# Patient Record
Sex: Female | Born: 1980 | Race: White | Hispanic: No | Marital: Married | Smoking: Never smoker
Health system: Southern US, Community
[De-identification: ages and names within clinical notes are randomized; demographics above are authoritative.]

## PROBLEM LIST (undated history)

## (undated) DIAGNOSIS — J45909 Unspecified asthma, uncomplicated: Secondary | ICD-10-CM

## (undated) DIAGNOSIS — E039 Hypothyroidism, unspecified: Secondary | ICD-10-CM

## (undated) DIAGNOSIS — Z8489 Family history of other specified conditions: Secondary | ICD-10-CM

## (undated) DIAGNOSIS — E079 Disorder of thyroid, unspecified: Secondary | ICD-10-CM

## (undated) DIAGNOSIS — G8929 Other chronic pain: Secondary | ICD-10-CM

## (undated) DIAGNOSIS — K589 Irritable bowel syndrome without diarrhea: Secondary | ICD-10-CM

## (undated) DIAGNOSIS — R51 Headache: Secondary | ICD-10-CM

## (undated) HISTORY — DX: Other chronic pain: G89.29

## (undated) HISTORY — PX: DIAGNOSTIC LAPAROSCOPY: SUR761

## (undated) HISTORY — DX: Disorder of thyroid, unspecified: E07.9

## (undated) HISTORY — DX: Headache: R51

## (undated) HISTORY — PX: OTHER SURGICAL HISTORY: SHX169

## (undated) HISTORY — DX: Irritable bowel syndrome, unspecified: K58.9

---

## 2003-02-05 ENCOUNTER — Other Ambulatory Visit: Admission: RE | Admit: 2003-02-05 | Discharge: 2003-02-05 | Payer: Self-pay | Admitting: Obstetrics and Gynecology

## 2003-08-11 ENCOUNTER — Other Ambulatory Visit: Admission: RE | Admit: 2003-08-11 | Discharge: 2003-08-11 | Payer: Self-pay | Admitting: Obstetrics and Gynecology

## 2004-05-11 ENCOUNTER — Other Ambulatory Visit: Admission: RE | Admit: 2004-05-11 | Discharge: 2004-05-11 | Payer: Self-pay | Admitting: Obstetrics and Gynecology

## 2004-11-16 ENCOUNTER — Other Ambulatory Visit: Admission: RE | Admit: 2004-11-16 | Discharge: 2004-11-16 | Payer: Self-pay | Admitting: Obstetrics and Gynecology

## 2005-06-16 ENCOUNTER — Other Ambulatory Visit: Admission: RE | Admit: 2005-06-16 | Discharge: 2005-06-16 | Payer: Self-pay | Admitting: Obstetrics and Gynecology

## 2006-07-18 ENCOUNTER — Other Ambulatory Visit: Admission: RE | Admit: 2006-07-18 | Discharge: 2006-07-18 | Payer: Self-pay | Admitting: Obstetrics & Gynecology

## 2007-08-16 ENCOUNTER — Other Ambulatory Visit: Admission: RE | Admit: 2007-08-16 | Discharge: 2007-08-16 | Payer: Self-pay | Admitting: Obstetrics and Gynecology

## 2007-09-09 ENCOUNTER — Emergency Department (HOSPITAL_COMMUNITY): Admission: EM | Admit: 2007-09-09 | Discharge: 2007-09-09 | Payer: Self-pay | Admitting: Emergency Medicine

## 2007-09-09 DIAGNOSIS — S99919A Unspecified injury of unspecified ankle, initial encounter: Secondary | ICD-10-CM

## 2007-09-09 DIAGNOSIS — S8990XA Unspecified injury of unspecified lower leg, initial encounter: Secondary | ICD-10-CM | POA: Insufficient documentation

## 2007-09-09 DIAGNOSIS — S99929A Unspecified injury of unspecified foot, initial encounter: Secondary | ICD-10-CM

## 2007-09-09 IMAGING — CR DG KNEE COMPLETE 4+V*R*
6 series · 6 of 6 positions shown · non-contrast
Comparison: none

CLINICAL DATA: Knee pain

RIGHT KNEE - 4 VIEW

[t knee ap right]
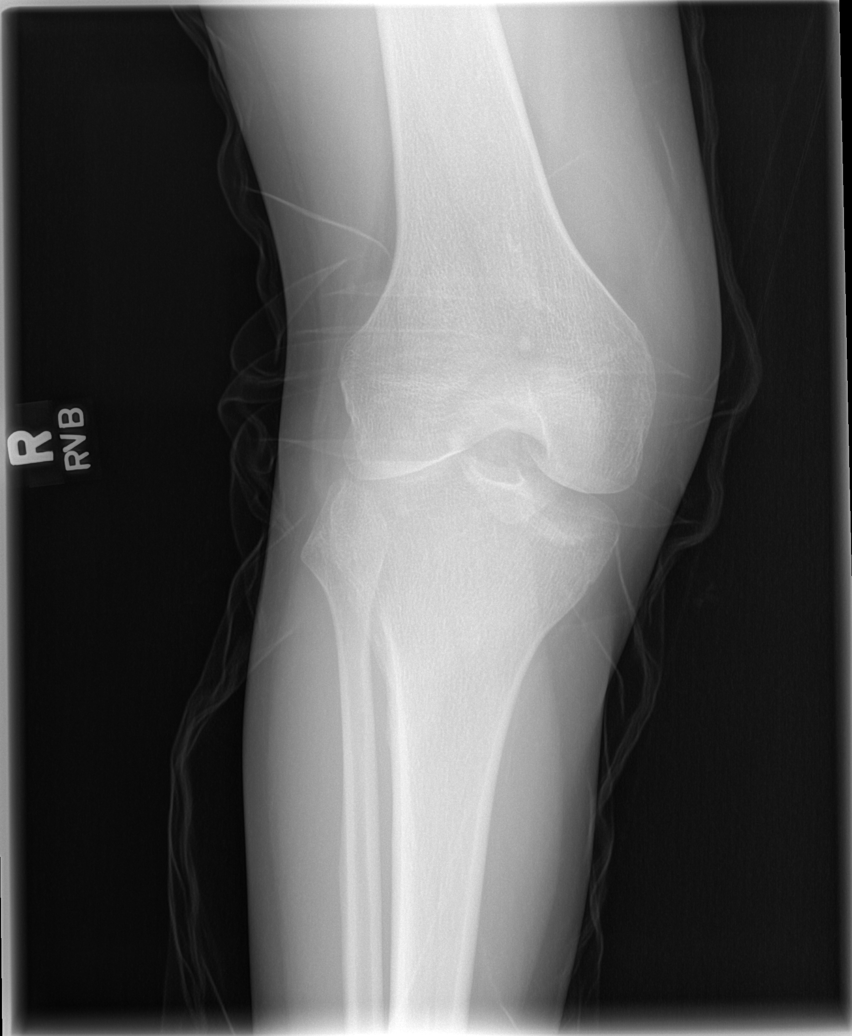

[t knee ap right * (1 of 2)]
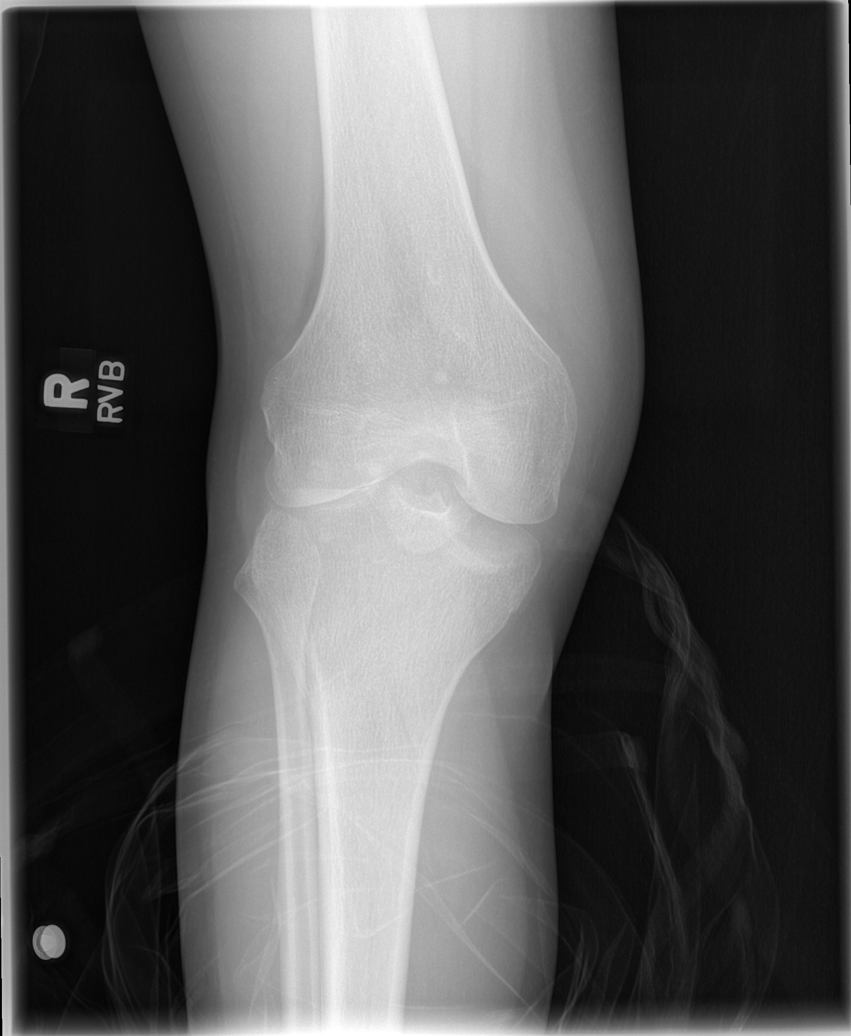

[t knee ap right * (2 of 2)]
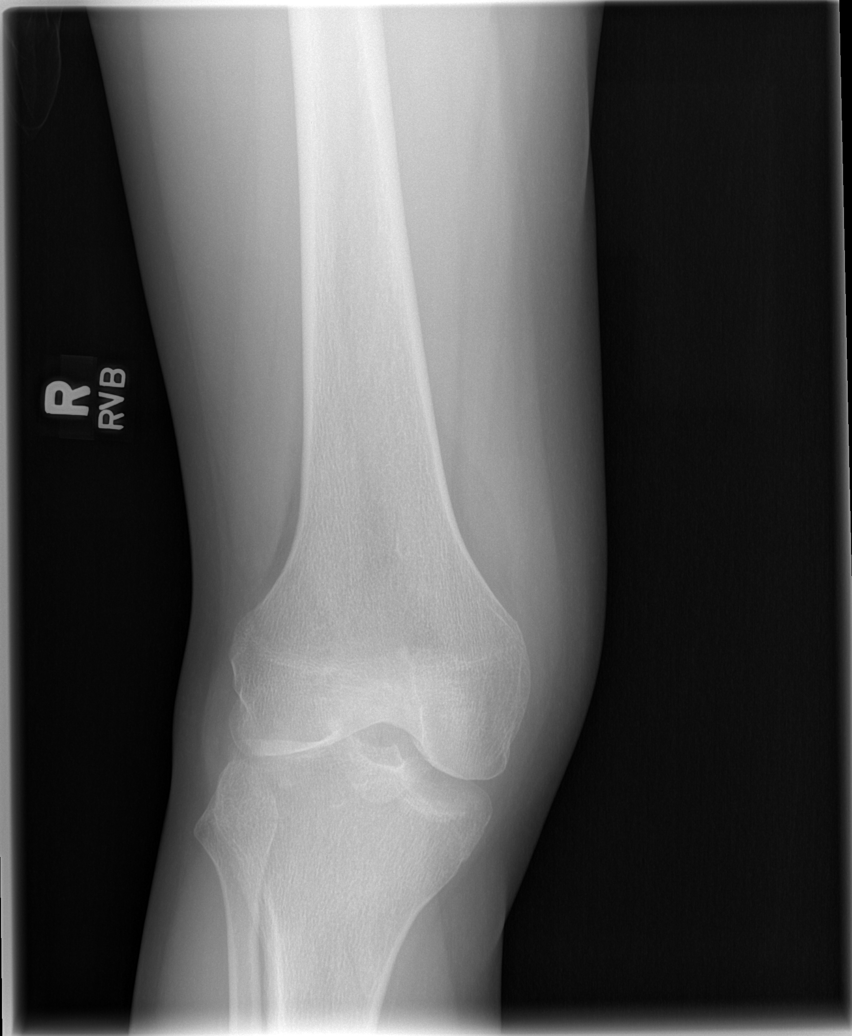

[t knee oblique right *]
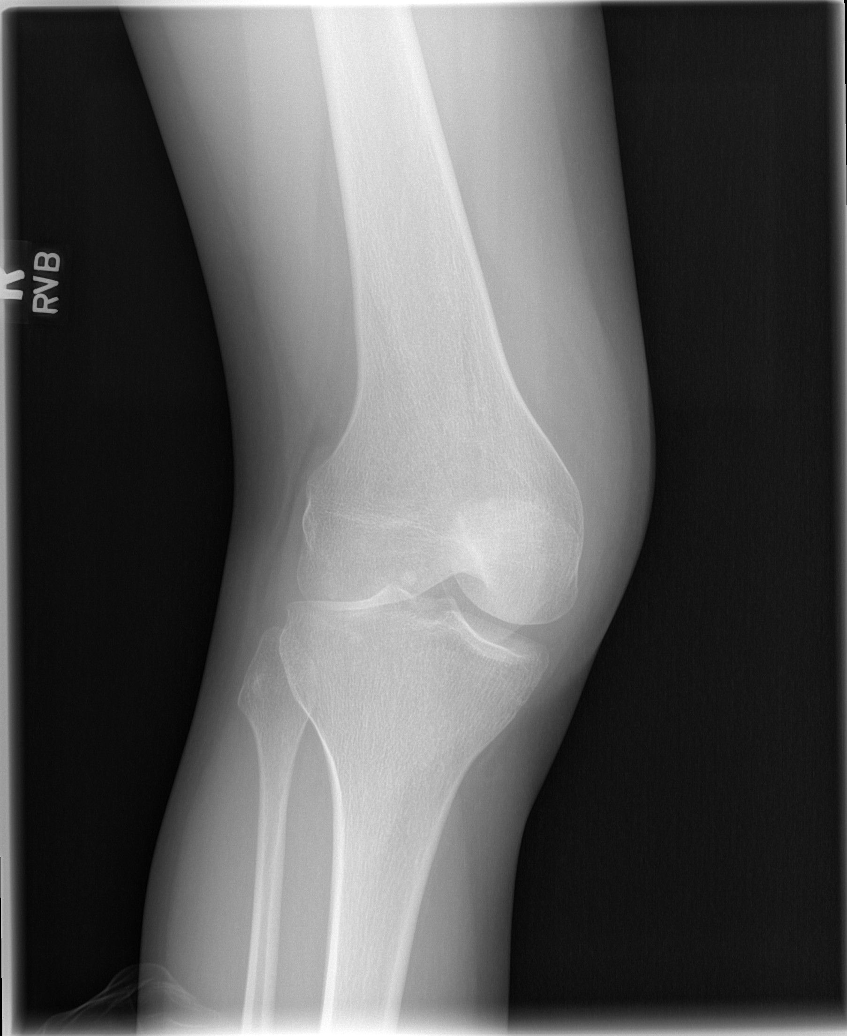

[t knee oblique right]
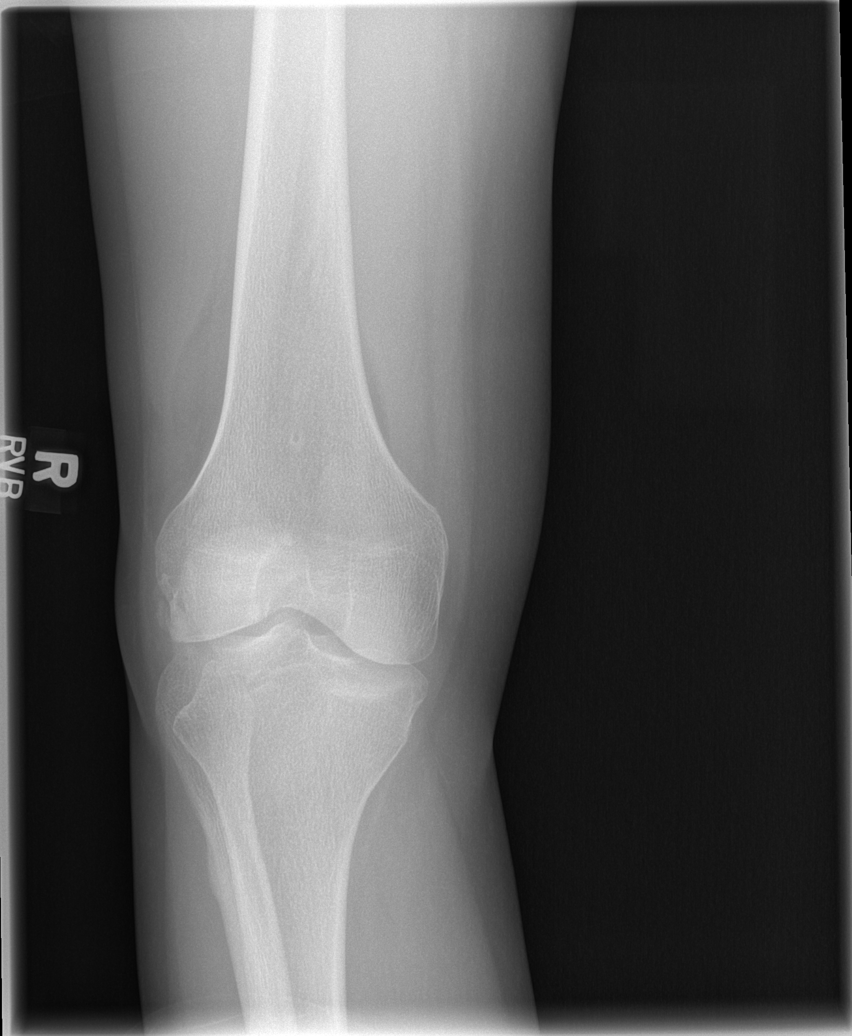

[t knee lat right *]
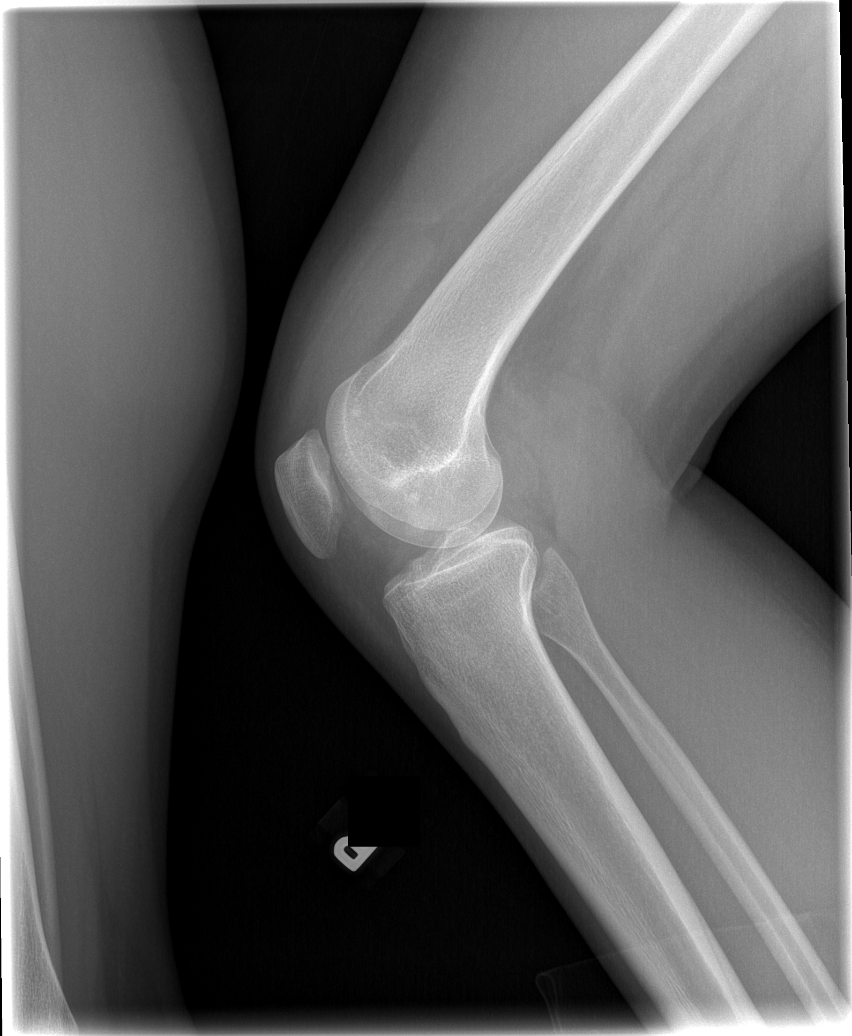

[6 of 6 positions shown; findings below may reference images not displayed]

FINDINGS: There is a small to moderate joint effusion. No acute bony
abnormality. Specifically, no fracture, subluxation, or dislocation. Soft
tissues are intact

IMPRESSION

Small to moderate joint effusion. No acute bony abnormality.

## 2008-09-30 ENCOUNTER — Other Ambulatory Visit: Admission: RE | Admit: 2008-09-30 | Discharge: 2008-09-30 | Payer: Self-pay | Admitting: Obstetrics & Gynecology

## 2009-05-27 DIAGNOSIS — F411 Generalized anxiety disorder: Secondary | ICD-10-CM | POA: Insufficient documentation

## 2009-05-27 DIAGNOSIS — E039 Hypothyroidism, unspecified: Secondary | ICD-10-CM | POA: Insufficient documentation

## 2009-05-28 ENCOUNTER — Ambulatory Visit: Payer: Self-pay | Admitting: Internal Medicine

## 2009-05-28 DIAGNOSIS — J309 Allergic rhinitis, unspecified: Secondary | ICD-10-CM | POA: Insufficient documentation

## 2009-05-28 DIAGNOSIS — I10 Essential (primary) hypertension: Secondary | ICD-10-CM | POA: Insufficient documentation

## 2009-05-28 DIAGNOSIS — Z87448 Personal history of other diseases of urinary system: Secondary | ICD-10-CM | POA: Insufficient documentation

## 2009-05-28 DIAGNOSIS — R51 Headache: Secondary | ICD-10-CM | POA: Insufficient documentation

## 2009-05-28 DIAGNOSIS — R519 Headache, unspecified: Secondary | ICD-10-CM | POA: Insufficient documentation

## 2009-05-28 DIAGNOSIS — A630 Anogenital (venereal) warts: Secondary | ICD-10-CM | POA: Insufficient documentation

## 2009-05-28 DIAGNOSIS — K219 Gastro-esophageal reflux disease without esophagitis: Secondary | ICD-10-CM | POA: Insufficient documentation

## 2009-05-28 LAB — CONVERTED CEMR LAB: Pap Smear: NORMAL

## 2009-06-09 ENCOUNTER — Telehealth: Payer: Self-pay | Admitting: Internal Medicine

## 2009-06-29 ENCOUNTER — Telehealth: Payer: Self-pay | Admitting: Internal Medicine

## 2009-07-01 ENCOUNTER — Ambulatory Visit: Payer: Self-pay | Admitting: Internal Medicine

## 2009-07-02 LAB — CONVERTED CEMR LAB
ALT: 25 units/L (ref 0–35)
AST: 28 units/L (ref 0–37)
Albumin: 3.8 g/dL (ref 3.5–5.2)
Alkaline Phosphatase: 38 units/L — ABNORMAL LOW (ref 39–117)
BUN: 9 mg/dL (ref 6–23)
Basophils Absolute: 0.1 10*3/uL (ref 0.0–0.1)
Basophils Relative: 1 % (ref 0.0–3.0)
Bilirubin Urine: NEGATIVE
Bilirubin, Direct: 0.1 mg/dL (ref 0.0–0.3)
CO2: 28 meq/L (ref 19–32)
Calcium: 8.9 mg/dL (ref 8.4–10.5)
Chloride: 103 meq/L (ref 96–112)
Cholesterol: 163 mg/dL (ref 0–200)
Creatinine, Ser: 0.6 mg/dL (ref 0.4–1.2)
Eosinophils Absolute: 0.5 10*3/uL (ref 0.0–0.7)
Eosinophils Relative: 7.1 % — ABNORMAL HIGH (ref 0.0–5.0)
GFR calc non Af Amer: 125.23 mL/min (ref >60)
Glucose, Bld: 74 mg/dL (ref 70–99)
HCT: 40.1 % (ref 36.0–46.0)
HDL: 46.1 mg/dL (ref >39.00)
Hemoglobin, Urine: NEGATIVE
Hemoglobin: 14 g/dL (ref 12.0–15.0)
Ketones, ur: NEGATIVE mg/dL
LDL Cholesterol: 91 mg/dL (ref 0–99)
Leukocytes, UA: NEGATIVE
Lymphocytes Relative: 29.5 % (ref 12.0–46.0)
Lymphs Abs: 1.9 10*3/uL (ref 0.7–4.0)
MCHC: 34.9 g/dL (ref 30.0–36.0)
MCV: 94.4 fL (ref 78.0–100.0)
Monocytes Absolute: 0.5 10*3/uL (ref 0.1–1.0)
Monocytes Relative: 6.9 % (ref 3.0–12.0)
Neutro Abs: 3.6 10*3/uL (ref 1.4–7.7)
Neutrophils Relative %: 55.5 % (ref 43.0–77.0)
Nitrite: NEGATIVE
Platelets: 149 10*3/uL — ABNORMAL LOW (ref 150.0–400.0)
Potassium: 4.5 meq/L (ref 3.5–5.1)
RBC: 4.25 M/uL (ref 3.87–5.11)
RDW: 11.6 % (ref 11.5–14.6)
Sodium: 140 meq/L (ref 135–145)
Specific Gravity, Urine: <=1.005 (ref 1.000–1.030)
TSH: 0.89 microintl units/mL (ref 0.35–5.50)
Total Bilirubin: 1.3 mg/dL — ABNORMAL HIGH (ref 0.3–1.2)
Total CHOL/HDL Ratio: 4
Total Protein, Urine: NEGATIVE mg/dL
Total Protein: 7.3 g/dL (ref 6.0–8.3)
Triglycerides: 130 mg/dL (ref 0.0–149.0)
Urine Glucose: NEGATIVE mg/dL
Urobilinogen, UA: 0.2 (ref 0.0–1.0)
VLDL: 26 mg/dL (ref 0.0–40.0)
WBC: 6.6 10*3/uL (ref 4.5–10.5)
pH: 6.5 (ref 5.0–8.0)

## 2009-07-24 ENCOUNTER — Ambulatory Visit: Payer: Self-pay | Admitting: Internal Medicine

## 2009-08-21 ENCOUNTER — Telehealth: Payer: Self-pay | Admitting: Internal Medicine

## 2009-09-28 ENCOUNTER — Telehealth: Payer: Self-pay | Admitting: Internal Medicine

## 2009-11-02 ENCOUNTER — Telehealth: Payer: Self-pay | Admitting: Internal Medicine

## 2010-10-05 NOTE — Progress Notes (Signed)
Summary: med change  Phone Note From Pharmacy   Caller: [phone] Summary of Call: Recieved a fax from pharm stating that the propranolol 10mg  is making her tired, and hard to catch her breath. want to know can she switch to ramipril 2.5mg ? Initial call taken by: Orlan Leavens,  June 09, 2009 9:52 AM  Follow-up for Phone Call        no.  we need to talk more about her BP and HAs -  ramipril (ACEI class) contraindicated in women who could become pregnant - also ramipril would not do anything for the headaches, only treats BP -  pt has OV 10/13 - we will discuss it further at that visit and try different medication for BP if needed Follow-up by: Newt Lukes MD,  June 09, 2009 10:11 AM  Additional Follow-up for Phone Call Additional follow up Details #1::        faxed back paper req with md recommendations to [facility] Additional Follow-up by: Orlan Leavens,  June 09, 2009 10:18 AM

## 2010-10-05 NOTE — Assessment & Plan Note (Signed)
Summary: NEW / [employer] / #/ CD   Vital Signs:  Patient profile:   30 year old female Height:      60.5 inches (153.67 cm) Weight:      113.2 pounds (51.45 kg) BMI:     21.82 O2 Sat:      99 % Temp:     99.2 degrees F (37.33 degrees C) oral Pulse rate:   79 / minute BP sitting:   130 / 82  (left arm) Cuff size:   regular  Vitals Entered By: Orlan Leavens (May 28, 2009 8:12 AM) CC: New patient, Headache Is Patient Diabetic? No Pain Assessment Patient in pain? no          Last PAP Date 07/06/2008 Last PAP Result Normal   Primary Care Provider:  Newt Lukes MD  CC:  New patient and Headache.  History of Present Illness: here to establish care with PCP - prev followed only with gyn  here today with complaints of headache. onset of symptoms was 1 month ago. associated with allergy and "sinus" symptoms pain located frontal and behind eyes improved by OTC antiinflammatory meds, but reccurs. worsened by nothing. headache occurs daily, presnt upon waking and present when going to bed -  headache does not wake her from dleep + prior hx of similar symptoms, but improved with change of BCP.  no migraine features such as photophobia, auras or neuro symptoms of weakness, numbness  also concerned about high blood pressure- reviewed records of BP readings at her pharmacy (automatic) SBP 129-144 and DBP 80-90 ?if related to headaches  lastly, concerned about anxiety symptoms  took lowdose zoloft in past following death of sister - helpful but decrease sexual intrest (just married) stopped zoloft herself this spring -  has slowly recognized recurrence of anxiety symptoms - ?if this related to headaches? no SI/HI no tremors or phobic behavior would like to try another med tx at this time  Preventive Screening-Counseling & Management  Alcohol-Tobacco     Smoking Status: never  Clinical Review Panels:  Prevention   Last Pap Smear:  Normal  (05/28/2009)  Immunizations   Last Tetanus Booster:  Historical (09/05/2001)   Last Flu Vaccine:  Fluvax 3+ (05/28/2009)   Last Pneumovax:  Historical (09/05/2006)   Current Medications (verified): 1)  Nortrel 1/35 (28) 1-35 Mg-Mcg Tabs (Norethindrone-Eth Estradiol) .... Take 1 By Mouth Qd 2)  Levothyroxine Sodium 112 Mcg Tabs (Levothyroxine Sodium) .... Take 1 By Mouth Qd 3)  Cyanocobalamin 1000 Mcg/ml Soln (Cyanocobalamin) .... Take 1 Injection Q Week 4)  Zyrtec Allergy 10 Mg Caps (Cetirizine Hcl) .... Take 1 By Mouth Qd 5)  Probiotic  Caps (Probiotic Product) .... Take 1 By Mouth Qd  Allergies: 1)  ! Penicillin 2)  ! Erythromycin 3)  ! Doxycycline  Past History:  Past Medical History: Anxiety Hypothyroidism Allergic rhinitis GERD  physician [facility] - [facility]  Past Surgical History: [place] fever (1985) Laproscopic in 2001  Family History: Family History of Arthritis (grandparent) Family History of Colon CA 1st degree relative <60 (grandparent) Family History Diabetes 1st degree relative (grandparent & other blood relative) Family History High cholesterol (parent & grandparent) Family History Hypertension (grandparent) Family History of Prostate CA 1st degree relative <50 (other blood realtive)  dad with PPM 2009 (age 68s)  Social History: Never Smoked recently married Nov 2009 -lives with spouse occ alcohol works as Fish farm manager at [employer] Smoking Status:  never  Review of Systems       The patient complains of headaches.  The patient denies anorexia, fever, weight loss, vision loss, decreased hearing, hoarseness, chest pain, syncope, prolonged cough, abdominal pain, incontinence, suspicious skin lesions, difficulty walking, and depression.         also see HPI above. I have reviewed all other systems and they were negative.   Physical Exam  General:  alert, well-developed, well-nourished, and cooperative to  examination.    Head:  Normocephalic and atraumatic without obvious abnormalities. No apparent alopecia or balding. Eyes:  vision grossly intact; pupils equal, round and reactive to light.  conjunctiva and lids normal.    Ears:  normal pinnae bilaterally, without erythema, swelling, or tenderness to palpation. TMs clear, without effusion, or cerumen impaction. Hearing grossly normal bilaterally  Nose:  External nasal examination shows no deformity or inflammation. Nasal mucosa are pink and moist without lesions or exudates. Mouth:  teeth and gums in good repair; mucous membranes moist, without lesions or ulcers. oropharynx clear without exudate, erythema.  Lungs:  normal respiratory effort, no intercostal retractions or use of accessory muscles; normal breath sounds bilaterally - no crackles and no wheezes.    Heart:  normal rate, regular rhythm, no murmur, and no rub. BLE without edema.  Msk:  No deformity or scoliosis noted of thoracic or lumbar spine.   Neurologic:  alert & oriented X3 and cranial nerves II-XII symetrically intact.  strength normal in all extremities, sensation intact to light touch, and gait normal. speech fluent without dysarthria or aphasia; follows commands with good comprehension.  Psych:  Oriented X3, memory intact for recent and remote, normally interactive, good eye contact, min anxious appearing, not depressed appearing, and not agitated.      Impression & Recommendations:  Problem # 1:  HYPERTENSION (ICD-401.9)  borderline readings -  may be related to anxiety and/or untreated headaches (?cause v effect) discussed options for treatment - will star with tx of other med issues 1st (see below) use as needed short acting inderal if severe symptoms occur with elevated BP and cont tx of seasonal allergies (?add nasal steroid) also keep HA journal to review at next visit Her updated medication list for this problem includes:    Propranolol Hcl 10 Mg Tabs (Propranolol  hcl) .Marland Kitchen... 1 by mouth three times a day as needed for headache or bp  BP today: 130/82  Orders: Prescription Created Electronically [id number])  Problem # 2:  ANXIETY (ICD-300.00) no reccords available re: prior tx today but will request from prior provider to review will resume med tx using SNRI in place of prior zoloft followup 1 month, ?need to titrate also need to reval mgmt of thyroid dz; again request records to review re: TSH, dosing titrations Her updated medication list for this problem includes:    Effexor Xr 37.5 Mg Xr24h-cap (Venlafaxine hcl) .Marland Kitchen... 1 by mouth once daily  Discussed medication use and relaxation techniques.   Orders: Prescription Created Electronically [phone])  Problem # 3:  HEADACHE (ICD-784.0)  neuro exam benign and no concerning features for migraine by hx may be related to anxiety and/or untreated HTN (?cause v effect) discussed options for treatment - will star with tx of other med issues 1st (see anxiety) will use as needed short acting inderal if severe HA symptoms occur with elevated BP and cont tx of seasonal allergies (?add nasal steroid) also keep HA journal to review at next visit Her updated medication list for this problem includes:  Propranolol Hcl 10 Mg Tabs (Propranolol hcl) .Marland Kitchen... 1 by mouth three times a day as needed for headache or bp  Orders: Prescription Created Electronically [id number])  Problem # 4:  ALLERGIC RHINITIS (ICD-477.9)  ?contrib to HA - consider adding nasal steroids next visit (does not take decongestants b/c hypothyroid tx) Her updated medication list for this problem includes:    Zyrtec Allergy 10 Mg Caps (Cetirizine hcl) .Marland Kitchen... Take 1 by mouth qd  Discussed use of allergy medications and environmental measures.   Problem # 5:  HYPOTHYROIDISM (ICD-244.9) follows TSH annual with gyn -  can follow here if desired at next "physical" visit -  Her updated medication list for this problem includes:    Levothyroxine  Sodium 112 Mcg Tabs (Levothyroxine sodium) .Marland Kitchen... Take 1 by mouth qd  Complete Medication List: 1)  Nortrel 1/35 (28) 1-35 Mg-mcg Tabs (Norethindrone-eth estradiol) .... Take 1 by mouth qd 2)  Levothyroxine Sodium 112 Mcg Tabs (Levothyroxine sodium) .... Take 1 by mouth qd 3)  Cyanocobalamin 1000 Mcg/ml Soln (Cyanocobalamin) .... Take 1 injection q week 4)  Zyrtec Allergy 10 Mg Caps (Cetirizine hcl) .... Take 1 by mouth qd 5)  Probiotic Caps (Probiotic product) .... Take 1 by mouth qd 6)  Effexor Xr 37.5 Mg Xr24h-cap (Venlafaxine hcl) .Marland Kitchen.. 1 by mouth once daily 7)  Propranolol Hcl 10 Mg Tabs (Propranolol hcl) .Marland Kitchen.. 1 by mouth three times a day as needed for headache or bp  Other Orders: Flu Vaccine 72yrs + (60454) Admin 1st Vaccine (09811)  Patient Instructions: 1)  will start Effexor XR 37.5 once daily for anxiety symptoms and headache 2)  monitor BP - if  BP >130/80, take propanolol as directed or if symptoms such as worsening headache 3)  keep headcahe journal to review at next visit - record time, frequency, foods, sleep, etc - anything that may be related to trigger 4)  Please schedule a follow-up appointment in 1 month, sooner if problems. Prescriptions: PROPRANOLOL HCL 10 MG TABS (PROPRANOLOL HCL) 1 by mouth three times a day as needed for headache or BP  #60 x 1   Entered and Authorized by:   Newt Lukes MD   Signed by:   Newt Lukes MD on 05/28/2009   Method used:   Electronically to        [facility]* (retail)       [address]       [place], [place]  [address]       Ph: [phone]       Fax: [phone]   RxID:   [id number] EFFEXOR XR 37.5 MG XR24H-CAP (VENLAFAXINE HCL) 1 by mouth once daily  #30 x 1   Entered and Authorized by:   Newt Lukes MD   Signed by:   Newt Lukes MD on 05/28/2009   Method used:   Electronically to        [facility]* (retail)       [address]       [place], [place]   [id number]       Ph: [phone]       Fax: [phone]   RxID:   [id number]    Immunization History:  Tetanus/Td Immunization History:    Tetanus/Td:  historical (09/05/2001)  Pneumovax Immunization History:    Pneumovax:  historical (09/05/2006)  Immunizations Administered:  Influenza Vaccine # 1:    Vaccine Type: Fluvax 3+    Site: right deltoid  Mfr: GlaxoSmithKline    Dose: 0.5 ml    Route: IM    Given by: Orlan Leavens    Exp. Date: 03/04/2010    Lot #: [id number]    VIS given: 05/28/09  Flu Vaccine Consent Questions:    Do you have a history of severe allergic reactions to this vaccine? no    Any prior history of allergic reactions to egg and/or gelatin? no    Do you have a sensitivity to the preservative Thimersol? no    Do you have a past history of Guillan-Barre Syndrome? no    Do you currently have an acute febrile illness? no    Have you ever had a severe reaction to latex? no    Vaccine information given and explained to patient? yes    Are you currently pregnant? no

## 2010-10-05 NOTE — Progress Notes (Signed)
Summary: med increase  Phone Note From Pharmacy   Caller: [facility]* Summary of Call: Received fax form pharm stating pt is requesting to increase effexor xr 37.5 to two times a day. Faxed req back stating pt need to see md before med can be increased. have appt schedule 07/24/09 if need to been seen sooner can reschedule appt Initial call taken by: Orlan Leavens,  June 29, 2009 9:09 AM

## 2010-10-05 NOTE — Progress Notes (Signed)
Summary: increase bupropion  Phone Note Call from Patient   Caller: Patient sent fax/ [phone] Reason for Call: Talk to Doctor Summary of Call: Recieved fax from pt requsting to increase Bupropion to 300mg  every other day, and then 2 once daily. she states that she is tolerating med well. If ok send new rx to  [facility] [phone] Initial call taken by: Orlan Leavens,  November 02, 2009 9:25 AM  Follow-up for Phone Call        will inc to 300mg  of extended release once daily  - new e-rx done - thanks Follow-up by: Newt Lukes MD,  November 02, 2009 9:29 AM  Additional Follow-up for Phone Call Additional follow up Details #1::        Called pt cell no ansew LMOM md ok 300mg . already sent over  to pharmacy Additional Follow-up by: Orlan Leavens,  November 02, 2009 10:46 AM    New/Updated Medications: BUPROPION HCL 300 MG XR24H-TAB (BUPROPION HCL) 1 by mouth once daily Prescriptions: BUPROPION HCL 300 MG XR24H-TAB (BUPROPION HCL) 1 by mouth once daily  #30 x 5   Entered and Authorized by:   Newt Lukes MD   Signed by:   Newt Lukes MD on 11/02/2009   Method used:   Electronically to        [facility]* (retail)       [address]       [place], [place]  [id number]       Ph: [phone]       Fax: [phone]   RxID:   [id number]

## 2010-10-05 NOTE — Progress Notes (Signed)
Summary: antidepressant - request change of med  Phone Note From Pharmacy   Caller: [facility]* Summary of Call: Pt is req to be switch to generic wellbutrin xl as she states yall had discuss at her office visit. Pls advise Initial call taken by: Orlan Leavens,  August 21, 2009 8:38 AM  Follow-up for Phone Call        pt should taper off effexor by decreasing dose to 1 cap once daily x 6 days, then 1 by mouth every other day x 6 days then stop -  when off effexor, may start bupropion xr once daily-  new rx sent to [facility] - thanks Follow-up by: Newt Lukes MD,  August 21, 2009 8:47 AM  Additional Follow-up for Phone Call Additional follow up Details #1::        called pt no ansew [facility] RTC concerning med effexor Additional Follow-up by: Orlan Leavens,  August 21, 2009 10:14 AM    Additional Follow-up for Phone Call Additional follow up Details #2::    Pt return call back gave instructions concerning effexor. Rx for welbrutrin sent to pharm already Follow-up by: Orlan Leavens,  August 21, 2009 11:43 AM  New/Updated Medications: EFFEXOR XR 37.5 MG XR24H-CAP (VENLAFAXINE HCL) 1 by mouth once daily x 6 days, then 1 cap every other day x 6 days then stop BUPROPION HCL 150 MG XR24H-TAB (BUPROPION HCL) 1 by mouth once daily Prescriptions: BUPROPION HCL 150 MG XR24H-TAB (BUPROPION HCL) 1 by mouth once daily  #30 x 2   Entered and Authorized by:   Newt Lukes MD   Signed by:   Newt Lukes MD on 08/21/2009   Method used:   Electronically to        [facility]* (retail)       [address]       [place], [place]  [id number]       Ph: [phone]       Fax: [phone]   RxID:   [id number]

## 2010-10-05 NOTE — Progress Notes (Signed)
Summary: dry cough  Phone Note Call from Patient   Caller: Patient/[phone] Call For: Newt Lukes MD Reason for Call: Talk to Doctor Summary of Call: Pt faxed over stating that she was sick with a bad cold the week before christmas. Feel completely better except still have a lingering cough. it is a dry cough. It has been 4 weeks and she has tried delsym & robitussin cough syrup. Nothing is helping. Any other suggestion she can try to take to help dry cough? Pls advise. Initial call taken by: Orlan Leavens,  September 28, 2009 9:38 AM  Follow-up for Phone Call        yes -  should take Mucinex 12h (plain, not D or DM) - one tab two times a day x 7days and then as needed  ALSO take Pepcid OTC 1 by mouth two times a day x 7days - even if not having any obvious reflux symptoms because prolonged coughing can cause silent relux to occur which can prolong the cough by irritation of the vocal cords - if still coughing after trying this, should make OV to look for other causes of cough - thanks Follow-up by: Newt Lukes MD,  September 28, 2009 9:50 AM  Additional Follow-up for Phone Call Additional follow up Details #1::        pt informed Additional Follow-up by: Margaret Pyle, CMA,  September 28, 2009 11:36 AM

## 2010-10-05 NOTE — Assessment & Plan Note (Signed)
Summary: 1 MTH CPX--PER PT  #--STC   Vital Signs:  Patient profile:   30 year old female Height:      60.5 inches (153.67 cm) Weight:      111.8 pounds (50.82 kg) O2 Sat:      97 % Temp:     98.6 degrees F (37.00 degrees C) oral Pulse rate:   83 / minute BP sitting:   120 / 90  (left arm) Cuff size:   regular  Vitals Entered By: Orlan Leavens (July 24, 2009 1:56 PM) CC: CPX Is Patient Diabetic? No Pain Assessment Patient in pain? no        Primary Care Evlyn Amason:  Newt Lukes MD  CC:  CPX.  History of Present Illness: patient is here today for annual physical. Patient feels well and has no complaints.   re: HTN - feels BP not well controlled with as needed propanolol wants to try ramipril -  on OCP and assures no plans to become pregnant as she and spouse do not want children  re: anxiety - feeling better on effexor but ready to inc dose does not yet experience dec in libido but concerned this wil occur at higher dose - ?use of wellbutrin instead  Preventive Screening-Counseling & Management  Alcohol-Tobacco     Alcohol drinks/day: <1     Alcohol Counseling: not indicated; use of alcohol is not excessive or problematic     Smoking Status: never     Tobacco Counseling: not indicated; no tobacco use  Caffeine-Diet-Exercise     Caffeine use/day: 1-2     Caffeine Counseling: not indicated; caffeine use is not excessive or problematic     Does Patient Exercise: yes     Type of exercise: run     Times/week: 4     Exercise Counseling: not indicated; exercise is adequate     Depression Counseling: not indicated; screening negative for depression  Clinical Review Panels:  Prevention   Last Pap Smear:  Normal (05/28/2009)  Immunizations   Last Tetanus Booster:  Historical (09/05/2001)   Last Flu Vaccine:  Fluvax 3+ (05/28/2009)   Last Pneumovax:  Historical (09/05/2006)  Lipid Management   Cholesterol:  163 (07/01/2009)   LDL (bad choesterol):  91  (07/01/2009)   HDL (good cholesterol):  46.10 (07/01/2009)  CBC   WBC:  6.6 (07/01/2009)   RBC:  4.25 (07/01/2009)   Hgb:  14.0 (07/01/2009)   Hct:  40.1 (07/01/2009)   Platelets:  149.0 (07/01/2009)   MCV  94.4 (07/01/2009)   MCHC  34.9 (07/01/2009)   RDW  11.6 (07/01/2009)   PMN:  55.5 (07/01/2009)   Lymphs:  29.5 (07/01/2009)   Monos:  6.9 (07/01/2009)   Eosinophils:  7.1 (07/01/2009)   Basophil:  1.0 (07/01/2009)  Complete Metabolic Panel   Glucose:  74 (07/01/2009)   Sodium:  140 (07/01/2009)   Potassium:  4.5 (07/01/2009)   Chloride:  103 (07/01/2009)   CO2:  28 (07/01/2009)   BUN:  9 (07/01/2009)   Creatinine:  0.6 (07/01/2009)   Albumin:  3.8 (07/01/2009)   Total Protein:  7.3 (07/01/2009)   Calcium:  8.9 (07/01/2009)   Total Bili:  1.3 (07/01/2009)   Alk Phos:  38 (07/01/2009)   SGPT (ALT):  25 (07/01/2009)   SGOT (AST):  28 (07/01/2009)   Current Medications (verified): 1)  Nortrel 1/35 (28) 1-35 Mg-Mcg Tabs (Norethindrone-Eth Estradiol) .... Take 1 By Mouth Qd 2)  Levothyroxine Sodium 112 Mcg Tabs (Levothyroxine Sodium) .Marland KitchenMarland KitchenMarland Kitchen  Take 1 By Mouth Qd 3)  Cyanocobalamin 1000 Mcg/ml Soln (Cyanocobalamin) .... Take 1 Injection Q Week 4)  Zyrtec Allergy 10 Mg Caps (Cetirizine Hcl) .... Take 1 By Mouth Qd 5)  Probiotic  Caps (Probiotic Product) .... Take 1 By Mouth Qd 6)  Effexor Xr 37.5 Mg Xr24h-Cap (Venlafaxine Hcl) .Marland Kitchen.. 1 By Mouth Once Daily 7)  Propranolol Hcl 10 Mg Tabs (Propranolol Hcl) .Marland Kitchen.. 1 By Mouth Three Times A Day As Needed For Headache or Bp  Allergies (verified): 1)  ! Penicillin 2)  ! Erythromycin 3)  ! Doxycycline  Past History:  Past medical, surgical, family and social histories (including risk factors) reviewed, and no changes noted (except as noted below).  Past Medical History: Reviewed history from 05/28/2009 and no changes required. Anxiety Hypothyroidism Allergic rhinitis GERD  physician [facility] - [facility] [place] [facility] [place]  [facility]  Past Surgical History: Reviewed history from 05/28/2009 and no changes required. [place] fever (1985) Laproscopic in 2001  Family History: Reviewed history from 05/28/2009 and no changes required. Family History of Arthritis (grandparent) Family History of Colon CA 1st degree relative <60 (grandparent) Family History Diabetes 1st degree relative (grandparent & other blood relative) Family History High cholesterol (parent & grandparent) Family History Hypertension (grandparent) Family History of Prostate CA 1st degree relative <50 (other blood realtive)  dad with PPM 2009 (age 7s)  Social History: Reviewed history from 05/28/2009 and no changes required. Never Smoked recently married Nov 2009 -lives with spouse occ alcohol works as Fish farm manager at [employer] Caffeine use/day:  1-2 Does Patient Exercise:  yes  Review of Systems       see HPI above. I have reviewed all other systems and they were negative.   Physical Exam  General:  alert, well-developed, well-nourished, and cooperative to examination.    Eyes:  vision grossly intact; pupils equal, round and reactive to light.  conjunctiva and lids normal.    Ears:  normal pinnae bilaterally, without erythema, swelling, or tenderness to palpation. TMs clear, without effusion, or cerumen impaction. Hearing grossly normal bilaterally  Mouth:  teeth and gums in good repair; mucous membranes moist, without lesions or ulcers. oropharynx clear without exudate, erythema.  Lungs:  normal respiratory effort, no intercostal retractions or use of accessory muscles; normal breath sounds bilaterally - no crackles and no wheezes.    Heart:  normal rate, regular rhythm, no murmur, and no rub. BLE without edema.  Abdomen:  soft, non-tender, normal bowel sounds, no distention; no masses and no appreciable hepatomegaly or splenomegaly.   Genitalia:  defer gyn Msk:  No deformity or scoliosis noted of thoracic or lumbar spine.     Neurologic:  alert & oriented X3 and cranial nerves II-XII symetrically intact.  strength normal in all extremities, sensation intact to light touch, and gait normal. speech fluent without dysarthria or aphasia; follows commands with good comprehension.  Skin:  no rashes, vesicles, ulcers, or erythema. No nodules or irregularity to palpation.  Psych:  Oriented X3, memory intact for recent and remote, normally interactive, good eye contact, less anxious appearing, not depressed appearing, and not agitated.      Impression & Recommendations:  Problem # 1:  PREVENTIVE HEALTH CARE (ICD-V70.0) Patient has been counseled on age-appropriate routine health concerns for screening and prevention.  These are reviewed and up-to-date. Immunizations are up-to-date or declined. Labs reviewed.   Problem # 2:  HYPERTENSION (ICD-401.9)  pt expresses intrest in ramapril - reviewed need to  avoid pregnancy while on this med pt on OCP - she and spouse do not plan kids - understands awareness of risk to fetuts if becomes pregannat on this med therefore, will try and monitor BP - plan f/u to monitor Her updated medication list for this problem includes:    Propranolol Hcl 10 Mg Tabs (Propranolol hcl) .Marland Kitchen... 1 by mouth three times a day as needed for headache or bp    Ramipril 2.5 Mg Caps (Ramipril) .Marland Kitchen... 1 by mouth once daily  BP today: 120/90 Prior BP: 130/82 (05/28/2009)  Labs Reviewed: K+: 4.5 (07/01/2009) Creat: : 0.6 (07/01/2009)   Chol: 163 (07/01/2009)   HDL: 46.10 (07/01/2009)   LDL: 91 (07/01/2009)   TG: 130.0 (07/01/2009)  Orders: Prescription Created Electronically [phone])  Problem # 3:  ANXIETY (ICD-300.00)  inc dose  for improved effectiveness if intol Se (libido issues), would like ot consider trying wellbutrin - will follow Her updated medication list for this problem includes:    Effexor Xr 37.5 Mg Xr24h-cap (Venlafaxine hcl) .Marland Kitchen... 1 by mouth once two times a day  Orders: Prescription  Created Electronically [phone])  Problem # 4:  HYPOTHYROIDISM (ICD-244.9)  normal TSH - cont same Her updated medication list for this problem includes:    Levothyroxine Sodium 112 Mcg Tabs (Levothyroxine sodium) .Marland Kitchen... Take 1 by mouth qd  Labs Reviewed: TSH: 0.89 (07/01/2009)    Chol: 163 (07/01/2009)   HDL: 46.10 (07/01/2009)   LDL: 91 (07/01/2009)   TG: 130.0 (07/01/2009)  Complete Medication List: 1)  Nortrel 1/35 (28) 1-35 Mg-mcg Tabs (Norethindrone-eth estradiol) .... Take 1 by mouth qd 2)  Levothyroxine Sodium 112 Mcg Tabs (Levothyroxine sodium) .... Take 1 by mouth qd 3)  Cyanocobalamin 1000 Mcg/ml Soln (Cyanocobalamin) .... Take 1 injection q week 4)  Zyrtec Allergy 10 Mg Caps (Cetirizine hcl) .... Take 1 by mouth qd 5)  Probiotic Caps (Probiotic product) .... Take 1 by mouth qd 6)  Effexor Xr 37.5 Mg Xr24h-cap (Venlafaxine hcl) .Marland Kitchen.. 1 by mouth once two times a day 7)  Propranolol Hcl 10 Mg Tabs (Propranolol hcl) .Marland Kitchen.. 1 by mouth three times a day as needed for headache or bp 8)  Ramipril 2.5 Mg Caps (Ramipril) .Marland Kitchen.. 1 by mouth once daily  Patient Instructions: 1)  it was good to see you today.  2)  labs reviewed - they look normal! 3)  medication changes as discussed - do not get pregnant while on ramipril 4)  Please schedule a follow-up appointment in 3 months to follow blood  pressure and anxiety, sooner if problems.  Prescriptions: EFFEXOR XR 37.5 MG XR24H-CAP (VENLAFAXINE HCL) 1 by mouth once two times a day  #60 x 3   Entered and Authorized by:   Newt Lukes MD   Signed by:   Newt Lukes MD on 07/24/2009   Method used:   Electronically to        [facility]* (retail)       [address]       [place], [place]  [id number]       Ph: [phone]       Fax: [phone]   RxID:   [id number] RAMIPRIL 2.5 MG CAPS (RAMIPRIL) 1 by mouth once daily  #30 x 3   Entered and Authorized by:   Newt Lukes MD   Signed by:   Newt Lukes MD on 07/24/2009   Method used:   Electronically to  [facility]* (retail)       [address]       [place], [place]  [id number]       Ph: [phone]       Fax: [phone]   RxID:   [id number]

## 2011-01-14 ENCOUNTER — Other Ambulatory Visit: Payer: Self-pay | Admitting: Internal Medicine

## 2011-01-18 ENCOUNTER — Ambulatory Visit
Admission: RE | Admit: 2011-01-18 | Discharge: 2011-01-18 | Disposition: A | Discharge status: Home / Self Care | Payer: BC Managed Care – PPO | Source: Ambulatory Visit | Attending: Internal Medicine | Admitting: Internal Medicine

## 2011-01-18 IMAGING — US US ABDOMEN COMPLETE
1 series · 14 of 25 positions shown · non-contrast
Comparison: None.

CLINICAL DATA: Abdominal pain, nausea and vomiting

COMPLETE ABDOMINAL ULTRASOUND

[Series 1: us abdomen complete · 0.20mm/px · 14 of 83 slices shown]
[im 1/83]
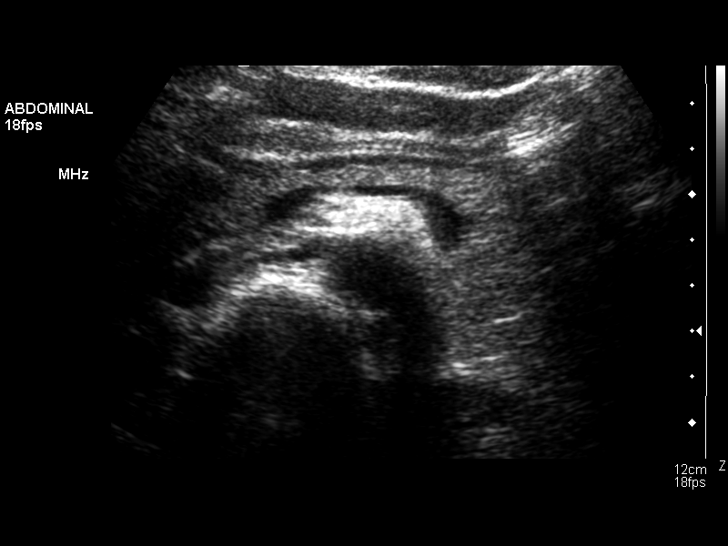
[im 7/83]
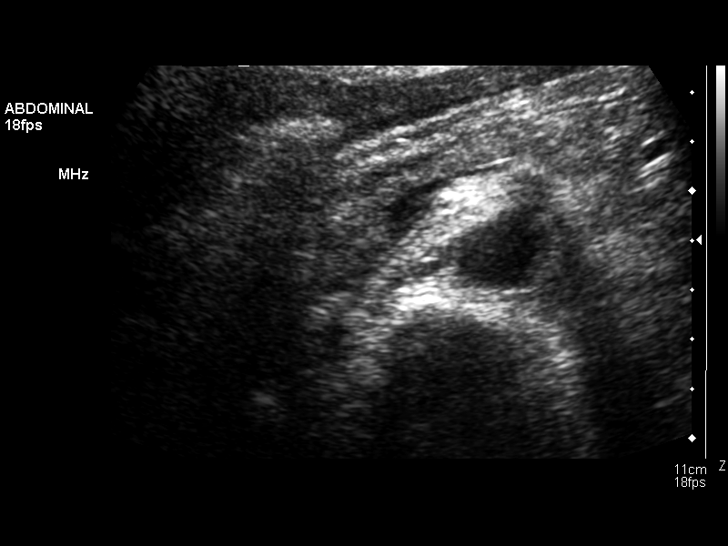
[im 14/83]
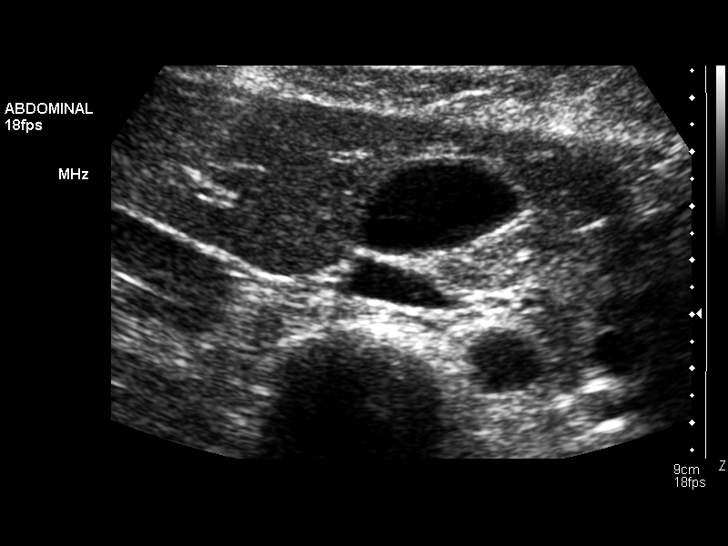
[im 21/83]
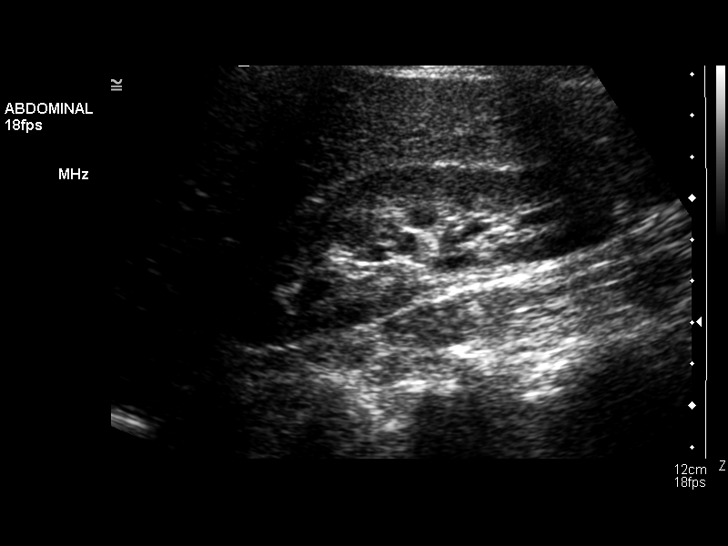
[im 28/83]
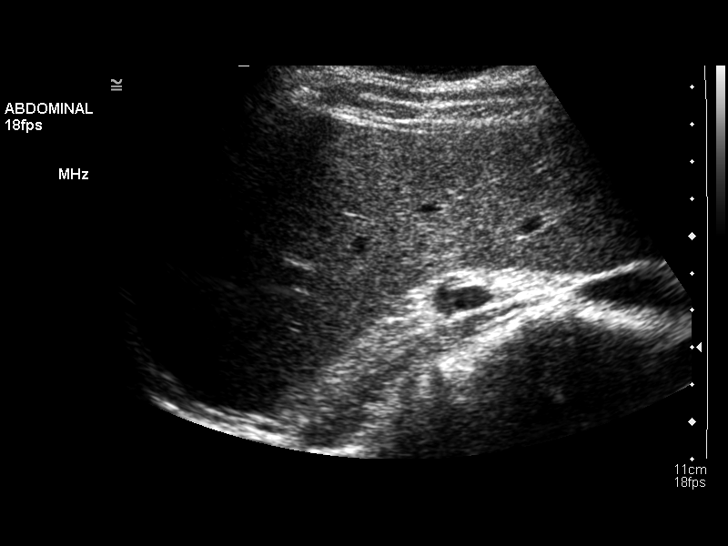
[im 31/83]
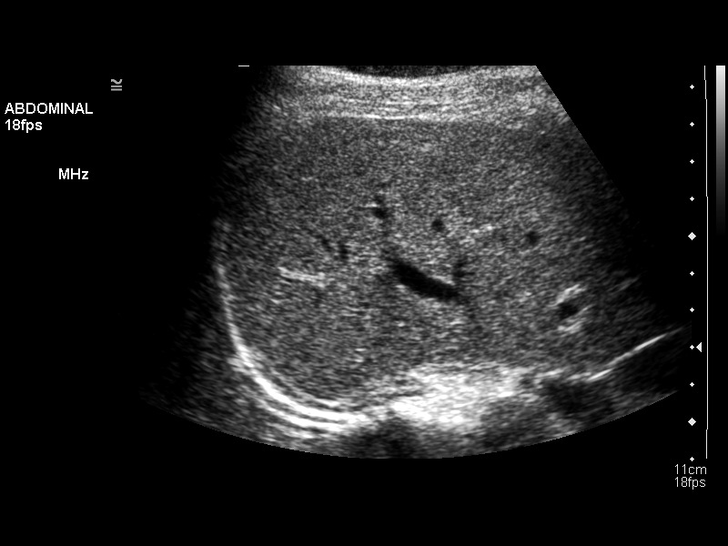
[im 38/83]
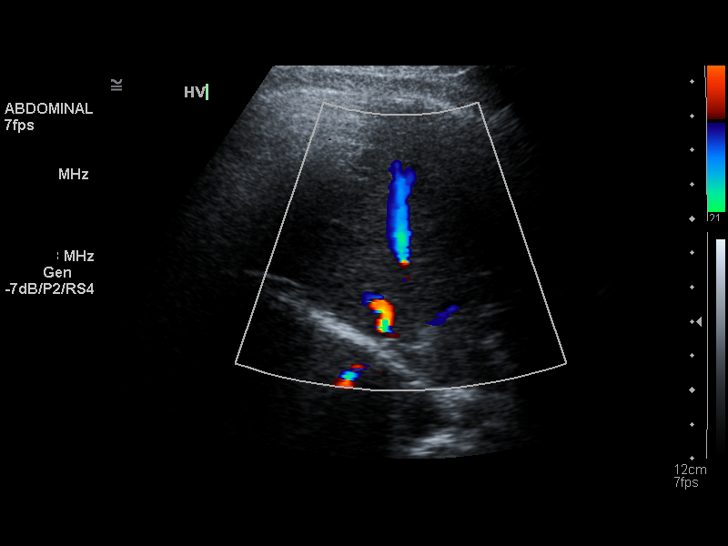
[im 45/83]
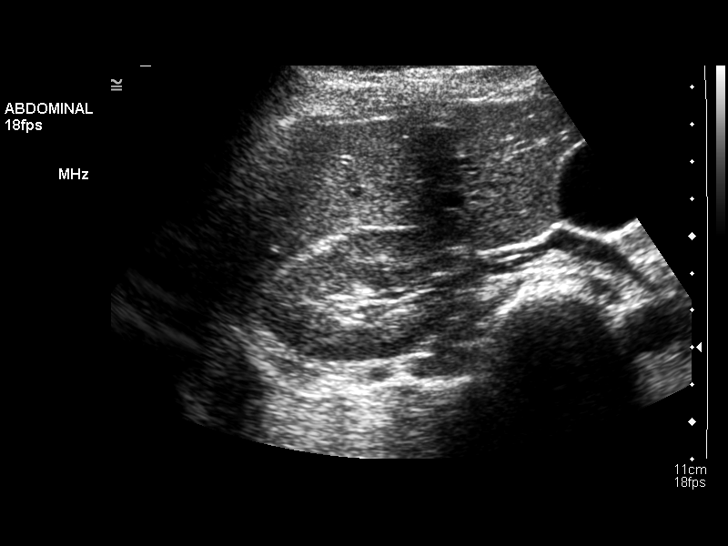
[im 52/83]
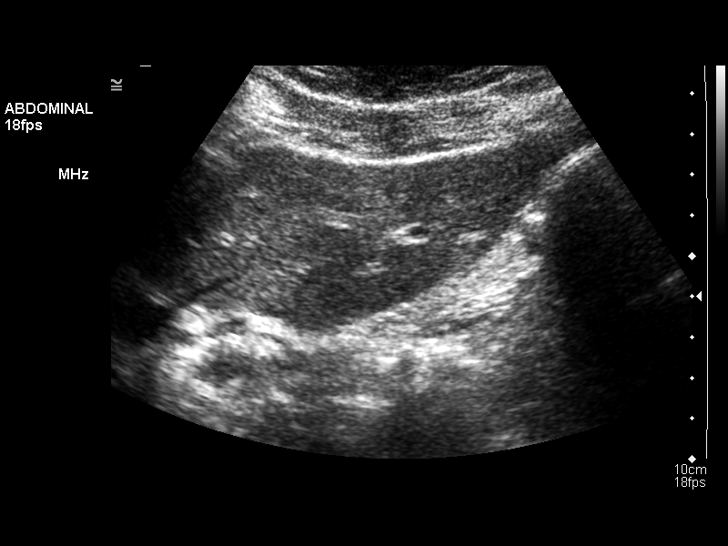
[im 55/83]
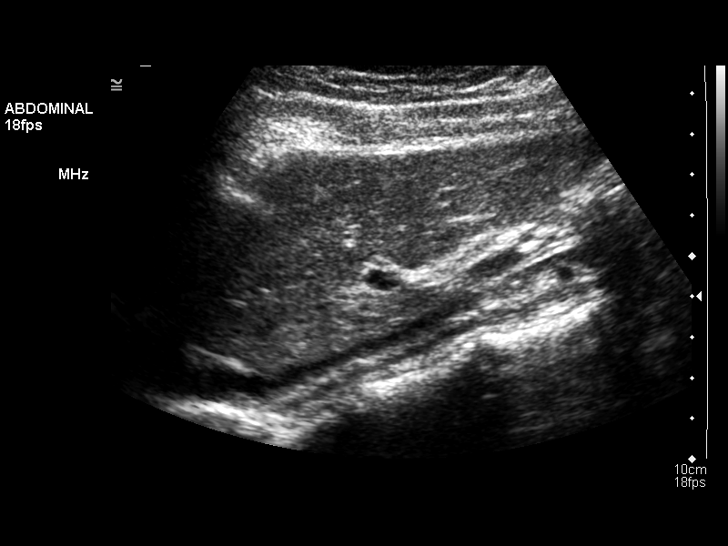
[im 62/83]
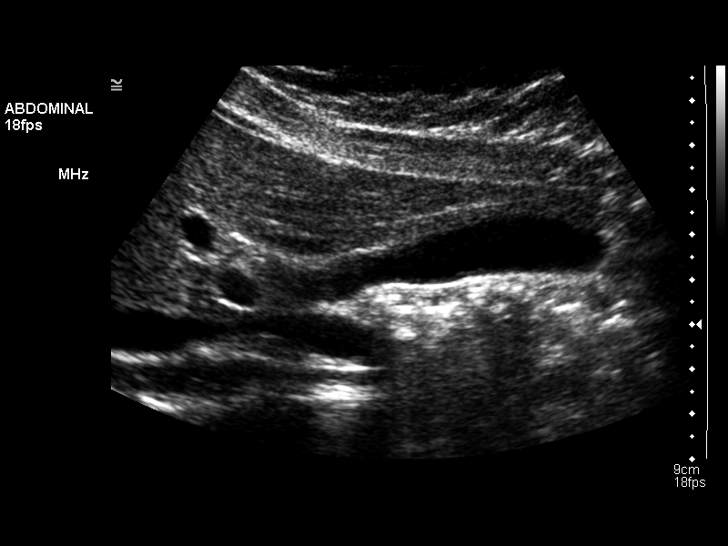
[im 69/83]
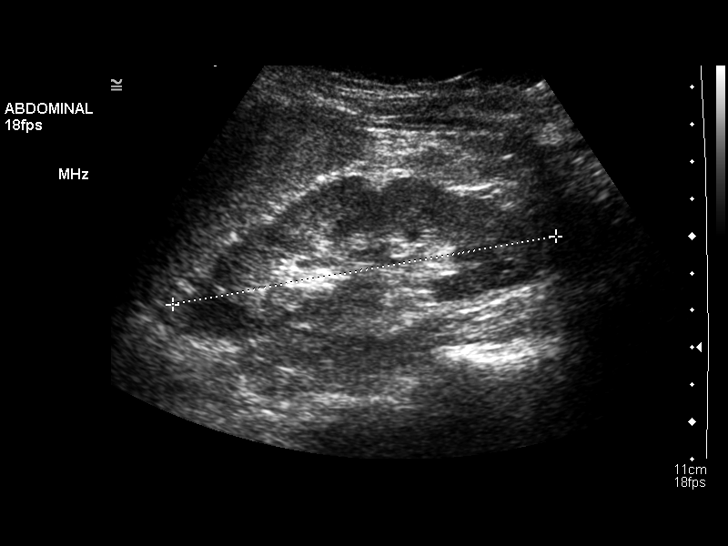
[im 76/83]
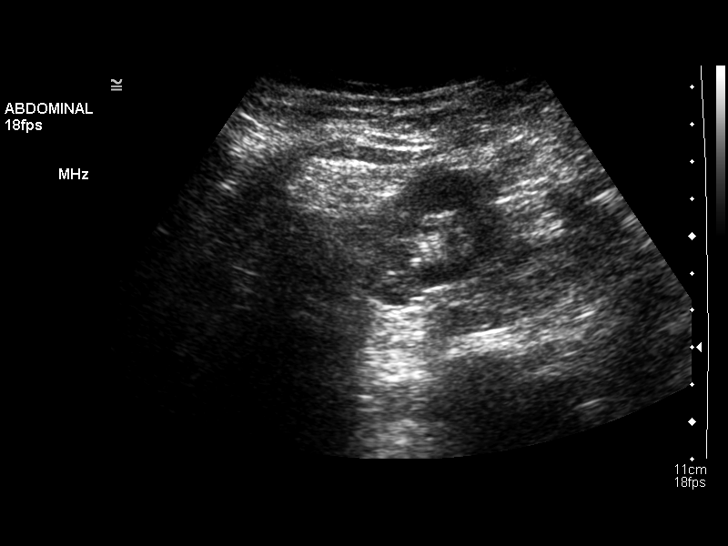
[im 83/83]
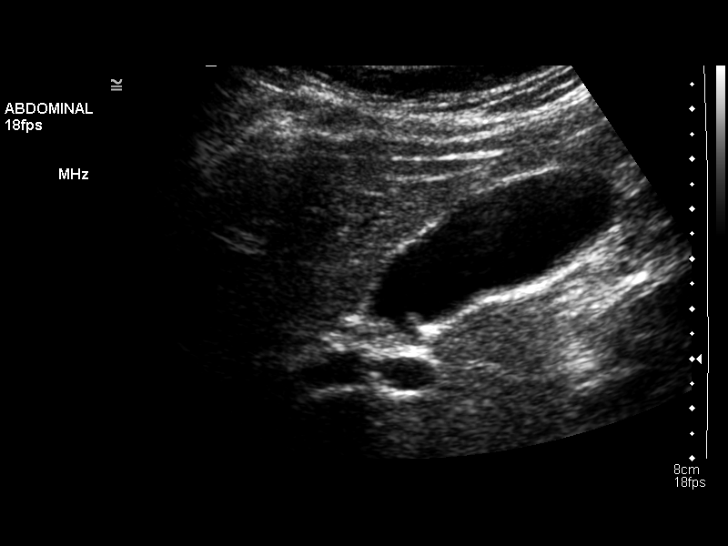

[14 of 25 positions shown; findings below may reference images not displayed]

FINDINGS: Gallbladder:  The gallbladder is visualized and no gallstones are
noted.  There is no pain over gallbladder with compression

Common bile duct:  .  The common bile duct is normal measuring
mm in diameter.

Liver:  The liver has a normal echogenic pattern.  No ductal
dilatation is seen.

IVC:  Appears normal.

Pancreas:  No focal abnormality seen.

Spleen:  The spleen is normal measuring 7.2 cm sagittally.

Right Kidney:  No hydronephrosis is noted.  The right kidney
measures 10.8 cm sagittally.

Left Kidney:  No hydronephrosis.  The left kidney measures 10.5 cm.

Abdominal aorta:  The abdominal aorta is normal in caliber.
IMPRESSION: 1.  No gallstones.  No ductal dilatation.
2.  No hydronephrosis.

## 2011-01-19 ENCOUNTER — Telehealth: Payer: Self-pay

## 2011-01-19 ENCOUNTER — Telehealth: Payer: Self-pay | Admitting: Gastroenterology

## 2011-01-19 NOTE — Telephone Encounter (Signed)
Pt scheduled for New visit on 02/08/11 and put on the wait list.  Pt aware and new pt letter mailed

## 2011-01-19 NOTE — Telephone Encounter (Signed)
Dr Christella Hartigan wants New appt scheduled pt aware

## 2011-01-20 ENCOUNTER — Ambulatory Visit (HOSPITAL_COMMUNITY): Admission: RE | Admit: 2011-01-20 | Payer: BC Managed Care – PPO | Source: Ambulatory Visit | Admitting: General Surgery

## 2011-02-08 ENCOUNTER — Ambulatory Visit (INDEPENDENT_AMBULATORY_CARE_PROVIDER_SITE_OTHER): Payer: BC Managed Care – PPO | Admitting: Gastroenterology

## 2011-02-08 ENCOUNTER — Encounter: Payer: Self-pay | Admitting: Gastroenterology

## 2011-02-08 DIAGNOSIS — R109 Unspecified abdominal pain: Secondary | ICD-10-CM

## 2011-02-08 NOTE — Progress Notes (Signed)
HPI: This is a  very pleasant 30 year old woman  Who was bothered by abd pains, started early May.  Epigstric, dull, lingering pain that intesified.  Intermittent but lasted overall 3 weeks.  Episodes lasted 15 min to 3 hours.  + associated nausea but no vomitting.  Pain was worse after eating.  Could also wake her from sleep.  She cut out dairy, caffeine, sugars, fats.. No real difference.  Was taking NSAIDs from March, April daily to QOD would take 600mg  advil 2-3 times.   She gets pyrosis, takes sodium bicarb, never h2 blocker or ppi. Was put on aciphex by pcp bid, no real changes.  Cbc, cmet, [place] recently were all normal.  Remote 2004 HIDA was slightly abnormal.   she has felt fine for 10 days now.   Review of systems: Pertinent positive and negative review of systems were noted in the above HPI section.  All other review of systems was otherwise negative.   Past Medical History, Past Surgical History, Family History, Social History, Current Medications, Allergies were all reviewed with the patient via [facility] electronic medical record system.   Physical Exam: BP 110/78  Pulse 60  Ht 5' (1.524 m)  Wt 112 lb 3.2 oz (50.894 kg)  BMI 21.91 kg/m2  LMP 11/08/2010 Constitutional: generally well-appearing Psychiatric: alert and oriented x3 Eyes: extraocular movements intact Mouth: oral pharynx moist, no lesions Neck: supple no lymphadenopathy Cardiovascular: heart regular rate and rhythm Lungs: clear to auscultation bilaterally Abdomen: soft, nontender, nondistended, no obvious ascites, no peritoneal signs, normal bowel sounds Extremities: no lower extremity edema bilaterally Skin: no lesions on visible extremities    Assessment and plan: 30 y.o. female with recent intermittent epigastric pain.  Her pains do sound biliary in nature. Her hiatus scan was abnormal, slightly about 7 years ago and I would like to repeat that now. She understandably wants a bit more convincing  evidence prior to committing to gallbladder surgery. If the HIDA scan is clearly abnormal and we will send her back to see a surgeon at [facility] surgery. If it is very normal, then I would have to consider upper endoscopy. She was taking NSAIDs prior to her pains.

## 2011-02-08 NOTE — Patient Instructions (Addendum)
HIDA with [facility] to check for biliary dysfunction.  If this is very positive, will set you up to meet different surgeon at [facility].  Cassandra Jefferson Arrive on 03/04/11 at 745 am nothing to eat or drink after midnight. A copy of this information will be made available to Dr. Ricki Miller.

## 2011-02-11 ENCOUNTER — Telehealth: Payer: Self-pay | Admitting: Gastroenterology

## 2011-02-11 NOTE — Telephone Encounter (Signed)
Pt aware.

## 2011-02-11 NOTE — Telephone Encounter (Signed)
Pt is having abd pain and is scheduled for HIDA  On 02/24/11.  What else can she do until the HIDA is done?  She is calling [facility] to see if they can do the scan quicker.  Please advise

## 2011-02-11 NOTE — Telephone Encounter (Signed)
Bland, low fat diet

## 2011-02-23 ENCOUNTER — Encounter (HOSPITAL_COMMUNITY)
Admission: RE | Admit: 2011-02-23 | Discharge: 2011-02-23 | Disposition: A | Discharge status: Home / Self Care | Payer: BC Managed Care – PPO | Source: Ambulatory Visit | Attending: Gastroenterology | Admitting: Gastroenterology

## 2011-02-23 ENCOUNTER — Telehealth: Payer: Self-pay | Admitting: Gastroenterology

## 2011-02-23 DIAGNOSIS — R109 Unspecified abdominal pain: Secondary | ICD-10-CM | POA: Insufficient documentation

## 2011-02-23 IMAGING — NM NM HEPATO W/GB/PHARM/[PERSON_NAME]
3 series · 13 of 13 positions shown · non-contrast
Comparison: none

CLINICAL DATA: Abdominal pain, nausea.  Negative ultrasound.

[he hepatobiliary · 3.43mm/px · 6 of 41 frames shown (1 of 3)]
[frame 4/41]
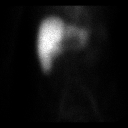
[frame 10/41]
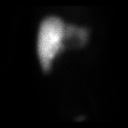
[frame 17/41]
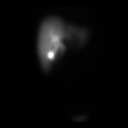
[frame 24/41]
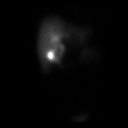
[frame 31/41]
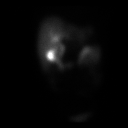
[frame 38/41]
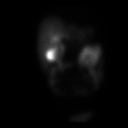

[he hepatobiliary · 1 of 1 slices shown (2 of 3)]
[im 1/1]
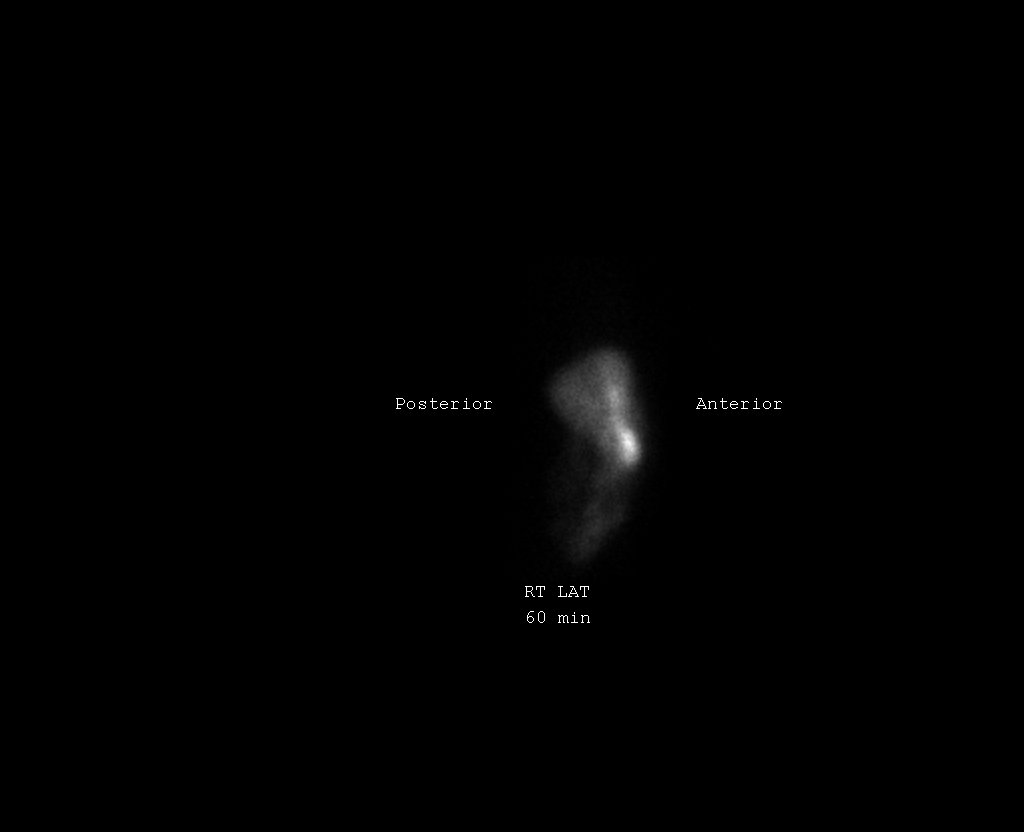

[he hepatobiliary · 3.43mm/px · 6 of 30 frames shown (3 of 3)]
[frame 3/30]
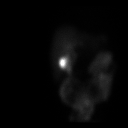
[frame 8/30]
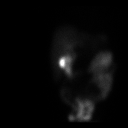
[frame 13/30]
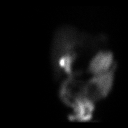
[frame 18/30]
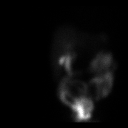
[frame 23/30]
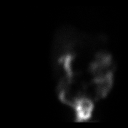
[frame 28/30]
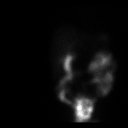

[13 of 13 positions shown; findings below may reference images not displayed]

HEPATOBILIARY SCINTIGRAPHY WITH EJECTION FRACTION

Anterior imaging after [U4] [U4] Choletec IV. There is prompt
clearance of the radiopharmaceutical from the blood pool. Timely
visualization of activity in central bile ducts, small bowel, and
gallbladder.
After 1 hour,0.95 mcg CCK was infused intravenously and imaging
continued. No symptoms with infusion. The calculated gallbladder
minutes (Ziessman et al., [U4] J. Nuclear Med).

IMPRESSION
1. Patency of cystic and common bile ducts.
2. Normal gallbladder ejection fraction.

## 2011-02-23 MED ORDER — TECHNETIUM TC 99M MEBROFENIN IV KIT
5.0000 | PACK | Freq: Once | INTRAVENOUS | Status: AC | PRN
  Administered 2011-02-23: 5 via INTRAVENOUS

## 2011-02-23 NOTE — Telephone Encounter (Signed)
Pt aware that the results sent to Dr Donell Beers

## 2011-02-24 ENCOUNTER — Encounter: Payer: Self-pay | Admitting: Gastroenterology

## 2011-02-24 ENCOUNTER — Telehealth: Payer: Self-pay | Admitting: Gastroenterology

## 2011-02-24 NOTE — Telephone Encounter (Signed)
Pt is aware she can restart the aciphex.

## 2011-03-03 ENCOUNTER — Ambulatory Visit (AMBULATORY_SURGERY_CENTER): Payer: BC Managed Care – PPO | Admitting: *Deleted

## 2011-03-03 VITALS — Ht 60.0 in | Wt 110.0 lb

## 2011-03-03 DIAGNOSIS — R109 Unspecified abdominal pain: Secondary | ICD-10-CM

## 2011-03-04 ENCOUNTER — Inpatient Hospital Stay (HOSPITAL_COMMUNITY)
Admission: RE | Admit: 2011-03-04 | Discharge: 2011-03-04 | Payer: BC Managed Care – PPO | Source: Ambulatory Visit | Attending: Gastroenterology | Admitting: Gastroenterology

## 2011-03-04 ENCOUNTER — Encounter: Payer: Self-pay | Admitting: Gastroenterology

## 2011-03-07 ENCOUNTER — Other Ambulatory Visit: Payer: Self-pay

## 2011-03-07 MED ORDER — PANTOPRAZOLE SODIUM 40 MG PO TBEC: 40.0000 mg | DELAYED_RELEASE_TABLET | Freq: Every day | ORAL | Status: DC

## 2011-03-16 ENCOUNTER — Encounter: Payer: Self-pay | Admitting: Gastroenterology

## 2011-03-16 ENCOUNTER — Ambulatory Visit (AMBULATORY_SURGERY_CENTER): Payer: BC Managed Care – PPO | Admitting: Gastroenterology

## 2011-03-16 VITALS — BP 128/83 | HR 84 | Temp 97.3°F | Resp 20 | Ht 60.0 in | Wt 105.0 lb

## 2011-03-16 DIAGNOSIS — R1013 Epigastric pain: Secondary | ICD-10-CM

## 2011-03-16 DIAGNOSIS — K2970 Gastritis, unspecified, without bleeding: Secondary | ICD-10-CM

## 2011-03-16 DIAGNOSIS — K2990 Gastroduodenitis, unspecified, without bleeding: Secondary | ICD-10-CM

## 2011-03-16 DIAGNOSIS — R109 Unspecified abdominal pain: Secondary | ICD-10-CM

## 2011-03-16 MED ORDER — SODIUM CHLORIDE 0.9 % IV SOLN: 500.0000 mL | INTRAVENOUS | Status: DC

## 2011-03-16 NOTE — Patient Instructions (Signed)
Mild gastritis so biopsies were taken to check for H.pylori bacteria If found appropriate antibiotics will be ordered If not, will consider small bowel follow through test to look at small intestine Please start keeping a "food diary"

## 2011-03-17 ENCOUNTER — Telehealth: Payer: Self-pay | Admitting: *Deleted

## 2011-03-17 NOTE — Telephone Encounter (Signed)

## 2011-03-24 ENCOUNTER — Telehealth: Payer: Self-pay | Admitting: Gastroenterology

## 2011-03-24 NOTE — Telephone Encounter (Signed)
Notified pt she was negative for H. Pylori; path showed mild chronic gastritis and no intestinal metaplastic hyperplasia or malignancy. Per her EGD, Dr Christella Hartigan wrote if H.Pylori is negative, he will consider Small Bowel Follow Through to image small intestine; pt stated understanding. Dr Christella Hartigan, please advise. Thanks.

## 2011-03-27 NOTE — Telephone Encounter (Signed)
Yes, please set her up with SBFT test.  Dx: abd pains

## 2011-03-28 NOTE — Telephone Encounter (Signed)
lmom for pt to call back. Pt scheduled for SBFT at [facility] on 03/31/11 at 0900am; NPO after midnight.

## 2011-03-29 NOTE — Telephone Encounter (Signed)
Notified pt of her appt for SBFT. Pt cannit make this appt; she will call scheduling, [phone] and r/s.

## 2011-03-31 ENCOUNTER — Telehealth: Payer: Self-pay | Admitting: Gastroenterology

## 2011-03-31 ENCOUNTER — Other Ambulatory Visit (HOSPITAL_COMMUNITY): Payer: BC Managed Care – PPO

## 2011-03-31 NOTE — Telephone Encounter (Signed)
Pt had questions regarding the cost of the SBFT.  I gave her the number of [facility] and advised her that they could help her with her billing questions.  She thanked me for helping

## 2011-04-08 ENCOUNTER — Ambulatory Visit (HOSPITAL_COMMUNITY)
Admission: RE | Admit: 2011-04-08 | Discharge: 2011-04-08 | Disposition: A | Discharge status: Home / Self Care | Payer: BC Managed Care – PPO | Source: Ambulatory Visit | Attending: Gastroenterology | Admitting: Gastroenterology

## 2011-04-08 DIAGNOSIS — R109 Unspecified abdominal pain: Secondary | ICD-10-CM | POA: Insufficient documentation

## 2011-04-08 IMAGING — RF DG SMALL BOWEL
10 series · 10 of 10 positions shown · non-contrast
Comparison: None.

CLINICAL DATA: 30-year-old female with abdominal pain for 3
months.

SMALL BOWEL SERIES
TECHNIQUE: Following ingestion of a mixture of thin barium and
Entero Vu, serial small bowel images were obtained including spot
views of the terminal ileum.
Fluoroscopy time:  2.7 minutes.

[Series 1: run · 1 of 1 slices shown (1 of 10)]
[im 1/1]
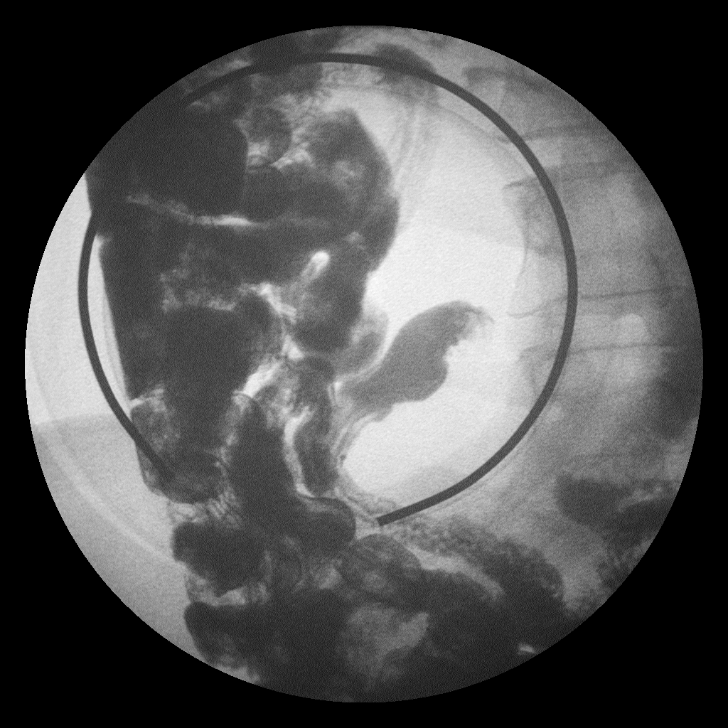

[Series 2: run · 1 of 1 slices shown (2 of 10)]
[im 1/1]
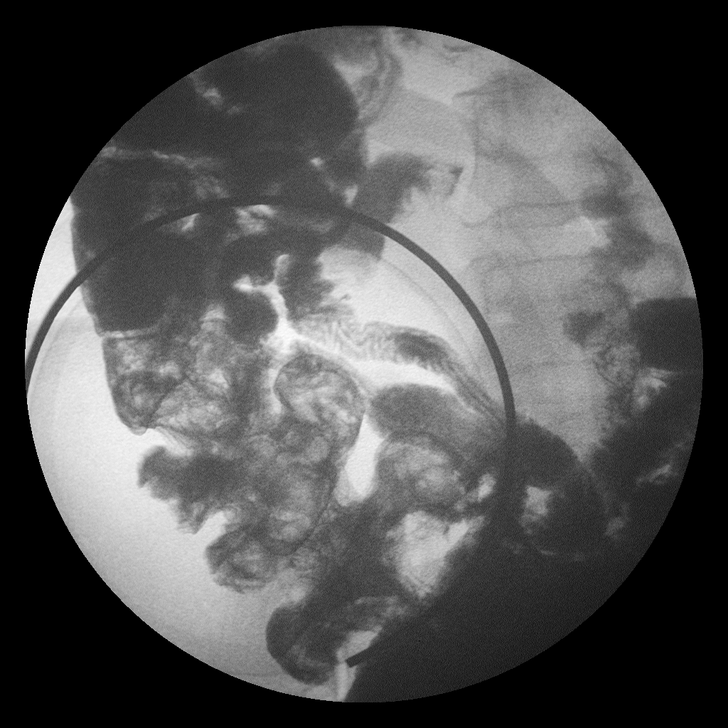

[Series 3: run · 1 of 1 slices shown (3 of 10)]
[im 1/1]
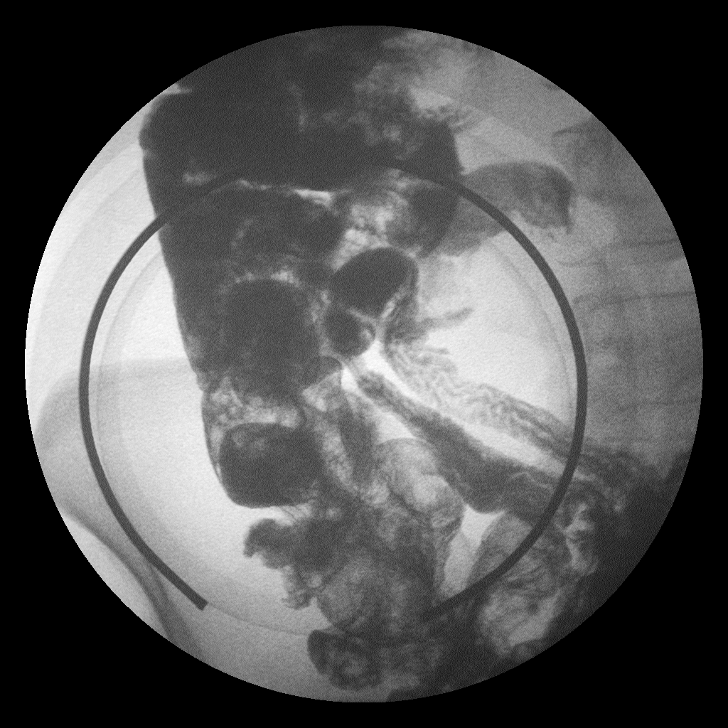

[Series 4: run · 1 of 1 slices shown (4 of 10)]
[im 1/1]
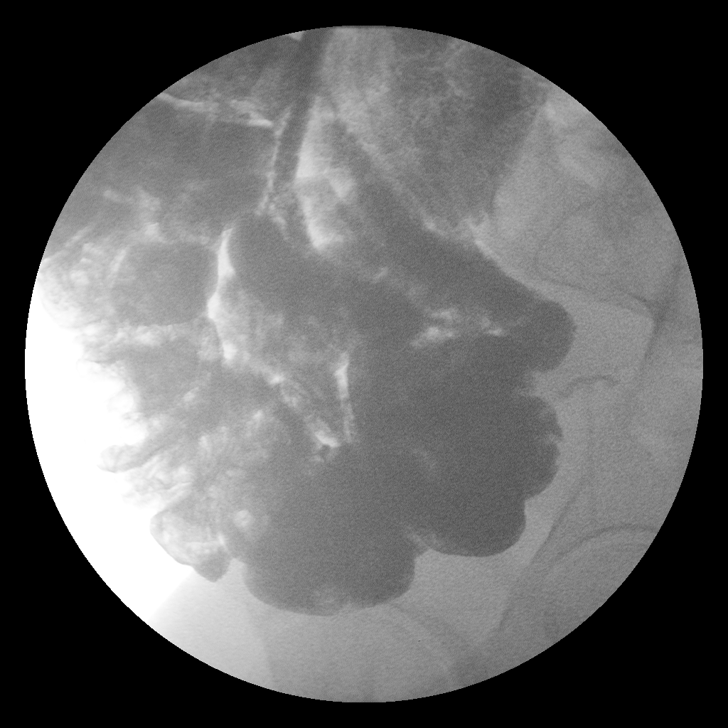

[Series 5: run · 1 of 1 slices shown (5 of 10)]
[im 1/1]
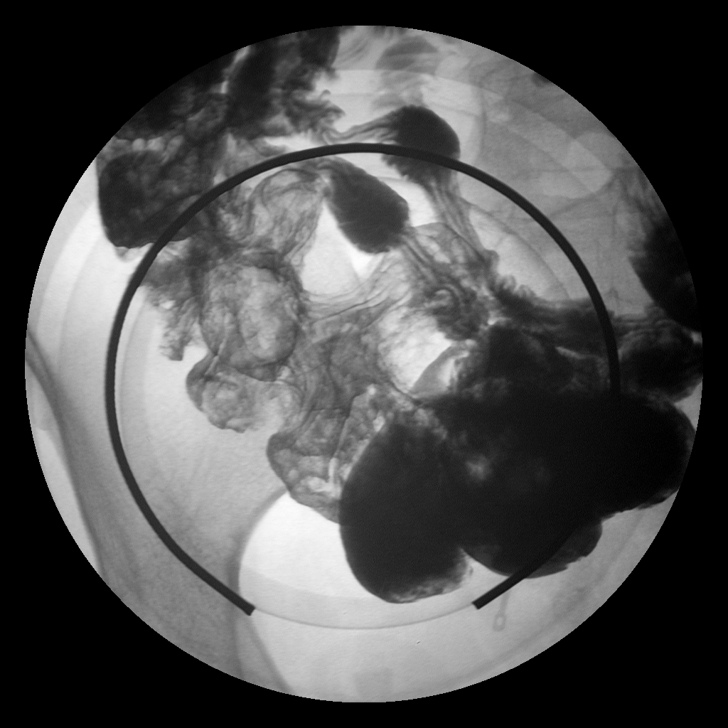

[Series 6: run · 1 of 1 slices shown (6 of 10)]
[im 1/1]
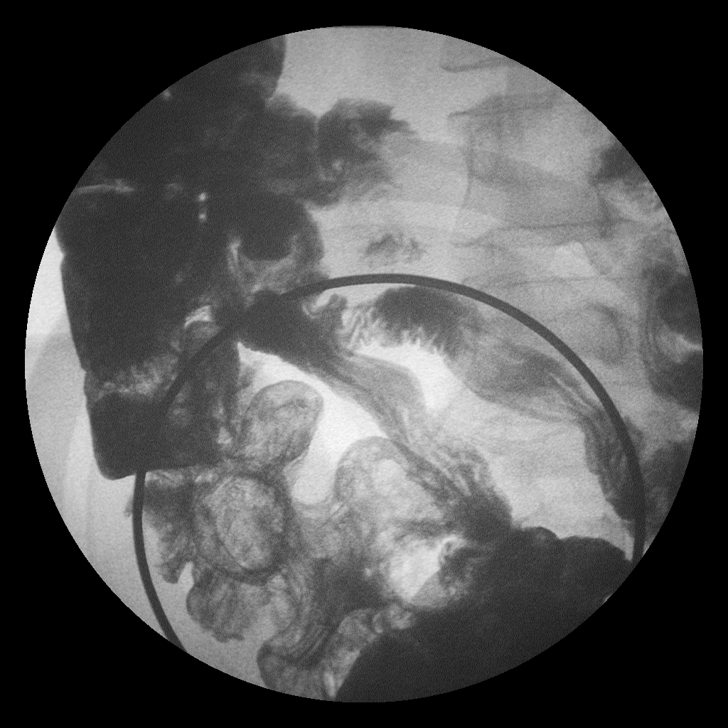

[Series 7: run · 1 of 1 slices shown (7 of 10)]
[im 1/1]
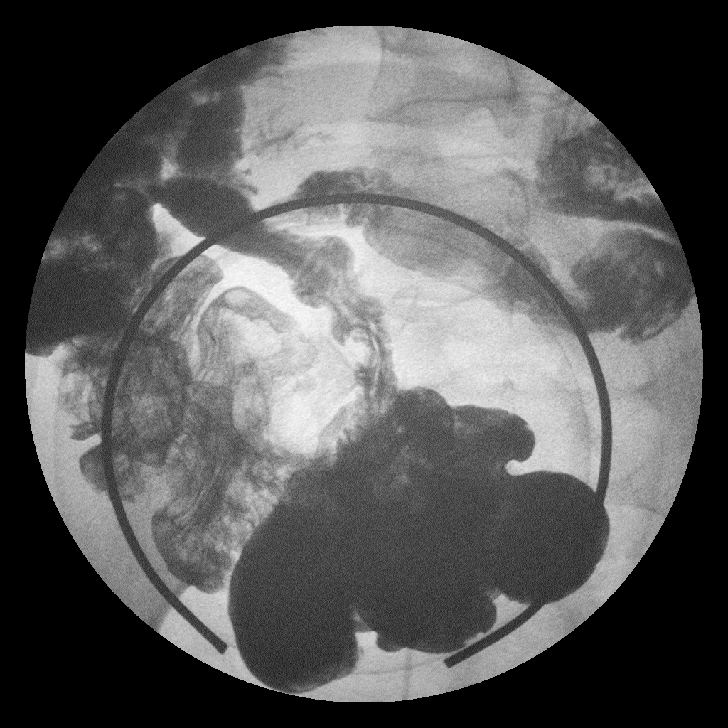

[Series 8: run · 1 of 1 slices shown (8 of 10)]
[im 1/1]
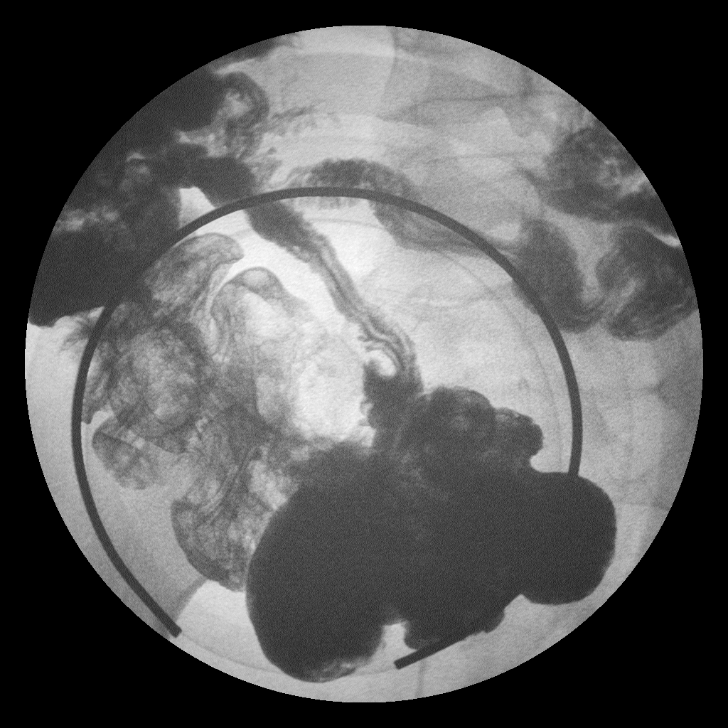

[Series 9: run · 1 of 1 slices shown (9 of 10)]
[im 1/1]
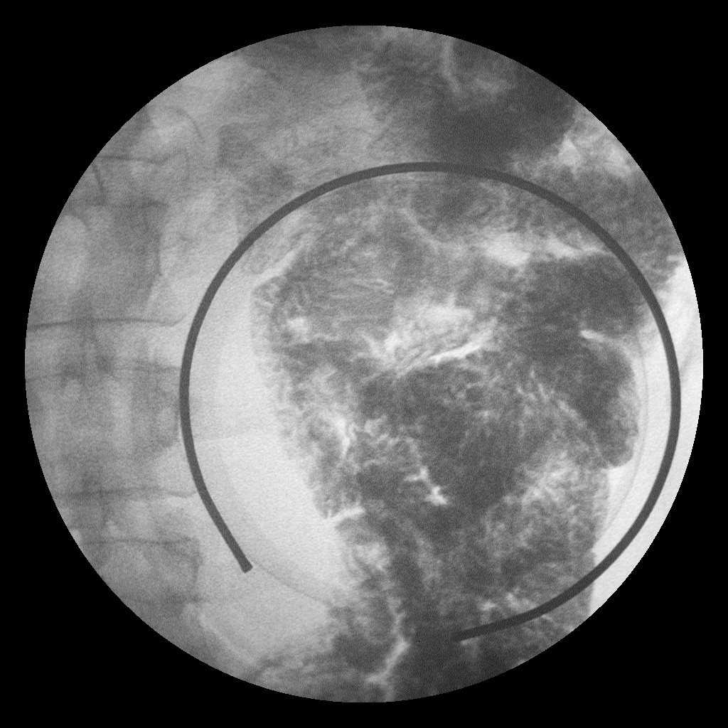

[Series 10: run · 1 of 1 slices shown (10 of 10)]
[im 1/1]
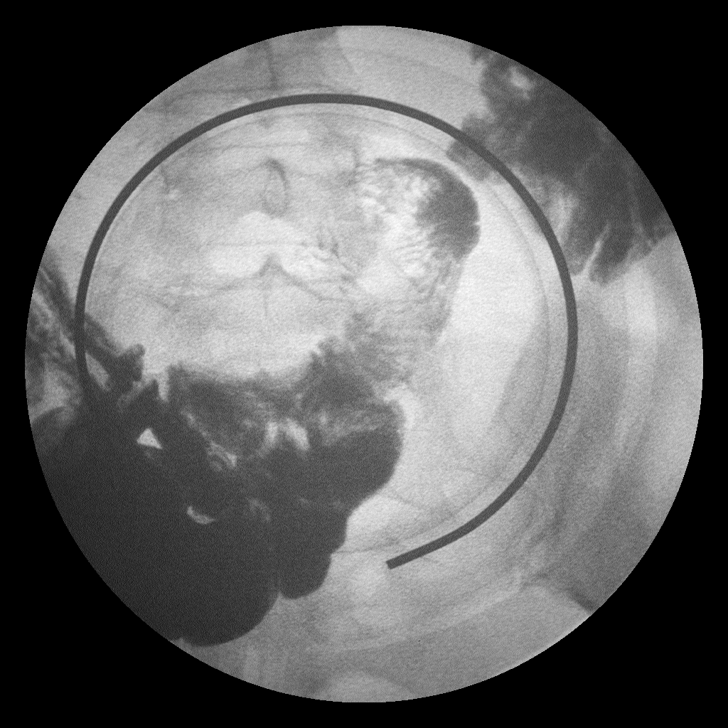

[10 of 10 positions shown; findings below may reference images not displayed]

FINDINGS: No evidence of bowel obstruction. Gas and stool seen
throughout the colon.  There is no intra-abdominal free air.  An
IUD projects in the pelvis.  There are no unexpected
calcifications.  The bones are normal with only slight apex right
scoliosis centered at L1-L2.

Contrast reaches the right colon in approximately 30 minutes.
Mucosal patterns for the ileum and jejunum are within normal
limits.  The terminal ileum is imaged, unremarkable in appearance.
IMPRESSION: No acute findings.

## 2011-04-08 IMAGING — CR DG SMALL BOWEL
2 series · 2 of 2 positions shown · non-contrast
Comparison: None.

CLINICAL DATA: 30-year-old female with abdominal pain for 3
months.

SMALL BOWEL SERIES
TECHNIQUE: Following ingestion of a mixture of thin barium and
Entero Vu, serial small bowel images were obtained including spot
views of the terminal ileum.
Fluoroscopy time:  2.7 minutes.

[t abdomen supine]
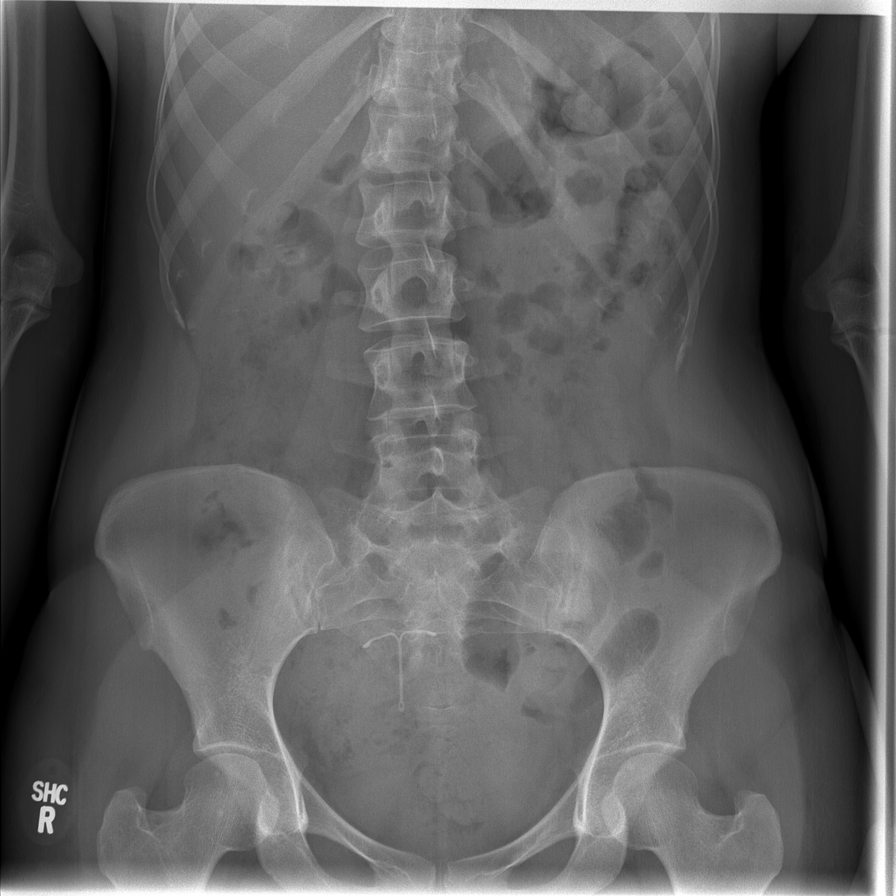

[t abdomen supine *]
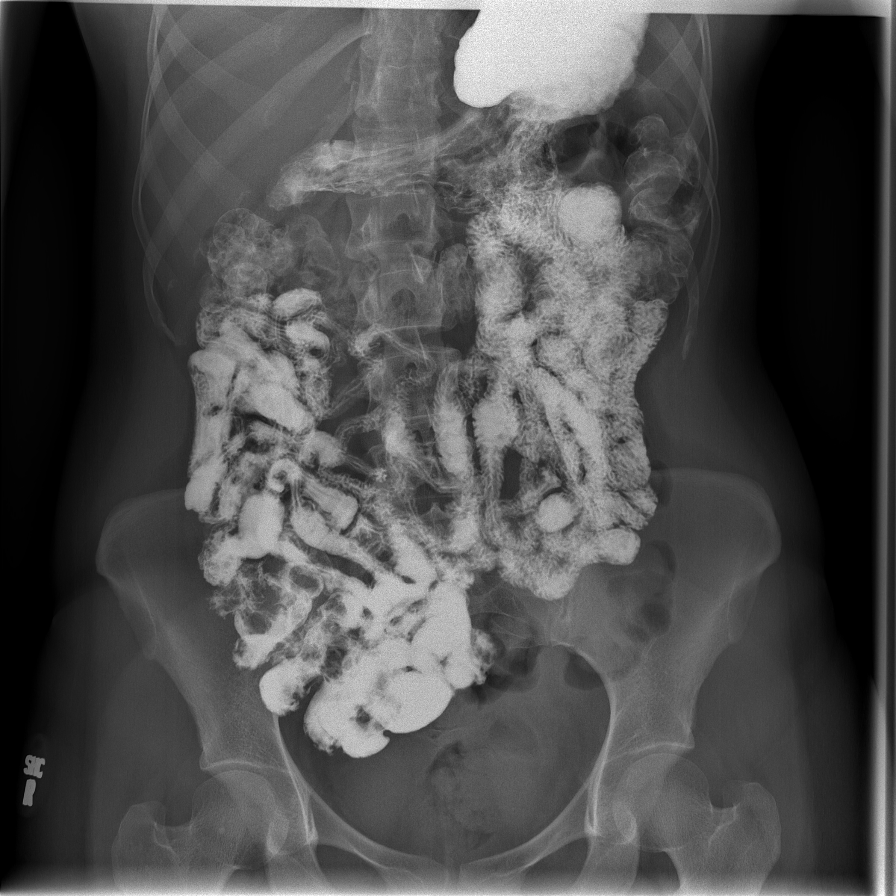

[2 of 2 positions shown; findings below may reference images not displayed]

FINDINGS: No evidence of bowel obstruction. Gas and stool seen
throughout the colon.  There is no intra-abdominal free air.  An
IUD projects in the pelvis.  There are no unexpected
calcifications.  The bones are normal with only slight apex right
scoliosis centered at L1-L2.

Contrast reaches the right colon in approximately 30 minutes.
Mucosal patterns for the ileum and jejunum are within normal
limits.  The terminal ileum is imaged, unremarkable in appearance.
IMPRESSION: No acute findings.

## 2011-05-13 ENCOUNTER — Ambulatory Visit: Payer: BC Managed Care – PPO | Admitting: Gastroenterology

## 2011-06-13 ENCOUNTER — Ambulatory Visit: Payer: BC Managed Care – PPO | Admitting: Gastroenterology

## 2011-07-18 ENCOUNTER — Encounter: Payer: Self-pay | Admitting: Gastroenterology

## 2011-07-18 ENCOUNTER — Other Ambulatory Visit (INDEPENDENT_AMBULATORY_CARE_PROVIDER_SITE_OTHER): Payer: BC Managed Care – PPO

## 2011-07-18 ENCOUNTER — Ambulatory Visit (INDEPENDENT_AMBULATORY_CARE_PROVIDER_SITE_OTHER): Payer: BC Managed Care – PPO | Admitting: Gastroenterology

## 2011-07-18 DIAGNOSIS — R109 Unspecified abdominal pain: Secondary | ICD-10-CM

## 2011-07-18 NOTE — Progress Notes (Signed)
Review of pertinent gastrointestinal problems: 1. intermittent abdominal pains, recurrent. Abdominal ultrasound 2012 showed normal gallbladder. HIDA scan 2012 was normal. EGD July 2012 showed mild gastritis, biopsies showed no H. pylori. Small bowel follow-through 2012 was also normal.   HPI: This is a very pleasant 30 yo woman with intermittent abd pains.   Her pains improved for a while.    No pattern on her food diarrhea.   The last time she hurt was August 10 and then again 2 weeks ago.  These pains would last for 1-2 hours.  Very good historian.  No associated nausea. No fevers or chills.  No NSAIDs.    She is never constipated.  Having a BM doesn' t make a difference.      Past Medical History  Diagnosis Date  . Chronic headaches   . Thyroid disease   . IBS (irritable bowel syndrome)     Past Surgical History  Procedure Date  . Diagnostic laparoscopy   . Wisom teeth     Current Outpatient Prescriptions  Medication Sig Dispense Refill  . buPROPion (WELLBUTRIN SR) 150 MG 12 hr tablet Take 150 mg by mouth daily.        . Calcium Citrate-Vitamin D (CITRACAL PETITES/VITAMIN D PO) Take 2 tablets by mouth 2 (two) times daily.        . cetirizine (ZYRTEC) 10 MG tablet Take 10 mg by mouth daily.        Marland Kitchen levonorgestrel (MIRENA) 20 MCG/24HR IUD 1 each by Intrauterine route once.        Marland Kitchen levothyroxine (SYNTHROID, LEVOTHROID) 112 MCG tablet Take 112 mcg by mouth daily.        . Multiple Vitamin (MULTIVITAMIN) tablet Take 1 tablet by mouth daily.        . pantoprazole (PROTONIX) 40 MG tablet Take 1 tablet (40 mg total) by mouth daily.  30 tablet  11  . Probiotic Product (PROBIOTIC PO) Take by mouth. Ultra Flora Plus DF.  Takes 1 capsule daily       . valACYclovir (VALTREX) 1000 MG tablet Take 1,000 mg by mouth as needed.         Current Facility-Administered Medications  Medication Dose Route Frequency Provider Last Rate Last Dose  . 0.9 %  sodium chloride infusion  500 mL  Intravenous Continuous Rob Bunting, MD        Allergies as of 07/18/2011 - Review Complete 07/18/2011  Allergen Reaction Noted  . Erythromycin Nausea And Vomiting   . Penicillins Rash     Family History  Problem Relation Age of Onset  . Clotting disorder Mother   . Irritable bowel syndrome Mother   . Diabetes Sister   . Irritable bowel syndrome Sister   . Prostate cancer Maternal Uncle   . Heart disease Maternal Uncle   . Diabetes Maternal Grandmother   . Heart disease Maternal Grandmother   . Stomach cancer Maternal Grandfather   . Colon cancer Maternal Grandfather   . Heart disease Maternal Grandfather   . Ovarian cancer Paternal Grandmother   . Melanoma Father     History   Social History  . Marital Status: Married    Spouse Name: N/A    Number of Children: 0  . Years of Education: N/A   Occupational History  . Pharmacy Tech    Social History Main Topics  . Smoking status: Never Smoker   . Smokeless tobacco: Never Used  . Alcohol Use: Yes     occ  .  Drug Use: No  . Sexually Active: Not on file   Other Topics Concern  . Not on file   Social History Narrative  . No narrative on file      Physical Exam: BP 116/80  Pulse 92  Ht 5' (1.524 m)  Wt 108 lb (48.988 kg)  BMI 21.09 kg/m2 Constitutional: generally well-appearing Psychiatric: alert and oriented x3 Abdomen: soft, nontender, nondistended, no obvious ascites, no peritoneal signs, normal bowel sounds     Assessment and plan: 30 y.o. female with intermittent gastric pain.   She has had a negative workup including abdominal ultrasound, HIDA scan, EGD, small bowel follow-through. Perhaps her symptoms are related to celiac sprue however I doubt that. She has not had cross-sectional imaging and we will set her up with CT scan and make sure not missing anything as well as celiac panel. She was offered gallbladder surgery many months ago when this first started since her story is decent for biliary  colic however there is no empiric evidence otherwise to support the gallbladder symptoms. If the CT scan and lab tests are negative she is probably going to go back to see her surgeon and reconsider cholecystectomy

## 2011-07-18 NOTE — Patient Instructions (Addendum)
You will be set up for a CT scan of abdomen and pelvis with IV and oral contrast for intermittent abd pains.   You have been scheduled for a CT scan of the abdomen and pelvis at [facility] ([address]---this is in the same building as Architectural technologist).   You are scheduled on 07/19/11 at 11 am . You should arrive 15 minutes prior to your appointment time for registration. Please follow the written instructions below on the day of your exam:  WARNING: IF YOU ARE ALLERGIC TO IODINE/X-RAY DYE, PLEASE NOTIFY RADIOLOGY IMMEDIATELY AT [phone]! YOU WILL BE GIVEN A 13 HOUR PREMEDICATION PREP.  1) Do not eat or drink anything after 7 am  (4 hours prior to your test) 2) You have been given 2 bottles of oral contrast to drink. The solution may taste better if refrigerated, but do NOT add ice or any other liquid to this solution. Shake  well before drinking.    Drink 1 bottle of contrast @ 9 am (2 hours prior to your exam)  Drink 1 bottle of contrast @ 10 am  (1 hour prior to your exam)  You may take any medications as prescribed with a small amount of water except for the following: Metformin, Glucophage, Glucovance, Avandamet, Riomet, Fortamet, Actoplus Met, Janumet, Glumetza or Metaglip. The above medications must be held the day of the exam AND 48 hours after the exam.  The purpose of you drinking the oral contrast is to aid in the visualization of your intestinal tract. The contrast solution may cause some diarrhea. Before your exam is started, you will be given a small amount of fluid to drink. Depending on your individual set of symptoms, you may also receive an intravenous injection of x-ray contrast/dye. Plan on being at [facility] for 30 minutes or long, depending on the type of exam you are having performed.  If you have any questions regarding your exam or if you need to reschedule, you may call the CT department at [phone] between the hours of 8:00 am and  5:00 pm, Monday-Friday.  ________________________________________________________________________   Cassandra Jefferson will have labs checked today in the basement lab.  Please head down after you check out with the front desk  (celiac sprue panel, total IgA level).

## 2011-07-19 ENCOUNTER — Telehealth (INDEPENDENT_AMBULATORY_CARE_PROVIDER_SITE_OTHER): Payer: Self-pay

## 2011-07-19 ENCOUNTER — Ambulatory Visit (INDEPENDENT_AMBULATORY_CARE_PROVIDER_SITE_OTHER)
Admission: RE | Admit: 2011-07-19 | Discharge: 2011-07-19 | Disposition: A | Discharge status: Home / Self Care | Payer: BC Managed Care – PPO | Source: Ambulatory Visit | Attending: Gastroenterology | Admitting: Gastroenterology

## 2011-07-19 DIAGNOSIS — R109 Unspecified abdominal pain: Secondary | ICD-10-CM

## 2011-07-19 LAB — CELIAC PANEL 10
Endomysial Screen: NEGATIVE
Gliadin IgA: 9 U/mL (ref <20)
Gliadin IgG: 11 U/mL (ref <20)
IgA: 323 mg/dL (ref 69–380)
Tissue Transglut Ab: 5.8 U/mL (ref <20)
Tissue Transglutaminase Ab, IgA: 5.3 U/mL (ref <20)

## 2011-07-19 LAB — IGA: IgA: 338 mg/dL (ref 68–378)

## 2011-07-19 IMAGING — CT CT ABD-PELV W/ CM
2 of 4 series · 17 of 46 positions shown, 19 images · IV contrast (omnipaque)
Comparison: None.

CLINICAL DATA: Abdominal pain

CT ABDOMEN AND PELVIS WITH CONTRAST
TECHNIQUE: Multidetector CT imaging of the abdomen and pelvis was
performed following the standard protocol during bolus
administration of intravenous contrast.
Contrast: 80mL OMNIPAQUE IOHEXOL 300 MG/ML IV SOLN

[Series 2: abd/ pel 5mm · axial · 0.59mm/px · z∈[-382,+3]mm · 14 of 85 slices shown, 16 images]
[im 4/85  soft-tissue]
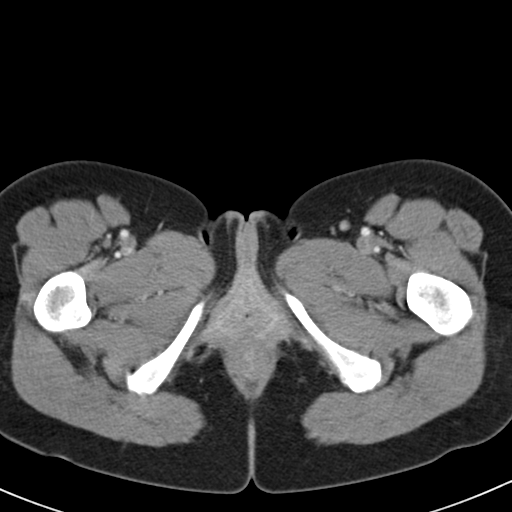
[im 4/85  bone]
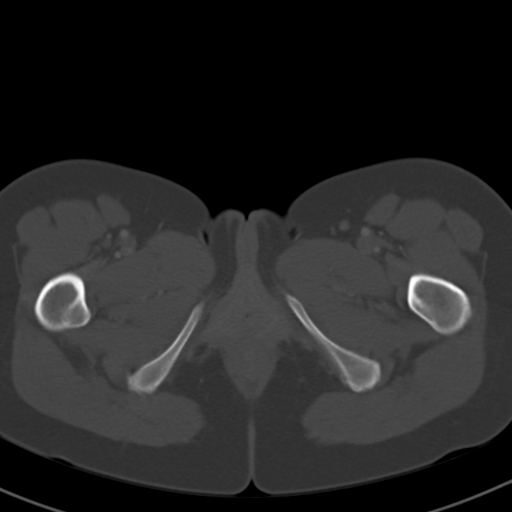
[im 11/85  soft-tissue]
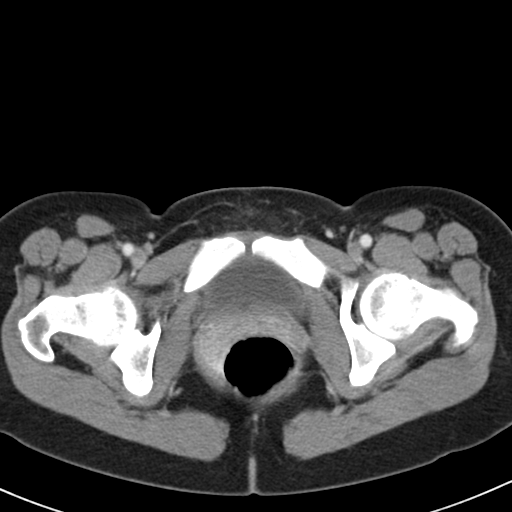
[im 17/85  soft-tissue]
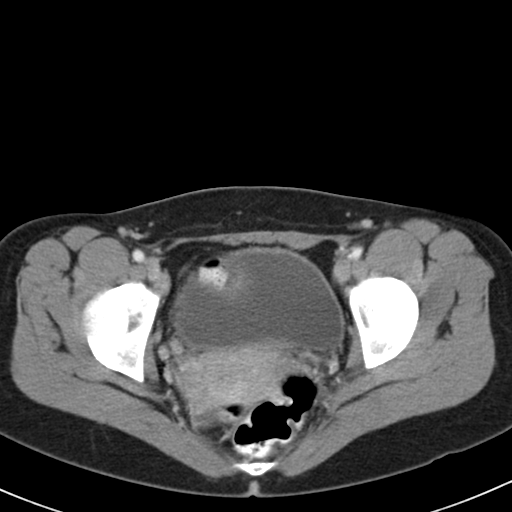
[im 24/85  soft-tissue]
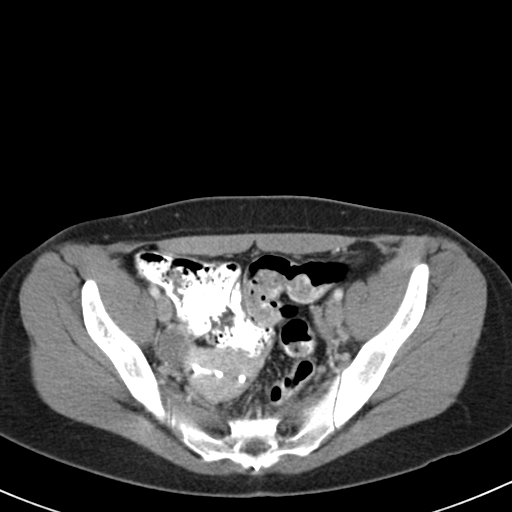
[im 27/85  soft-tissue]
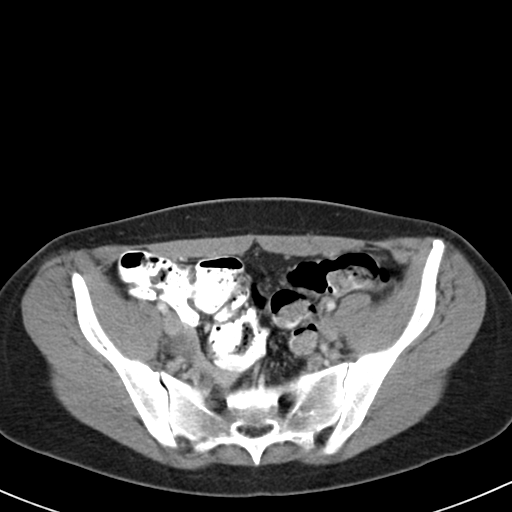
[im 34/85  soft-tissue]
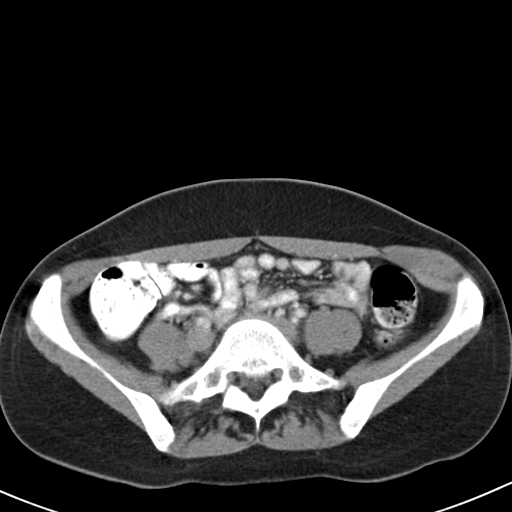
[im 41/85  soft-tissue]
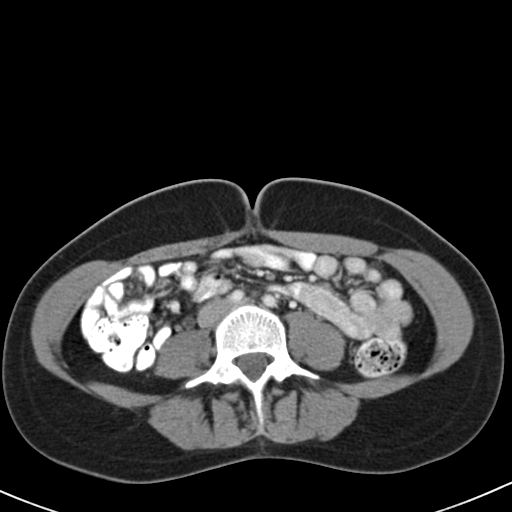
[im 44/85  soft-tissue]
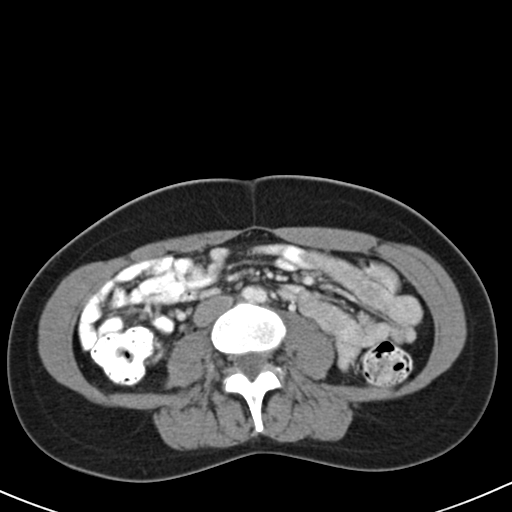
[im 51/85  soft-tissue]
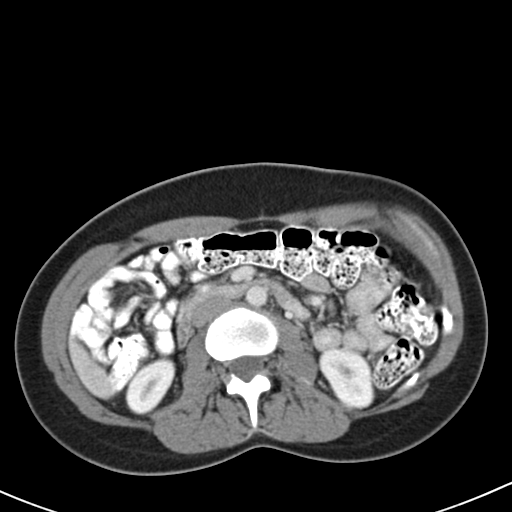
[im 51/85  bone]
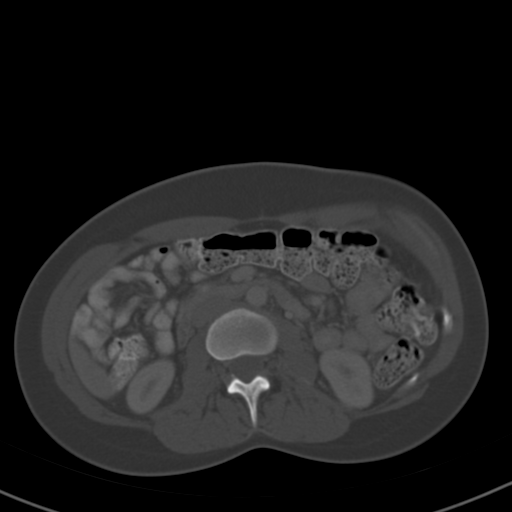
[im 58/85  soft-tissue]
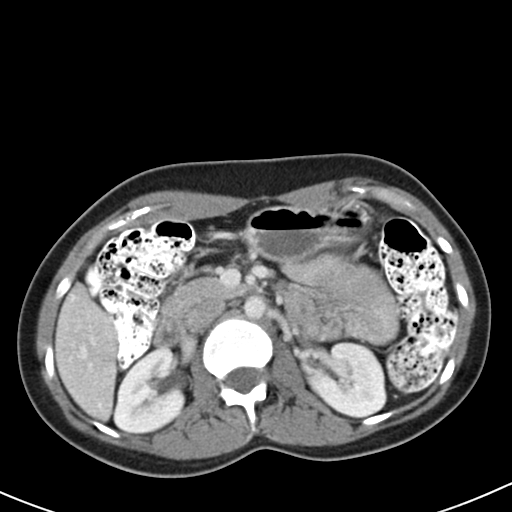
[im 64/85  soft-tissue]
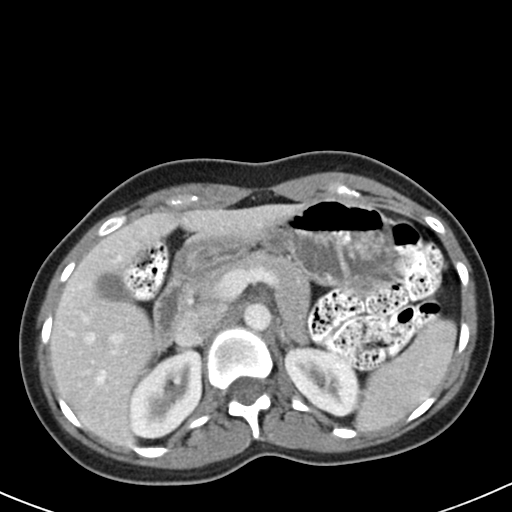
[im 68/85  soft-tissue]
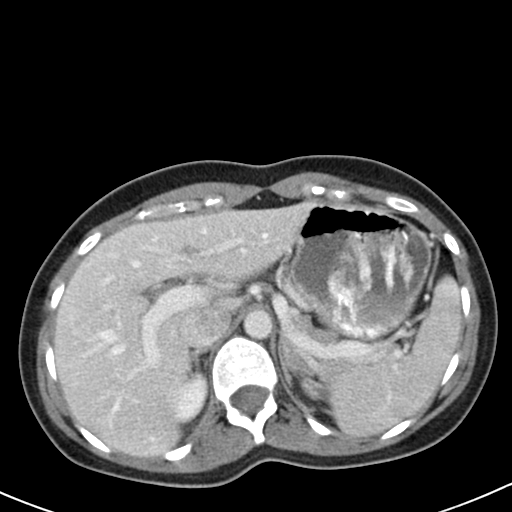
[im 74/85  soft-tissue]
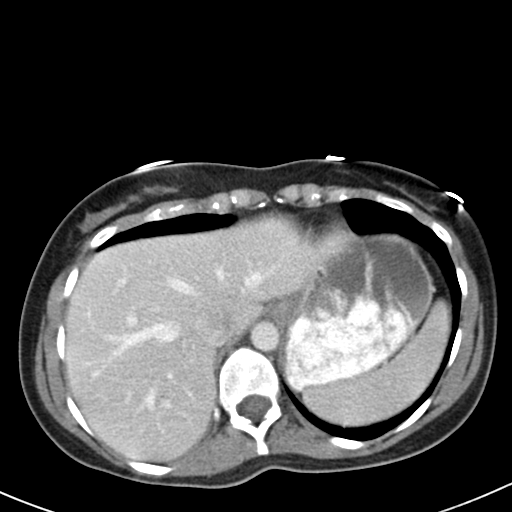
[im 81/85  soft-tissue]
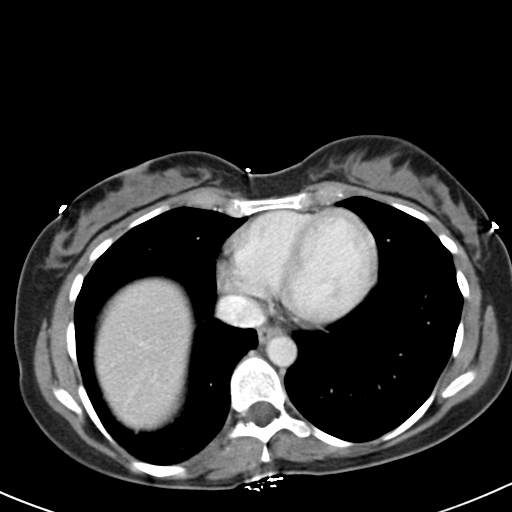

[Series 602: cor · coronal · 0.86mm/px · 3 of 81 slices shown]
[im 27/81  soft-tissue]
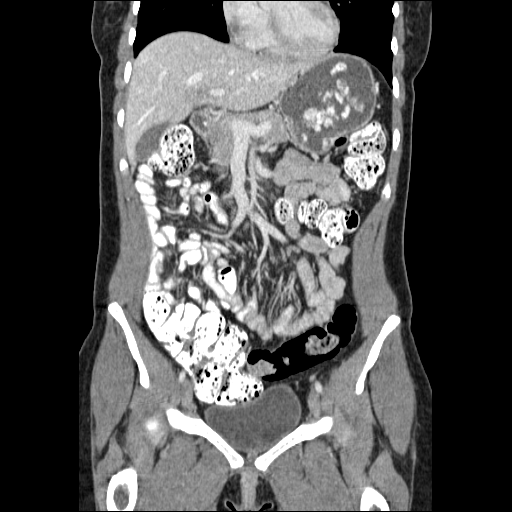
[im 36/81  soft-tissue]
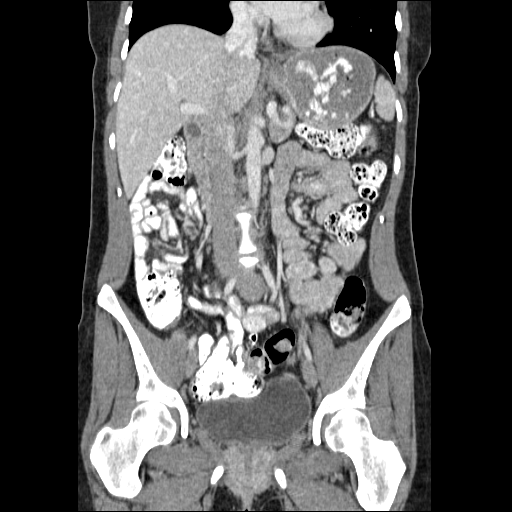
[im 45/81  soft-tissue]
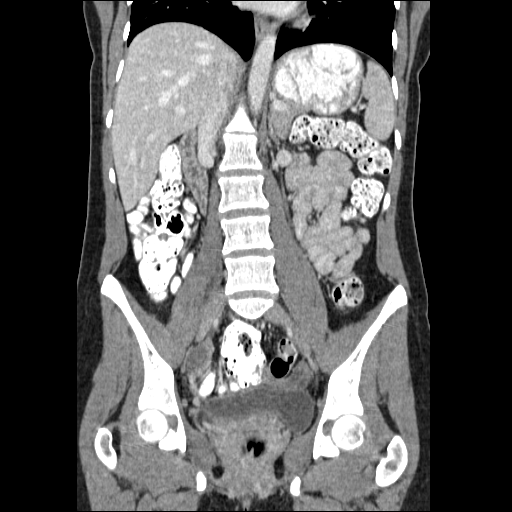

[17 of 46 positions shown; findings below may reference images not displayed]

FINDINGS: The lung bases appear clear.

No pericardial or pleural effusion.

No focal liver abnormalities identified.

The spleen appears within normal limits.

Both adrenal glands are normal.

The gallbladder is normal.  The biliary tree is unremarkable.  The
pancreas is negative.

Normal appearance of the right kidney.  The left kidney is also
normal.

There is no upper abdominal adenopathy.

No pelvic or inguinal adenopathy identified.  The urinary bladder
is normal.

IUD is noted within the uterine cavity.

There is a small simple appearing cyst within the right ovary
measuring 1.6 cm, image number 61.

The stomach and the small bowel loops are unremarkable.

Normal appearance of the colon.

The appendix is not visualized.  No secondary signs of acute
appendicitis.

Review of the visualized osseous structures is negative.
IMPRESSION: 1.  Normal exam.

## 2011-07-19 MED ORDER — IOHEXOL 300 MG/ML  SOLN
80.0000 mL | Freq: Once | INTRAMUSCULAR | Status: AC | PRN
  Administered 2011-07-19: 80 mL via INTRAVENOUS

## 2011-07-19 NOTE — Telephone Encounter (Signed)
Pt called to try and get an appt to schedule surgery before December 1st, due to her deductible deadline.  I advised her to call back after her CT scan tomorrow and I would talk with Dr. Donell Beers.  I told her I could not promise she would be seen here and have surgery before December 1st.  She understood.

## 2011-07-20 ENCOUNTER — Telehealth (INDEPENDENT_AMBULATORY_CARE_PROVIDER_SITE_OTHER): Payer: Self-pay

## 2011-07-20 NOTE — Telephone Encounter (Signed)
Informed the pt that Dr. Donell Beers had no sooner appointments to schedule her gallbladder surgery.  She agreed to keep her 08/17/11 appt.

## 2011-08-15 ENCOUNTER — Encounter (INDEPENDENT_AMBULATORY_CARE_PROVIDER_SITE_OTHER): Payer: Self-pay | Admitting: General Surgery

## 2011-08-19 ENCOUNTER — Encounter (INDEPENDENT_AMBULATORY_CARE_PROVIDER_SITE_OTHER): Payer: Self-pay | Admitting: General Surgery

## 2011-10-20 ENCOUNTER — Other Ambulatory Visit: Payer: Self-pay | Admitting: Obstetrics and Gynecology

## 2013-02-20 ENCOUNTER — Other Ambulatory Visit: Payer: Self-pay | Admitting: Obstetrics and Gynecology

## 2014-03-05 ENCOUNTER — Other Ambulatory Visit: Payer: Self-pay | Admitting: Obstetrics and Gynecology

## 2014-03-06 LAB — CYTOLOGY - PAP

## 2015-03-18 ENCOUNTER — Other Ambulatory Visit: Payer: Self-pay | Admitting: Obstetrics and Gynecology

## 2015-03-19 LAB — CYTOLOGY - PAP

## 2015-11-25 ENCOUNTER — Encounter: Payer: Self-pay | Admitting: Oncology

## 2015-12-02 ENCOUNTER — Ambulatory Visit (HOSPITAL_BASED_OUTPATIENT_CLINIC_OR_DEPARTMENT_OTHER): Payer: BLUE CROSS/BLUE SHIELD | Admitting: Oncology

## 2015-12-02 VITALS — BP 113/76 | HR 85 | Temp 98.3°F | Resp 18 | Ht 60.0 in | Wt 105.3 lb

## 2015-12-02 DIAGNOSIS — Z8249 Family history of ischemic heart disease and other diseases of the circulatory system: Secondary | ICD-10-CM

## 2015-12-02 DIAGNOSIS — E039 Hypothyroidism, unspecified: Secondary | ICD-10-CM

## 2015-12-02 DIAGNOSIS — D696 Thrombocytopenia, unspecified: Secondary | ICD-10-CM | POA: Diagnosis not present

## 2015-12-02 DIAGNOSIS — R5383 Other fatigue: Secondary | ICD-10-CM

## 2015-12-02 DIAGNOSIS — Z832 Family history of diseases of the blood and blood-forming organs and certain disorders involving the immune mechanism: Secondary | ICD-10-CM | POA: Diagnosis not present

## 2015-12-02 DIAGNOSIS — Z806 Family history of leukemia: Secondary | ICD-10-CM

## 2015-12-02 NOTE — Progress Notes (Signed)
Please see consult note.  

## 2015-12-02 NOTE — Consult Note (Signed)
Reason for Referral: Family history of coagulation abnormalities.   HPI: 35 year old woman currently of [place] where she is residing. She has a history of hypothyroidism and irritable bowel syndrome but no other significant comorbid conditions. She also had a history of chronic fatigue for many years with not clear diagnosis. She reports that her fatigue improves with thyroid medication adjustment periodically. She'll also have had chronic abdominal discomfort related to oral contraceptives which have also improved recently. She does have a family history of malignant disorder including her father who has CLL and died as a complications from that. Her mother ha has MTHFR mutation but it is unclear whether she had any clear-cut thrombotic events. She is currently not anticoagulated per her report. She denied any personal history of thrombosis or bleeding episodes. She has been on oral contraceptives for many years and have been discontinued lately. She have never been pregnant and never really had any bleeding episodes. She denied any bleeding after teeth extraction. She had also laparoscopic surgery that was also uncomplicated. She is a very functional individual works full time and exercises regularly. She has had heavy menses in the past but appears to be more regular at this time. She denied any menorrhagia or dysmenorrhea.  She denied any headaches, blurry vision, syncope or seizures. She does not report any fevers, chills, sweats or weight loss. She does not report any chest pain, palpitation, orthopnea or leg edema. She does not report any cough, wheezing or hemoptysis. Does not report any nausea, vomiting, abdominal pain, diarrhea or constipation. She does not report any frequency, urgency or hesitancy. Remaining review of systems unremarkable.   Past Medical History  Diagnosis Date  . Chronic headaches   . Thyroid disease   . IBS (irritable bowel syndrome)   :  Past Surgical History   Procedure Laterality Date  . Diagnostic laparoscopy    . Wisom teeth    :   Current outpatient prescriptions:  .  albuterol (PROVENTIL) (2.5 MG/3ML) 0.083% nebulizer solution, Take 2.5 mg by nebulization every 6 (six) hours as needed for wheezing or shortness of breath. For asthma, Disp: , Rfl:  .  Calcium Citrate-Vitamin D (CITRACAL PETITES/VITAMIN D PO), Take 2 tablets by mouth 2 (two) times daily.  , Disp: , Rfl:  .  cyproheptadine (PERIACTIN) 4 MG tablet, Take 4 mg by mouth as needed. For allergies, Disp: , Rfl: 4 .  Multiple Vitamin (MULTIVITAMIN) tablet, Take 1 tablet by mouth daily.  , Disp: , Rfl:  .  Probiotic Product (PROBIOTIC PO), Take by mouth. Ultra Flora Plus DF.  Takes 1 capsule daily , Disp: , Rfl:  .  QNASL 80 MCG/ACT AERS, Place 80 mcg into the nose as needed. For allergies, Disp: , Rfl: 4 .  Thyroid (NATURE-THROID PO), Take 1 tablet by mouth daily. 1 and 3/4's grains daily, Disp: , Rfl:  .  valACYclovir (VALTREX) 1000 MG tablet, Take 1,000 mg by mouth as needed.  , Disp: , Rfl:  .  levonorgestrel (MIRENA) 20 MCG/24HR IUD, 1 each by Intrauterine route once.  , Disp: , Rfl:  .  pantoprazole (PROTONIX) 40 MG tablet, Take 1 tablet (40 mg total) by mouth daily., Disp: 30 tablet, Rfl: 11  Current facility-administered medications:  .  0.9 %  sodium chloride infusion, 500 mL, Intravenous, Continuous, Rachael Fee, MD:  Allergies  Allergen Reactions  . Erythromycin Nausea And Vomiting  . Penicillins Rash  :  Family History  Problem Relation Age of Onset  .  Clotting disorder Mother   . Irritable bowel syndrome Mother   . Diabetes Sister   . Irritable bowel syndrome Sister   . Prostate cancer Maternal Uncle   . Heart disease Maternal Uncle   . Diabetes Maternal Grandmother   . Heart disease Maternal Grandmother   . Stomach cancer Maternal Grandfather   . Colon cancer Maternal Grandfather   . Heart disease Maternal Grandfather   . Ovarian cancer Paternal  Grandmother   . Melanoma Father   :  Social History   Social History  . Marital Status: Married    Spouse Name: N/A  . Number of Children: 0  . Years of Education: N/A   Occupational History  . Pharmacy Tech    Social History Main Topics  . Smoking status: Never Smoker   . Smokeless tobacco: Never Used  . Alcohol Use: Yes     Comment: occ  . Drug Use: No  . Sexual Activity: Not on file   Other Topics Concern  . Not on file   Social History Narrative  . No narrative on file  :  Pertinent items are noted in HPI.  Exam: Blood pressure 113/76, pulse 85, temperature 98.3 F (36.8 C), temperature source Oral, resp. rate 18, height 5' (1.524 m), weight 105 lb 4.8 oz (47.764 kg), SpO2 100 %. General appearance: alert and cooperative Head: Normocephalic, without obvious abnormality Throat: lips, mucosa, and tongue normal; teeth and gums normal Neck: no adenopathy Back: negative Resp: clear to auscultation bilaterally Chest wall: no tenderness Cardio: regular rate and rhythm, S1, S2 normal, no murmur, click, rub or gallop GI: soft, non-tender; bowel sounds normal; no masses,  no organomegaly Extremities: extremities normal, atraumatic, no cyanosis or edema Pulses: 2+ and symmetric Skin: Skin color, texture, turgor normal. No rashes or lesions Lymph nodes: Cervical, supraclavicular, and axillary nodes normal.    CBC    Component Value Date/Time   WBC 6.6 07/01/2009 1316   RBC 4.25 07/01/2009 1316   HGB 14.0 07/01/2009 1316   HCT 40.1 07/01/2009 1316   PLT 149.0* 07/01/2009 1316   MCV 94.4 07/01/2009 1316   MCHC 34.9 07/01/2009 1316   RDW 11.6 07/01/2009 1316   LYMPHSABS 1.9 07/01/2009 1316   MONOABS 0.5 07/01/2009 1316   EOSABS 0.5 07/01/2009 1316   BASOSABS 0.1 07/01/2009 1316      Chemistry      Component Value Date/Time   NA 140 07/01/2009 1316   K 4.5 07/01/2009 1316   CL 103 07/01/2009 1316   CO2 28 07/01/2009 1316   BUN 9 07/01/2009 1316    CREATININE 0.6 07/01/2009 1316      Component Value Date/Time   CALCIUM 8.9 07/01/2009 1316   ALKPHOS 38* 07/01/2009 1316   AST 28 07/01/2009 1316   ALT 25 07/01/2009 1316   BILITOT 1.3* 07/01/2009 1316     *RADIOLOGY REPORT*  Clinical Data: Abdominal pain  CT ABDOMEN AND PELVIS WITH CONTRAST  Technique: Multidetector CT imaging of the abdomen and pelvis was performed following the standard protocol during bolus administration of intravenous contrast.  Contrast: 80mL OMNIPAQUE IOHEXOL 300 MG/ML IV SOLN  Comparison: None.  Findings: The lung bases appear clear.  No pericardial or pleural effusion.  No focal liver abnormalities identified.  The spleen appears within normal limits.  Both adrenal glands are normal.  The gallbladder is normal. The biliary tree is unremarkable. The pancreas is negative.  Normal appearance of the right kidney. The left kidney is also normal.  There is no upper abdominal adenopathy.  No pelvic or inguinal adenopathy identified. The urinary bladder is normal.  IUD is noted within the uterine cavity.  There is a small simple appearing cyst within the right ovary measuring 1.6 cm, image number 61.  The stomach and the small bowel loops are unremarkable.  Normal appearance of the colon.  The appendix is not visualized. No secondary signs of acute appendicitis.  Review of the visualized osseous structures is negative.  IMPRESSION:  1. Normal exam.  Assessment and Plan:    35 year old woman with the following issues:  1. Family history of thrombosis: Her family history on her mother's side has significant thrombotic events although it is unclear whether her mother had a DVT or not. Her mother have been tested for MTHFR mutation although the value of this finding is debatable. She does not have any personal history of deep vein thrombosis.  Given the first degree relatives of deep vein thrombosis she  is at a risk of developing personal history of thrombosis. Although she have not been pregnant previously, she does not desire pregnancy at this time. She had been on oral contraceptives in the past without any history of thrombosis. I continued to encourage her to avoid certain situations that we'll increase her thrombosis risk. These precautions including early mobilization after surgery, avoiding immobilization with planned rising, lites among others. I see no value in obtaining hypercoagulable panel at this time.  2. Family history of CLL: It is unclear whether there is a familial pattern to CLL but no genetic testing is available or required in her case.  3. Symptoms of fatigue and tiredness: I do not think this is unrelated to hematological disorder. She had multiple laboratory testing in the past as well as imaging studies that have been unrevealing. I do not recommend any repeat testing or imaging studies at this time. I think her fatigue is likely related to thyroid disease or other autoimmune etiologies.  4. Thrombocytopenia: Very mild and completely asymptomatic. No further follow-up is needed from that standpoint.  All her questions were answered to her satisfaction and I'm happy to see her in the future as needed.

## 2015-12-09 DIAGNOSIS — M545 Low back pain: Secondary | ICD-10-CM | POA: Diagnosis not present

## 2015-12-23 DIAGNOSIS — M545 Low back pain: Secondary | ICD-10-CM | POA: Diagnosis not present

## 2016-01-06 DIAGNOSIS — M545 Low back pain: Secondary | ICD-10-CM | POA: Diagnosis not present

## 2016-01-13 DIAGNOSIS — Z30433 Encounter for removal and reinsertion of intrauterine contraceptive device: Secondary | ICD-10-CM | POA: Diagnosis not present

## 2016-01-13 DIAGNOSIS — Z3202 Encounter for pregnancy test, result negative: Secondary | ICD-10-CM | POA: Diagnosis not present

## 2016-01-20 DIAGNOSIS — M545 Low back pain: Secondary | ICD-10-CM | POA: Diagnosis not present

## 2016-02-03 DIAGNOSIS — R198 Other specified symptoms and signs involving the digestive system and abdomen: Secondary | ICD-10-CM | POA: Diagnosis not present

## 2016-02-03 DIAGNOSIS — R5383 Other fatigue: Secondary | ICD-10-CM | POA: Diagnosis not present

## 2016-02-03 DIAGNOSIS — M545 Low back pain: Secondary | ICD-10-CM | POA: Diagnosis not present

## 2016-02-03 DIAGNOSIS — Z79899 Other long term (current) drug therapy: Secondary | ICD-10-CM | POA: Diagnosis not present

## 2016-02-03 DIAGNOSIS — D6942 Congenital and hereditary thrombocytopenia purpura: Secondary | ICD-10-CM | POA: Diagnosis not present

## 2016-02-04 DIAGNOSIS — M545 Low back pain: Secondary | ICD-10-CM | POA: Diagnosis not present

## 2016-02-10 DIAGNOSIS — M549 Dorsalgia, unspecified: Secondary | ICD-10-CM | POA: Diagnosis not present

## 2016-02-10 DIAGNOSIS — L709 Acne, unspecified: Secondary | ICD-10-CM | POA: Diagnosis not present

## 2016-02-10 DIAGNOSIS — G4700 Insomnia, unspecified: Secondary | ICD-10-CM | POA: Diagnosis not present

## 2016-02-10 DIAGNOSIS — M545 Low back pain: Secondary | ICD-10-CM | POA: Diagnosis not present

## 2016-02-10 DIAGNOSIS — Z681 Body mass index (BMI) 19 or less, adult: Secondary | ICD-10-CM | POA: Diagnosis not present

## 2016-02-24 DIAGNOSIS — M545 Low back pain: Secondary | ICD-10-CM | POA: Diagnosis not present

## 2016-03-17 DIAGNOSIS — M545 Low back pain: Secondary | ICD-10-CM | POA: Diagnosis not present

## 2016-03-23 DIAGNOSIS — M545 Low back pain: Secondary | ICD-10-CM | POA: Diagnosis not present

## 2016-03-30 DIAGNOSIS — Z681 Body mass index (BMI) 19 or less, adult: Secondary | ICD-10-CM | POA: Diagnosis not present

## 2016-03-30 DIAGNOSIS — Z01419 Encounter for gynecological examination (general) (routine) without abnormal findings: Secondary | ICD-10-CM | POA: Diagnosis not present

## 2016-05-18 DIAGNOSIS — H33301 Unspecified retinal break, right eye: Secondary | ICD-10-CM | POA: Diagnosis not present

## 2016-05-18 DIAGNOSIS — H52203 Unspecified astigmatism, bilateral: Secondary | ICD-10-CM | POA: Diagnosis not present

## 2016-07-13 DIAGNOSIS — Z0000 Encounter for general adult medical examination without abnormal findings: Secondary | ICD-10-CM | POA: Diagnosis not present

## 2016-07-13 DIAGNOSIS — Z136 Encounter for screening for cardiovascular disorders: Secondary | ICD-10-CM | POA: Diagnosis not present

## 2016-07-20 DIAGNOSIS — Z0000 Encounter for general adult medical examination without abnormal findings: Secondary | ICD-10-CM | POA: Diagnosis not present

## 2016-07-20 DIAGNOSIS — Z23 Encounter for immunization: Secondary | ICD-10-CM | POA: Diagnosis not present

## 2016-07-20 DIAGNOSIS — Z681 Body mass index (BMI) 19 or less, adult: Secondary | ICD-10-CM | POA: Diagnosis not present

## 2016-07-25 ENCOUNTER — Ambulatory Visit (INDEPENDENT_AMBULATORY_CARE_PROVIDER_SITE_OTHER): Payer: BLUE CROSS/BLUE SHIELD | Admitting: Urgent Care

## 2016-07-25 ENCOUNTER — Ambulatory Visit (INDEPENDENT_AMBULATORY_CARE_PROVIDER_SITE_OTHER): Payer: BLUE CROSS/BLUE SHIELD

## 2016-07-25 VITALS — BP 110/68 | HR 85 | Temp 98.5°F | Resp 18 | Ht 60.0 in | Wt 99.8 lb

## 2016-07-25 DIAGNOSIS — M7989 Other specified soft tissue disorders: Secondary | ICD-10-CM | POA: Diagnosis not present

## 2016-07-25 DIAGNOSIS — S93491A Sprain of other ligament of right ankle, initial encounter: Secondary | ICD-10-CM | POA: Diagnosis not present

## 2016-07-25 DIAGNOSIS — M25571 Pain in right ankle and joints of right foot: Secondary | ICD-10-CM | POA: Diagnosis not present

## 2016-07-25 IMAGING — DX DG ANKLE COMPLETE 3+V*R*
3 series · 3 of 3 positions shown · non-contrast
Comparison: None in PACs

CLINICAL DATA: Twisting injury of the ankle yesterday with
persistent ankle discomfort.

EXAM:
RIGHT ANKLE - COMPLETE 3+ VIEW

[ankle ap]
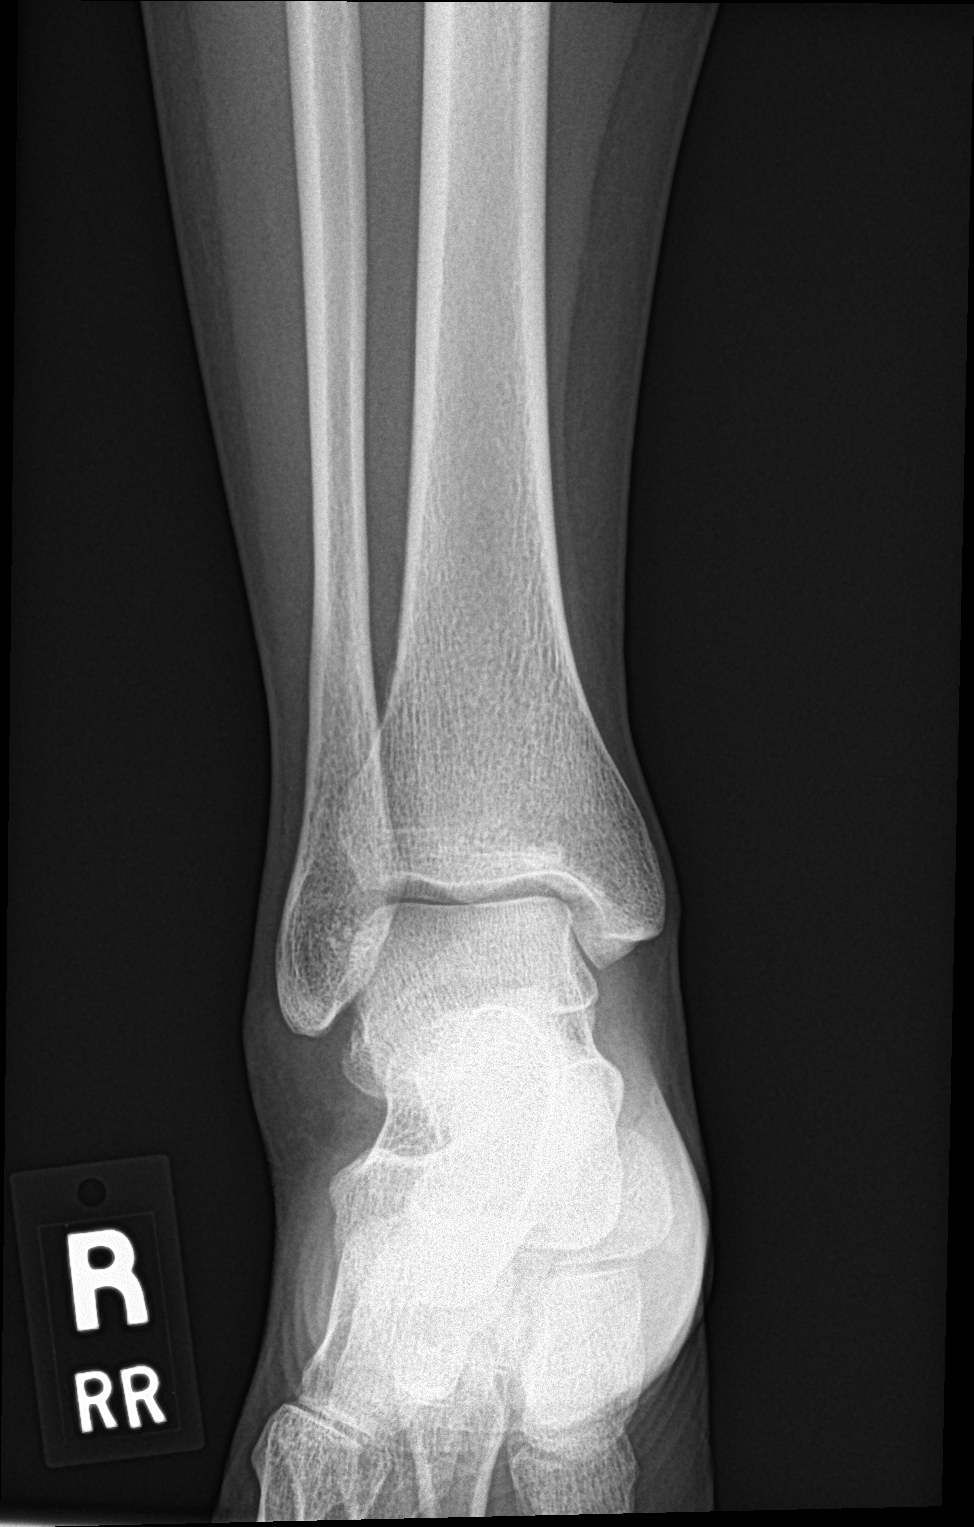

[ankle obl]
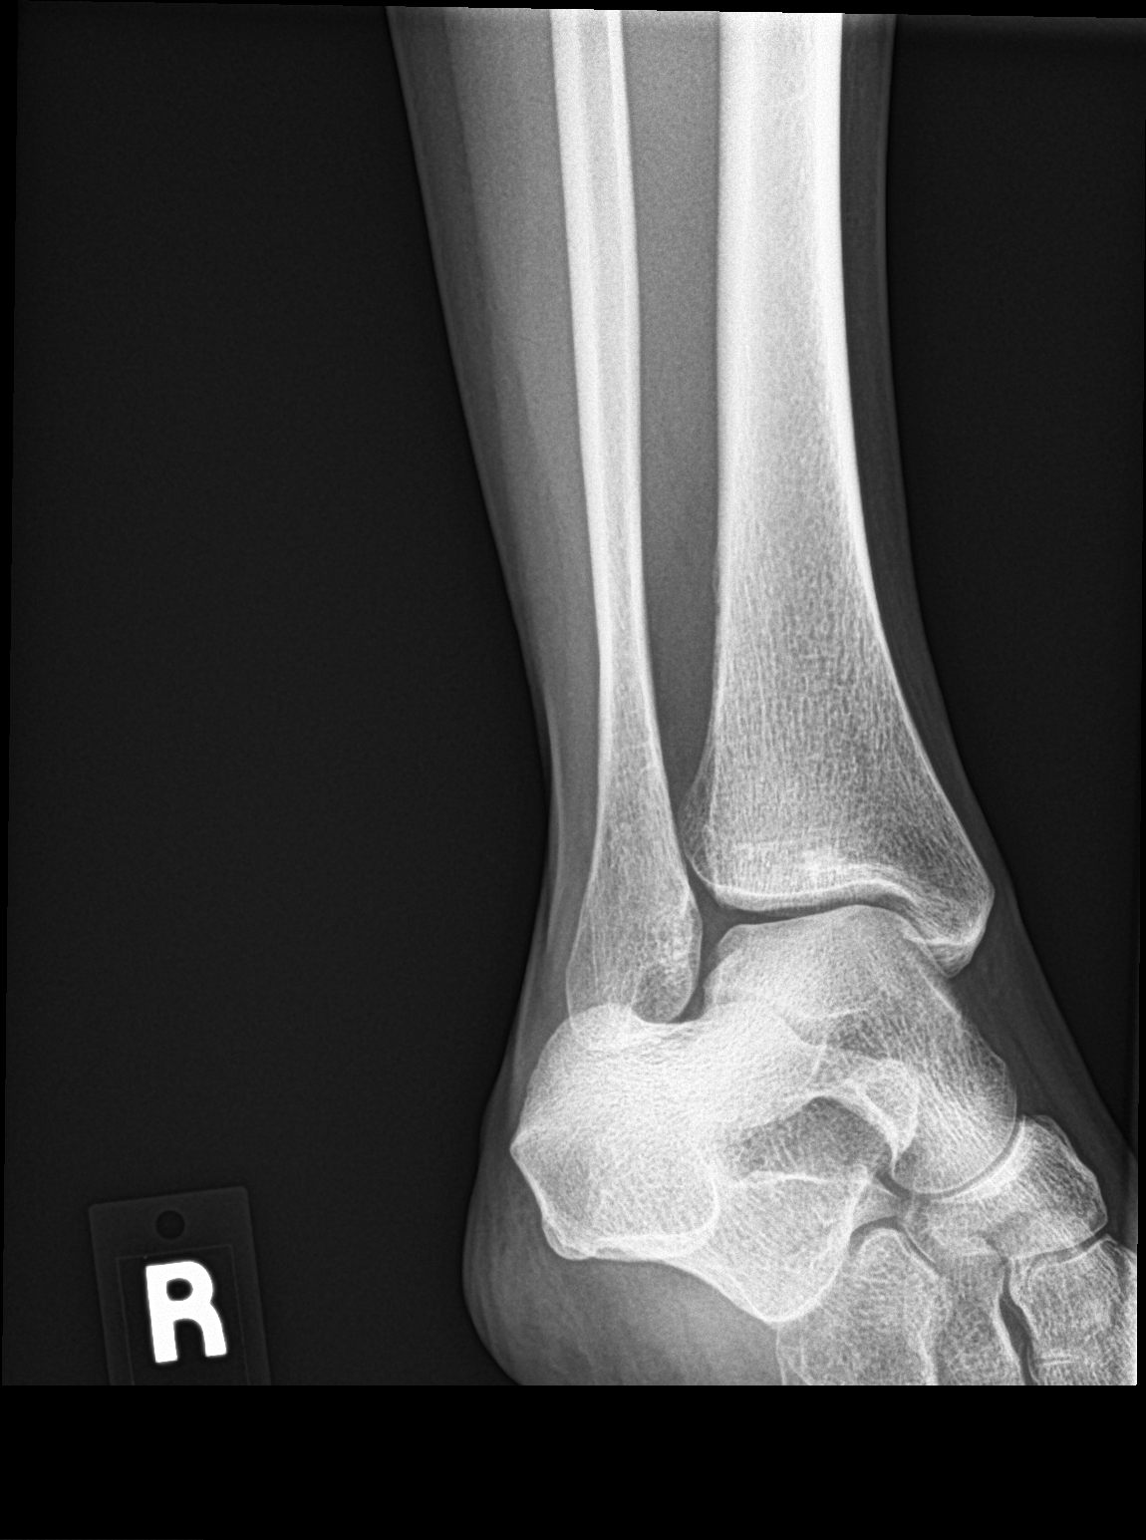

[ankle lat]
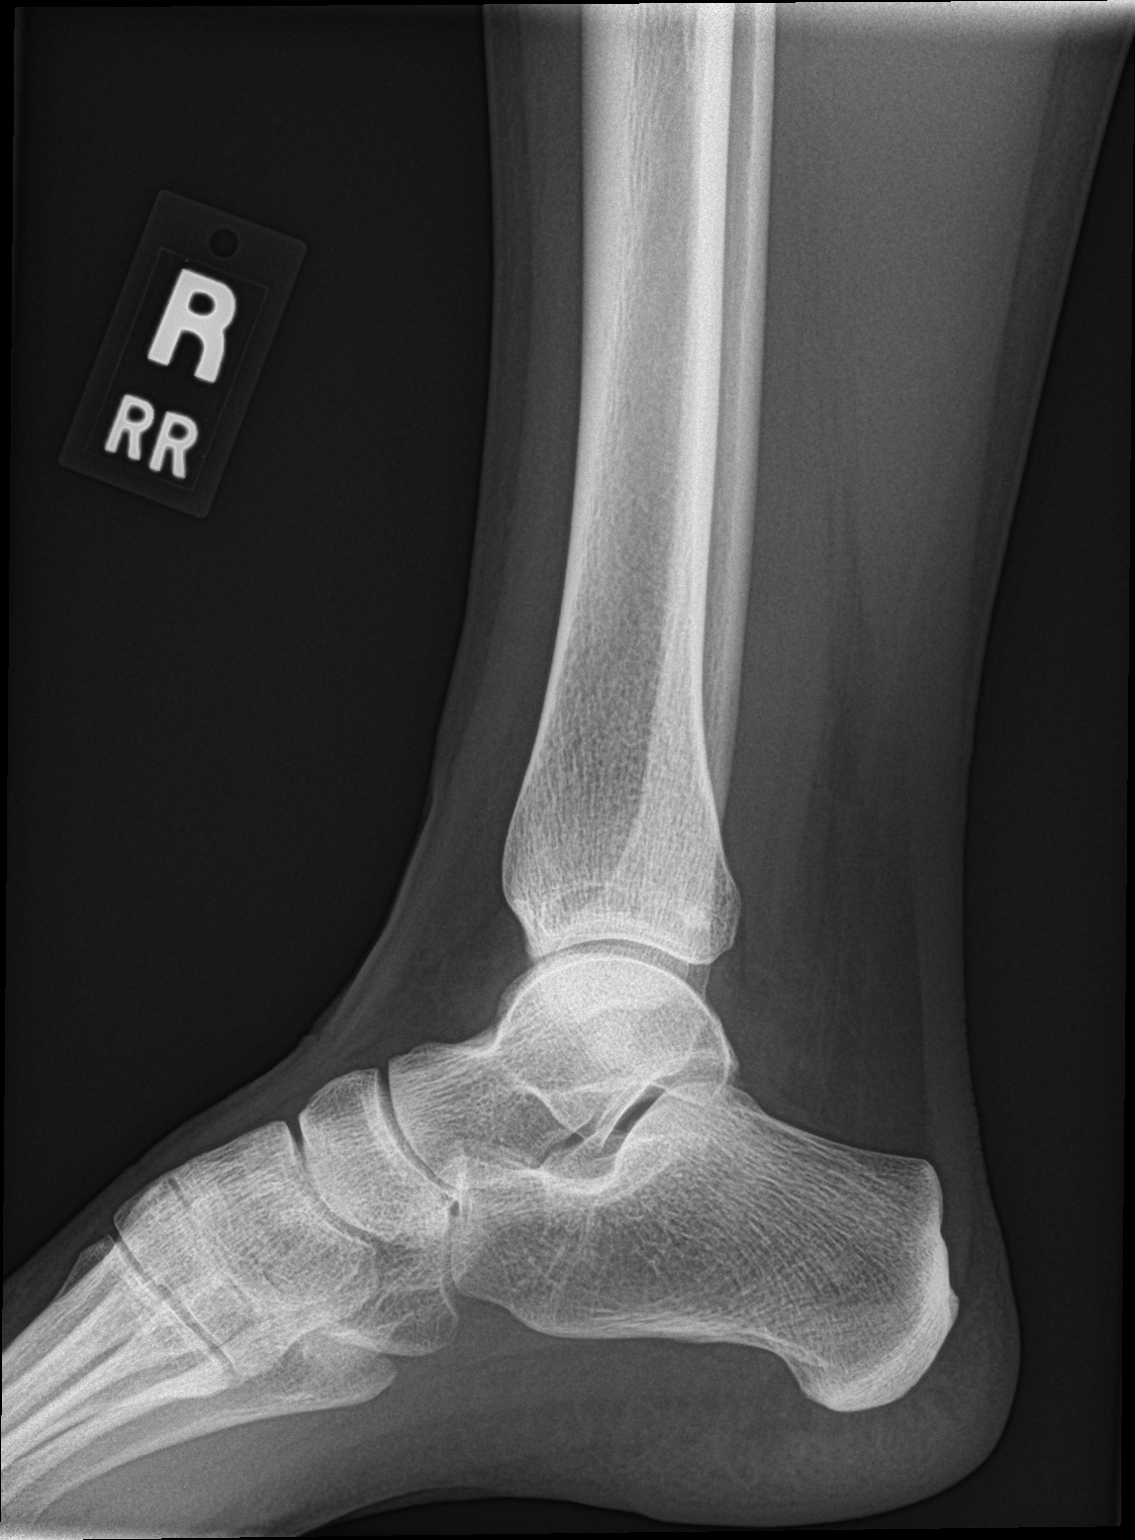

[3 of 3 positions shown; findings below may reference images not displayed]

FINDINGS: The bones are subjectively adequately mineralized. The ankle joint
mortise is preserved. The talar dome is intact. There is no acute
malleolar fracture. The talus and calcaneus are unremarkable. The
metatarsal bases are intact where visualized. There is mild soft
tissue swelling anteriorly and laterally.
IMPRESSION: There is no acute fracture nor dislocation. There is mild soft
tissue swelling anteriorly and laterally.

## 2016-07-25 MED ORDER — NAPROXEN SODIUM 550 MG PO TABS: 550.0000 mg | ORAL_TABLET | Freq: Two times a day (BID) | ORAL | 1 refills | Status: DC

## 2016-07-25 NOTE — Progress Notes (Signed)
    MRN: [id number] DOB: 04-Dec-1980  Subjective:   [name] is a 35 y.o. female presenting for chief complaint of Ankle Injury (rolled right ankle yesterday)  Reports 1 day history of right ankle sprain while moving to a new home. Patient rolled her ankle outwardly, heard a pop. She had severe pain at the time, had ankle swelling, could not bear weight. She has iced, wrapped her ankle and propped it up. Today, she is able to ambulate while using a crutch. Reports history of right ankle fracture as a young child.  Cassandra LimHolly has a current medication list which includes the following prescription(s): albuterol, calcium citrate-vitamin d, cyproheptadine, levonorgestrel, multivitamin, probiotic product, qnasl, thyroid, valacyclovir, and pantoprazole, and the following Facility-Administered Medications: sodium chloride. Also is allergic to erythromycin and penicillins.  Cassandra LimHolly  has a past medical history of Chronic headaches; IBS (irritable bowel syndrome); and Thyroid disease. Also  has a past surgical history that includes Diagnostic laparoscopy and wisom teeth.   Objective:   Vitals: BP 110/68   Pulse 85   Temp 98.5 F (36.9 C) (Oral)   Resp 18   Ht 5' (1.524 m)   Wt 99 lb 12.8 oz (45.3 kg)   SpO2 99%   BMI 19.49 kg/m   Physical Exam  Constitutional: She is oriented to person, place, and time. She appears well-developed and well-nourished.  Cardiovascular: Normal rate.   Pulmonary/Chest: Effort normal.  Musculoskeletal:       Right ankle: She exhibits decreased range of motion (eversion of foot), swelling and ecchymosis (lateral malleolus). She exhibits no deformity and no laceration. Tenderness. Lateral malleolus and AITFL tenderness found. No medial malleolus, no posterior TFL, no head of 5th metatarsal and no proximal fibula tenderness found. Achilles tendon exhibits no pain and no defect.  Neurological: She is alert and oriented to person, place, and time.   Dg Ankle Complete  Right  Result Date: 07/25/2016 CLINICAL DATA:  Twisting injury of the ankle yesterday with persistent ankle discomfort. EXAM: RIGHT ANKLE - COMPLETE 3+ VIEW COMPARISON:  None in PACs FINDINGS: The bones are subjectively adequately mineralized. The ankle joint mortise is preserved. The talar dome is intact. There is no acute malleolar fracture. The talus and calcaneus are unremarkable. The metatarsal bases are intact where visualized. There is mild soft tissue swelling anteriorly and laterally. IMPRESSION: There is no acute fracture nor dislocation. There is mild soft tissue swelling anteriorly and laterally. Electronically Signed   By: David  SwazilandJordan M.D.   On: 07/25/2016 12:54   Assessment and Plan :   1. Sprain of anterior talofibular ligament of right ankle, initial encounter 2. Pain in joint involving right ankle and foot - Recommended RICE method, reviewed ankle rehab. Anaprox for pain and inflammation. RTC in 2 weeks if no improvement.  Cassandra BambergMario Tyleigh Mahn, PA-C [facility] [phone] 07/25/2016 12:34 PM

## 2016-07-25 NOTE — Patient Instructions (Addendum)
Ankle Sprain An ankle sprain is a stretch or tear in one of the tough, fiber-like tissues (ligaments) in the ankle. The ligaments in your ankle help to hold the bones of the ankle together. What are the causes? This condition is often caused by stepping on or falling on the outer edge of the foot. What increases the risk? This condition is more likely to develop in people who play sports. What are the signs or symptoms? Symptoms of this condition include:  Pain in your ankle.  Swelling.  Bruising. Bruising may develop right after you sprain your ankle or 1-2 days later.  Trouble standing or walking, especially when you turn or change directions. How is this diagnosed? This condition is diagnosed with a physical exam. During the exam, your health care provider will press on certain parts of your foot and ankle and try to move them in certain ways. X-rays may be taken to see how severe the sprain is and to check for broken bones. How is this treated? This condition may be treated with:  A brace. This is used to keep the ankle from moving until it heals.  An elastic bandage. This is used to support the ankle.  Crutches.  Pain medicine.  Surgery. This may be needed if the sprain is severe.  Physical therapy. This may help to improve the range of motion in the ankle. Follow these instructions at home:  Rest your ankle.  Take over-the-counter and prescription medicines only as told by your health care provider.  For 2-3 days, keep your ankle raised (elevated) above the level of your heart as much as possible.  If directed, apply ice to the area:  Put ice in a plastic bag.  Place a towel between your skin and the bag.  Leave the ice on for 20 minutes, 2-3 times a day.  If you were given a brace:  Wear it as directed.  Remove it to shower or bathe.  Try not to move your ankle much, but wiggle your toes from time to time. This helps to prevent swelling.  If you were  given an elastic bandage (dressing):  Remove it to shower or bathe.  Try not to move your ankle much, but wiggle your toes from time to time. This helps to prevent swelling.  Adjust the dressing to make it more comfortable if it feels too tight.  Loosen the dressing if you have numbness or tingling in your foot, or if your foot becomes cold and blue.  If you have crutches, use them as told by your health care provider. Continue to use them until you can walk without feeling pain in your ankle. Contact a health care provider if:  You have rapidly increasing bruising or swelling.  Your pain is not relieved with medicine. Get help right away if:  Your toes or foot becomes numb or blue.  You have severe pain that gets worse. This information is not intended to replace advice given to you by your health care provider. Make sure you discuss any questions you have with your health care provider. Document Released: 08/22/2005 Document Revised: 12/30/2015 Document Reviewed: 03/24/2015 Elsevier Interactive Patient Education  2017 [employer] Inc.    Elastic Bandage and RICE WHAT DOES AN ELASTIC BANDAGE DO? Elastic bandages come in different shapes and sizes. They generally provide support to your injury and reduce swelling while you are healing, but they can perform different functions. Your health care provider will help you to decide what is  best for your protection, recovery, or rehabilitation following an injury. WHAT ARE SOME GENERAL TIPS FOR USING AN ELASTIC BANDAGE?  Use the bandage as directed by the maker of the bandage that you are using.  Do not wrap the bandage too tightly. This may cut off the circulation in the arm or leg in the area below the bandage.  If part of your body beyond the bandage becomes blue, numb, cold, swollen, or is more painful, your bandage is most likely too tight. If this occurs, remove your bandage and reapply it more loosely.  See your health care  provider if the bandage seems to be making your problems worse rather than better.  An elastic bandage should be removed and reapplied every 3-4 hours or as directed by your health care provider. WHAT IS RICE? The routine care of many injuries includes rest, ice, compression, and elevation (RICE therapy). Rest Rest is required to allow your body to heal. Generally, you can resume your routine activities when you are comfortable and have been given permission by your health care provider. Ice Icing your injury helps to keep the swelling down and it reduces pain. Do not apply ice directly to your skin.  Put ice in a plastic bag.  Place a towel between your skin and the bag.  Leave the ice on for 20 minutes, 2-3 times per day. Do this for as long as you are directed by your health care provider. Compression Compression helps to keep swelling down, gives support, and helps with discomfort. Compression may be done with an elastic bandage. Elevation Elevation helps to reduce swelling and it decreases pain. If possible, your injured area should be placed at or above the level of your heart or the center of your chest. WHEN SHOULD I SEEK MEDICAL CARE? You should seek medical care if:  You have persistent pain and swelling.  Your symptoms are getting worse rather than improving. These symptoms may indicate that further evaluation or further X-rays are needed. Sometimes, X-rays may not show a small broken bone (fracture) until a number of days later. Make a follow-up appointment with your health care provider. Ask when your X-ray results will be ready. Make sure that you get your X-ray results. WHEN SHOULD I SEEK IMMEDIATE MEDICAL CARE? You should seek immediate medical care if:  You have a sudden onset of severe pain at or below the area of your injury.  You develop redness or increased swelling around your injury.  You have tingling or numbness at or below the area of your injury that does  not improve after you remove the elastic bandage. This information is not intended to replace advice given to you by your health care provider. Make sure you discuss any questions you have with your health care provider. Document Released: 02/11/2002 Document Revised: 01/28/2016 Document Reviewed: 04/07/2014 Elsevier Interactive Patient Education  2017 [employer]   IF you received an x-ray today, you will receive an invoice from [facility]. Please contact [facility] at [phone] with questions or concerns regarding your invoice.   IF you received labwork today, you will receive an invoice from [facility]. Please contact [facility] at [phone] with questions or concerns regarding your invoice.   Our billing staff will not be able to assist you with questions regarding bills from these companies.  You will be contacted with the lab results as soon as they are available. The fastest way to get your results is to activate your My  Chart account. Instructions are located on the last page of this paperwork. If you have not heard from [place] regarding the results in 2 weeks, please contact this office.

## 2016-07-30 ENCOUNTER — Ambulatory Visit (INDEPENDENT_AMBULATORY_CARE_PROVIDER_SITE_OTHER): Payer: BLUE CROSS/BLUE SHIELD | Admitting: Family Medicine

## 2016-07-30 VITALS — BP 112/76 | HR 94 | Temp 99.1°F | Resp 17 | Ht 60.0 in | Wt 101.0 lb

## 2016-07-30 DIAGNOSIS — S93401D Sprain of unspecified ligament of right ankle, subsequent encounter: Secondary | ICD-10-CM | POA: Diagnosis not present

## 2016-07-30 DIAGNOSIS — L089 Local infection of the skin and subcutaneous tissue, unspecified: Secondary | ICD-10-CM

## 2016-07-30 LAB — POCT CBC
Granulocyte percent: 69 %G (ref 37–80)
HCT, POC: 39 % (ref 37.7–47.9)
Hemoglobin: 13.9 g/dL (ref 12.2–16.2)
Lymph, poc: 1.6 (ref 0.6–3.4)
MCH, POC: 32.7 pg — AB (ref 27–31.2)
MCHC: 35.8 g/dL — AB (ref 31.8–35.4)
MCV: 91.4 fL (ref 80–97)
MID (cbc): 0.3 (ref 0–0.9)
MPV: 9.3 fL (ref 0–99.8)
POC Granulocyte: 4.1 (ref 2–6.9)
POC LYMPH PERCENT: 26.3 %L (ref 10–50)
POC MID %: 4.7 %M (ref 0–12)
Platelet Count, POC: 118 10*3/uL — AB (ref 142–424)
RBC: 4.26 M/uL (ref 4.04–5.48)
RDW, POC: 12.7 %
WBC: 5.9 10*3/uL (ref 4.6–10.2)

## 2016-07-30 LAB — POCT SEDIMENTATION RATE: POCT SED RATE: 6 mm/hr (ref 0–22)

## 2016-07-30 MED ORDER — CEPHALEXIN 500 MG PO CAPS
500.0000 mg | ORAL_CAPSULE | Freq: Three times a day (TID) | ORAL | 0 refills | Status: DC
Start: 2016-07-30 — End: 2021-09-23

## 2016-07-30 NOTE — Progress Notes (Signed)
Patient ID: Cassandra Jefferson, female    DOB: February 16, 1981  Age: 35 y.o. MRN: [id number]  Chief Complaint  Patient presents with  . Follow-up    Sprained ankle. Pt now states her 2nd toe is inflamed.     Subjective:   A week ago the patient turned her right ankle, straining the lateral aspect. She has been wearing a splint. She also has some compression leg hose on. This morning she had pain in the right second toe and it has been a pink color as opposed to the other digits which have a little more of a cool bluish discoloration. The ankle itself is not been swelling a lot but the foot feels puffy in that area where the color change exists. She wondered whether she could have something like some gout.  Current allergies, medications, problem list, past/family and social histories reviewed.  Objective:  BP 112/76 (BP Location: Right Arm, Patient Position: Sitting, Cuff Size: Normal)   Pulse 94   Temp 99.1 F (37.3 C) (Oral)   Resp 17   Ht 5' (1.524 m)   Wt 101 lb (45.8 kg)   SpO2 97%   BMI 19.73 kg/m   No major acute distress. Tenderness in the lateral aspect right ankle below the lateral malleolus. The pulses of both feet are symmetrical, cool and weak but definitely present. The right second toe is pink. Warm or than the other toes. A little bit of swelling. The toenail is fairly curved in a "C" like fashion but no major ingrowing. No pus is present.  Assessment & Plan:   Assessment: 1. Inflammation of toe   2. Sprain of right ankle, unspecified ligament, subsequent encounter       Plan: Results for orders placed or performed in visit on 07/30/16  POCT CBC  Result Value Ref Range   WBC 5.9 4.6 - 10.2 K/uL   Lymph, poc 1.6 0.6 - 3.4   POC LYMPH PERCENT 26.3 10 - 50 %L   MID (cbc) 0.3 0 - 0.9   POC MID % 4.7 0 - 12 %M   POC Granulocyte 4.1 2 - 6.9   Granulocyte percent 69.0 37 - 80 %G   RBC 4.26 4.04 - 5.48 M/uL   Hemoglobin 13.9 12.2 - 16.2 g/dL   HCT, POC 40.939.0 81.137.7 - 47.9 %    MCV 91.4 80 - 97 fL   MCH, POC 32.7 (A) 27 - 31.2 pg   MCHC 35.8 (A) 31.8 - 35.4 g/dL   RDW, POC 91.412.7 %   Platelet Count, POC 118 (A) 142 - 424 K/uL   MPV 9.3 0 - 99.8 fL  POCT SEDIMENTATION RATE  Result Value Ref Range   POCT SED RATE 6 0 - 22 mm/hr    Will treat with Keflex as I think this is starting to get infected. He is allergic to penicillin but has tolerated Keflex primarily the past she tells me.  Orders Placed This Encounter  Procedures  . POCT CBC  . POCT SEDIMENTATION RATE    No orders of the defined types were placed in this encounter.        Patient Instructions   I do not think that the sprain caused this, though I cannot be sure. Continue to give the spring time.  If the toe is getting worse with more swelling or redness or any pus draining come back for a recheck. If the sedimentation rate is significantly abnormally high I will let you know. It is  simply a measure of inflammation that we can follow sometimes.  Take cephalexin (Keflex) one 3 times daily for 5 days.  IF you received an x-ray today, you will receive an invoice from [facility]. Please contact [facility] at [phone] with questions or concerns regarding your invoice.   IF you received labwork today, you will receive an invoice from [facility]. Please contact [facility] at [phone] with questions or concerns regarding your invoice.   Our billing staff will not be able to assist you with questions regarding bills from these companies.  You will be contacted with the lab results as soon as they are available. The fastest way to get your results is to activate your My Chart account. Instructions are located on the last page of this paperwork. If you have not heard from us regarding the results in 2 weeks, please contact this office.         Return if symptoms worsen or fail to improve.   Maudine Kluesner, MD 07/30/2016

## 2016-07-30 NOTE — Patient Instructions (Addendum)
I do not think that the sprain caused this, though I cannot be sure. Continue to give the spring time.  If the toe is getting worse with more swelling or redness or any pus draining come back for a recheck. If the sedimentation rate is significantly abnormally high I will let you know. It is simply a measure of inflammation that we can follow sometimes.  Take cephalexin (Keflex) one 3 times daily for 5 days.  IF you received an x-ray today, you will receive an invoice from [facility]. Please contact [facility] at [phone] with questions or concerns regarding your invoice.   IF you received labwork today, you will receive an invoice from [facility]. Please contact [facility] at [phone] with questions or concerns regarding your invoice.   Our billing staff will not be able to assist you with questions regarding bills from these companies.  You will be contacted with the lab results as soon as they are available. The fastest way to get your results is to activate your My Chart account. Instructions are located on the last page of this paperwork. If you have not heard from us regarding the results in 2 weeks, please contact this office.

## 2017-01-18 DIAGNOSIS — E039 Hypothyroidism, unspecified: Secondary | ICD-10-CM | POA: Diagnosis not present

## 2017-01-18 DIAGNOSIS — Z13220 Encounter for screening for lipoid disorders: Secondary | ICD-10-CM | POA: Diagnosis not present

## 2017-01-25 DIAGNOSIS — Z681 Body mass index (BMI) 19 or less, adult: Secondary | ICD-10-CM | POA: Diagnosis not present

## 2017-01-25 DIAGNOSIS — Z23 Encounter for immunization: Secondary | ICD-10-CM | POA: Diagnosis not present

## 2017-01-25 DIAGNOSIS — J45909 Unspecified asthma, uncomplicated: Secondary | ICD-10-CM | POA: Diagnosis not present

## 2017-01-25 DIAGNOSIS — J309 Allergic rhinitis, unspecified: Secondary | ICD-10-CM | POA: Diagnosis not present

## 2017-01-25 DIAGNOSIS — E039 Hypothyroidism, unspecified: Secondary | ICD-10-CM | POA: Diagnosis not present

## 2017-03-22 DIAGNOSIS — J309 Allergic rhinitis, unspecified: Secondary | ICD-10-CM | POA: Diagnosis not present

## 2017-03-22 DIAGNOSIS — Z681 Body mass index (BMI) 19 or less, adult: Secondary | ICD-10-CM | POA: Diagnosis not present

## 2017-03-22 DIAGNOSIS — R05 Cough: Secondary | ICD-10-CM | POA: Diagnosis not present

## 2017-05-03 DIAGNOSIS — Z6820 Body mass index (BMI) 20.0-20.9, adult: Secondary | ICD-10-CM | POA: Diagnosis not present

## 2017-05-03 DIAGNOSIS — Z01419 Encounter for gynecological examination (general) (routine) without abnormal findings: Secondary | ICD-10-CM | POA: Diagnosis not present

## 2017-05-10 DIAGNOSIS — D1801 Hemangioma of skin and subcutaneous tissue: Secondary | ICD-10-CM | POA: Diagnosis not present

## 2017-05-10 DIAGNOSIS — L814 Other melanin hyperpigmentation: Secondary | ICD-10-CM | POA: Diagnosis not present

## 2017-05-10 DIAGNOSIS — L821 Other seborrheic keratosis: Secondary | ICD-10-CM | POA: Diagnosis not present

## 2017-05-10 DIAGNOSIS — D225 Melanocytic nevi of trunk: Secondary | ICD-10-CM | POA: Diagnosis not present

## 2017-05-24 DIAGNOSIS — H5202 Hypermetropia, left eye: Secondary | ICD-10-CM | POA: Diagnosis not present

## 2017-05-24 DIAGNOSIS — H33301 Unspecified retinal break, right eye: Secondary | ICD-10-CM | POA: Diagnosis not present

## 2017-05-24 DIAGNOSIS — H01004 Unspecified blepharitis left upper eyelid: Secondary | ICD-10-CM | POA: Diagnosis not present

## 2017-05-24 DIAGNOSIS — H52203 Unspecified astigmatism, bilateral: Secondary | ICD-10-CM | POA: Diagnosis not present

## 2017-07-28 DIAGNOSIS — Z23 Encounter for immunization: Secondary | ICD-10-CM | POA: Diagnosis not present

## 2017-09-27 DIAGNOSIS — Z114 Encounter for screening for human immunodeficiency virus [HIV]: Secondary | ICD-10-CM | POA: Diagnosis not present

## 2017-09-27 DIAGNOSIS — Z13220 Encounter for screening for lipoid disorders: Secondary | ICD-10-CM | POA: Diagnosis not present

## 2017-09-27 DIAGNOSIS — Z0000 Encounter for general adult medical examination without abnormal findings: Secondary | ICD-10-CM | POA: Diagnosis not present

## 2017-09-27 DIAGNOSIS — Z1329 Encounter for screening for other suspected endocrine disorder: Secondary | ICD-10-CM | POA: Diagnosis not present

## 2017-10-04 DIAGNOSIS — Z0000 Encounter for general adult medical examination without abnormal findings: Secondary | ICD-10-CM | POA: Diagnosis not present

## 2017-10-04 DIAGNOSIS — Z681 Body mass index (BMI) 19 or less, adult: Secondary | ICD-10-CM | POA: Diagnosis not present

## 2017-10-13 DIAGNOSIS — J0100 Acute maxillary sinusitis, unspecified: Secondary | ICD-10-CM | POA: Diagnosis not present

## 2017-10-13 DIAGNOSIS — J069 Acute upper respiratory infection, unspecified: Secondary | ICD-10-CM | POA: Diagnosis not present

## 2017-10-13 DIAGNOSIS — Z681 Body mass index (BMI) 19 or less, adult: Secondary | ICD-10-CM | POA: Diagnosis not present

## 2017-11-15 DIAGNOSIS — E039 Hypothyroidism, unspecified: Secondary | ICD-10-CM | POA: Diagnosis not present

## 2017-11-22 DIAGNOSIS — E039 Hypothyroidism, unspecified: Secondary | ICD-10-CM | POA: Diagnosis not present

## 2017-11-22 DIAGNOSIS — Z681 Body mass index (BMI) 19 or less, adult: Secondary | ICD-10-CM | POA: Diagnosis not present

## 2018-04-30 DIAGNOSIS — E039 Hypothyroidism, unspecified: Secondary | ICD-10-CM | POA: Diagnosis not present

## 2018-05-02 DIAGNOSIS — Z681 Body mass index (BMI) 19 or less, adult: Secondary | ICD-10-CM | POA: Diagnosis not present

## 2018-05-02 DIAGNOSIS — L709 Acne, unspecified: Secondary | ICD-10-CM | POA: Diagnosis not present

## 2018-05-02 DIAGNOSIS — E039 Hypothyroidism, unspecified: Secondary | ICD-10-CM | POA: Diagnosis not present

## 2018-05-02 DIAGNOSIS — B001 Herpesviral vesicular dermatitis: Secondary | ICD-10-CM | POA: Diagnosis not present

## 2018-06-22 DIAGNOSIS — H5202 Hypermetropia, left eye: Secondary | ICD-10-CM | POA: Diagnosis not present

## 2018-09-03 DIAGNOSIS — Z6820 Body mass index (BMI) 20.0-20.9, adult: Secondary | ICD-10-CM | POA: Diagnosis not present

## 2018-09-03 DIAGNOSIS — Z01419 Encounter for gynecological examination (general) (routine) without abnormal findings: Secondary | ICD-10-CM | POA: Diagnosis not present

## 2018-10-15 DIAGNOSIS — Z1329 Encounter for screening for other suspected endocrine disorder: Secondary | ICD-10-CM | POA: Diagnosis not present

## 2018-10-15 DIAGNOSIS — Z114 Encounter for screening for human immunodeficiency virus [HIV]: Secondary | ICD-10-CM | POA: Diagnosis not present

## 2018-10-15 DIAGNOSIS — Z0000 Encounter for general adult medical examination without abnormal findings: Secondary | ICD-10-CM | POA: Diagnosis not present

## 2018-10-15 DIAGNOSIS — Z13220 Encounter for screening for lipoid disorders: Secondary | ICD-10-CM | POA: Diagnosis not present

## 2018-10-22 DIAGNOSIS — Z6820 Body mass index (BMI) 20.0-20.9, adult: Secondary | ICD-10-CM | POA: Diagnosis not present

## 2018-10-22 DIAGNOSIS — Z0000 Encounter for general adult medical examination without abnormal findings: Secondary | ICD-10-CM | POA: Diagnosis not present

## 2019-01-29 DIAGNOSIS — E039 Hypothyroidism, unspecified: Secondary | ICD-10-CM | POA: Diagnosis not present

## 2019-01-29 DIAGNOSIS — E559 Vitamin D deficiency, unspecified: Secondary | ICD-10-CM | POA: Diagnosis not present

## 2019-02-01 DIAGNOSIS — E039 Hypothyroidism, unspecified: Secondary | ICD-10-CM | POA: Diagnosis not present

## 2019-02-01 DIAGNOSIS — E559 Vitamin D deficiency, unspecified: Secondary | ICD-10-CM | POA: Diagnosis not present

## 2019-02-01 DIAGNOSIS — B009 Herpesviral infection, unspecified: Secondary | ICD-10-CM | POA: Diagnosis not present

## 2019-05-08 DIAGNOSIS — L821 Other seborrheic keratosis: Secondary | ICD-10-CM | POA: Diagnosis not present

## 2019-05-08 DIAGNOSIS — Z808 Family history of malignant neoplasm of other organs or systems: Secondary | ICD-10-CM | POA: Diagnosis not present

## 2019-05-08 DIAGNOSIS — L814 Other melanin hyperpigmentation: Secondary | ICD-10-CM | POA: Diagnosis not present

## 2019-05-08 DIAGNOSIS — D225 Melanocytic nevi of trunk: Secondary | ICD-10-CM | POA: Diagnosis not present

## 2019-07-03 ENCOUNTER — Other Ambulatory Visit: Payer: Self-pay

## 2019-07-03 DIAGNOSIS — Z20822 Contact with and (suspected) exposure to covid-19: Secondary | ICD-10-CM

## 2019-07-04 LAB — NOVEL CORONAVIRUS, NAA: SARS-CoV-2, NAA: NOT DETECTED

## 2019-07-18 DIAGNOSIS — E039 Hypothyroidism, unspecified: Secondary | ICD-10-CM | POA: Diagnosis not present

## 2019-07-22 DIAGNOSIS — J309 Allergic rhinitis, unspecified: Secondary | ICD-10-CM | POA: Diagnosis not present

## 2019-07-22 DIAGNOSIS — E039 Hypothyroidism, unspecified: Secondary | ICD-10-CM | POA: Diagnosis not present

## 2019-07-22 DIAGNOSIS — L709 Acne, unspecified: Secondary | ICD-10-CM | POA: Diagnosis not present

## 2019-07-30 DIAGNOSIS — Z23 Encounter for immunization: Secondary | ICD-10-CM | POA: Diagnosis not present

## 2019-10-11 DIAGNOSIS — Z01419 Encounter for gynecological examination (general) (routine) without abnormal findings: Secondary | ICD-10-CM | POA: Diagnosis not present

## 2019-10-11 DIAGNOSIS — Z6821 Body mass index (BMI) 21.0-21.9, adult: Secondary | ICD-10-CM | POA: Diagnosis not present

## 2020-03-31 DIAGNOSIS — E039 Hypothyroidism, unspecified: Secondary | ICD-10-CM | POA: Diagnosis not present

## 2020-06-16 DIAGNOSIS — L570 Actinic keratosis: Secondary | ICD-10-CM | POA: Diagnosis not present

## 2020-06-16 DIAGNOSIS — L820 Inflamed seborrheic keratosis: Secondary | ICD-10-CM | POA: Diagnosis not present

## 2020-06-16 DIAGNOSIS — L821 Other seborrheic keratosis: Secondary | ICD-10-CM | POA: Diagnosis not present

## 2020-10-22 DIAGNOSIS — Z01419 Encounter for gynecological examination (general) (routine) without abnormal findings: Secondary | ICD-10-CM | POA: Diagnosis not present

## 2020-10-22 DIAGNOSIS — R059 Cough, unspecified: Secondary | ICD-10-CM | POA: Diagnosis not present

## 2020-10-22 DIAGNOSIS — Z681 Body mass index (BMI) 19 or less, adult: Secondary | ICD-10-CM | POA: Diagnosis not present

## 2020-10-22 DIAGNOSIS — Z760 Encounter for issue of repeat prescription: Secondary | ICD-10-CM | POA: Diagnosis not present

## 2021-03-01 DIAGNOSIS — Z1231 Encounter for screening mammogram for malignant neoplasm of breast: Secondary | ICD-10-CM | POA: Diagnosis not present

## 2021-03-01 DIAGNOSIS — Z30433 Encounter for removal and reinsertion of intrauterine contraceptive device: Secondary | ICD-10-CM | POA: Diagnosis not present

## 2021-04-13 DIAGNOSIS — Z30431 Encounter for routine checking of intrauterine contraceptive device: Secondary | ICD-10-CM | POA: Diagnosis not present

## 2021-08-11 DIAGNOSIS — H93A1 Pulsatile tinnitus, right ear: Secondary | ICD-10-CM | POA: Diagnosis not present

## 2021-08-11 DIAGNOSIS — H9311 Tinnitus, right ear: Secondary | ICD-10-CM | POA: Diagnosis not present

## 2021-08-16 ENCOUNTER — Other Ambulatory Visit: Payer: Self-pay | Admitting: Physician Assistant

## 2021-08-16 DIAGNOSIS — H93A1 Pulsatile tinnitus, right ear: Secondary | ICD-10-CM

## 2021-08-26 ENCOUNTER — Other Ambulatory Visit: Payer: Self-pay | Admitting: Physician Assistant

## 2021-08-26 DIAGNOSIS — H93A1 Pulsatile tinnitus, right ear: Secondary | ICD-10-CM

## 2021-09-10 ENCOUNTER — Ambulatory Visit
Admission: RE | Admit: 2021-09-10 | Discharge: 2021-09-10 | Disposition: A | Discharge status: Home / Self Care | Payer: BC Managed Care – PPO | Source: Ambulatory Visit | Attending: Physician Assistant | Admitting: Physician Assistant

## 2021-09-10 ENCOUNTER — Other Ambulatory Visit: Payer: Self-pay

## 2021-09-10 DIAGNOSIS — H93A1 Pulsatile tinnitus, right ear: Secondary | ICD-10-CM

## 2021-09-10 IMAGING — MR MR MRA NECK WO/W CM
4 of 6 series · 34 of 48 positions shown · IV contrast (10ML MULTIHANCE)
Comparison: None

CLINICAL DATA: Pulsatile tinnitus of right ear

EXAM:
MRI HEAD WITHOUT AND WITH CONTRAST
MRA HEAD WITHOUT CONTRAST
MRA NECK WITHOUT AND WITH CONTRAST
TECHNIQUE: Multiplanar, multi-echo pulse sequences of the brain and surrounding
structures were acquired without and with intravenous contrast.
Angiographic images of the Circle of Willis were acquired using MRA
technique without intravenous contrast. Angiographic images of the
neck were acquired using MRA technique without and with intravenous
contrast. Carotid stenosis measurements (when applicable) are
obtained utilizing NASCET criteria, using the distal internal
carotid diameter as the denominator.
CONTRAST:  9mL MULTIHANCE GADOBENATE DIMEGLUMINE 529 MG/ML IV SOLN

[Series 4: fl_tof_2d · axial · 3.0mm · 0.39mm/px · z∈[-178,-80]mm · 7 of 50 slices shown]
[im 1/50]
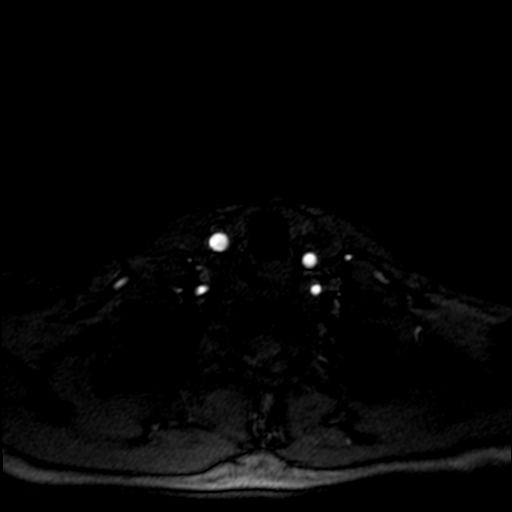
[im 9/50]
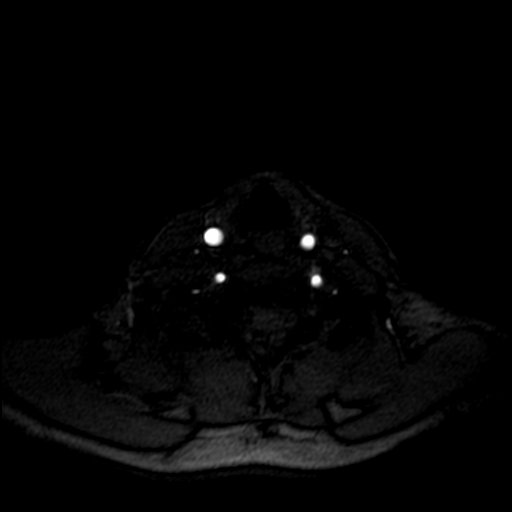
[im 17/50]
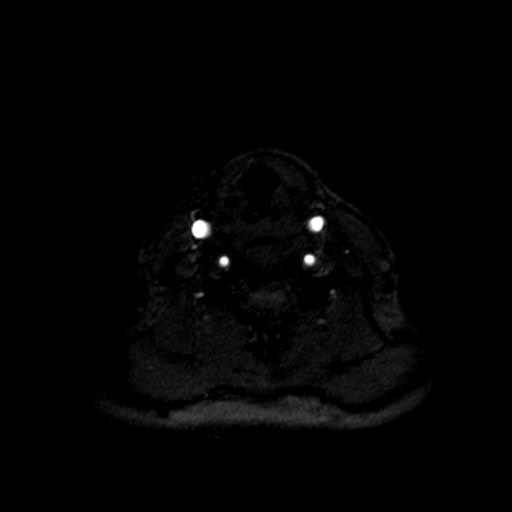
[im 25/50]
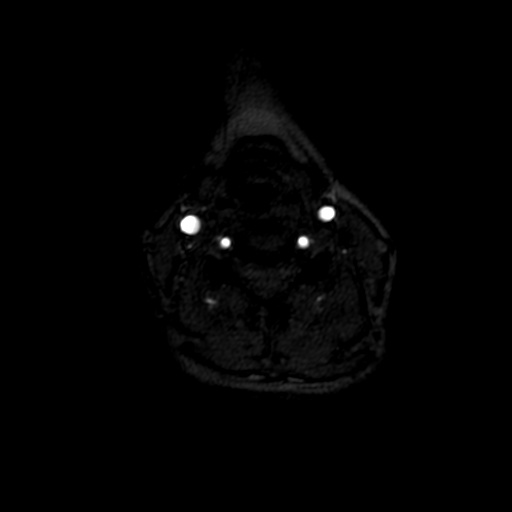
[im 33/50]
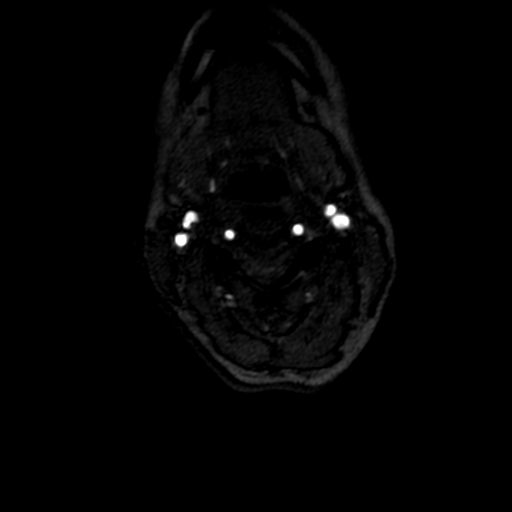
[im 41/50]
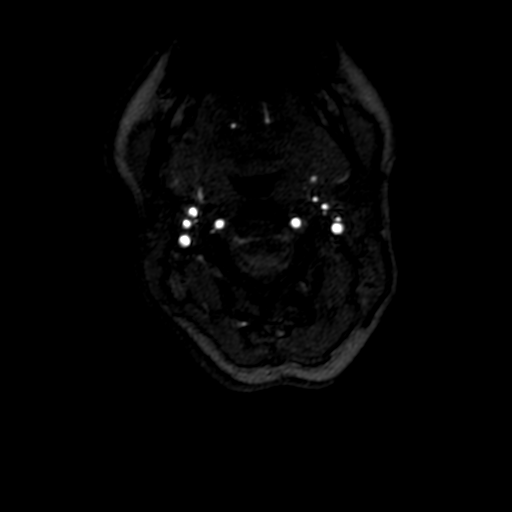
[im 50/50]
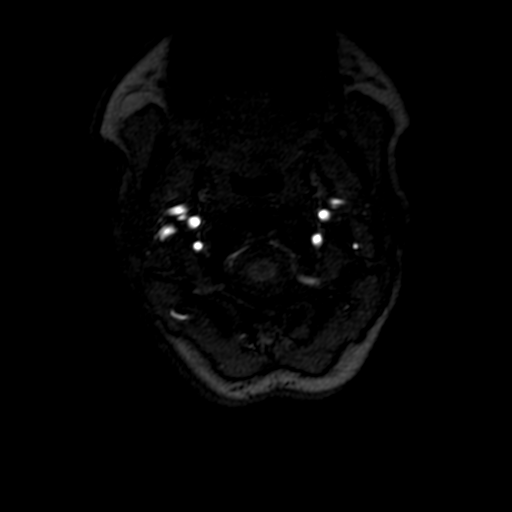

[Series 8: (id)_tt=1.0s · coronal · 0.8mm · 0.78mm/px · 9 of 87 slices shown (1 of 2)]
[im 1/87]
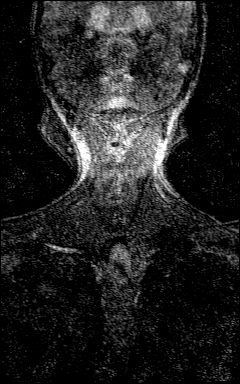
[im 13/87]
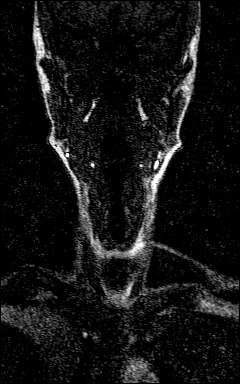
[im 25/87]
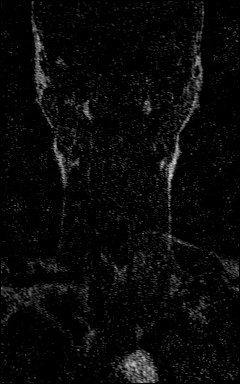
[im 37/87]
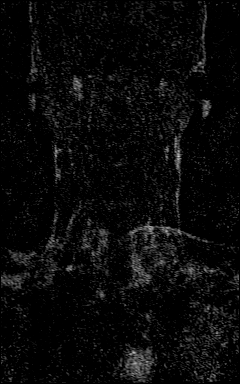
[im 44/87]
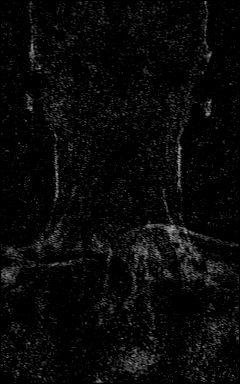
[im 50/87]
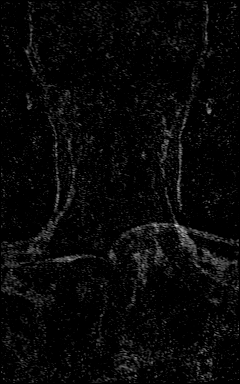
[im 62/87]
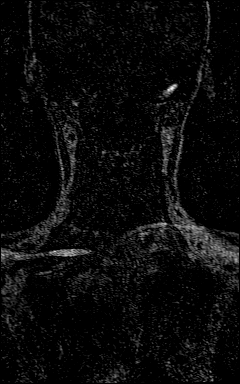
[im 74/87]
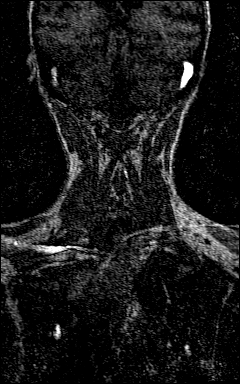
[im 87/87]
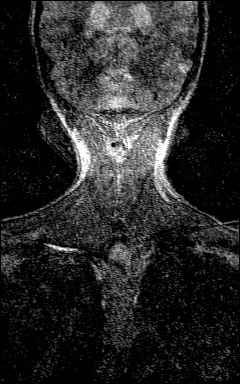

[Series 10: (id)_tt=1.0s · coronal · 0.8mm · 0.78mm/px · 9 of 69 slices shown (2 of 2)]
[im 1/69]
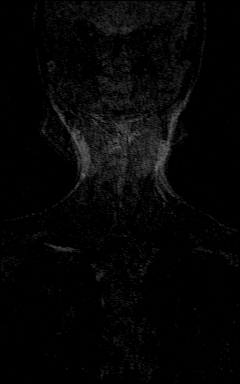
[im 13/69]
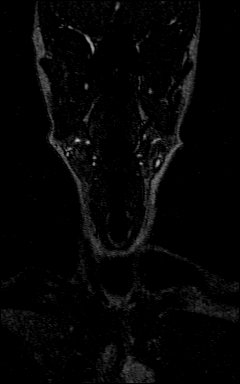
[im 19/69]
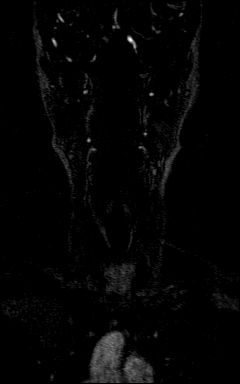
[im 31/69]
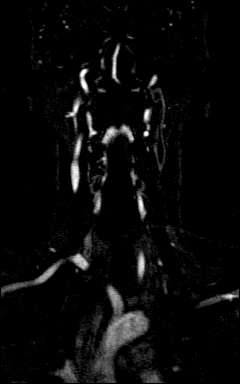
[im 38/69]
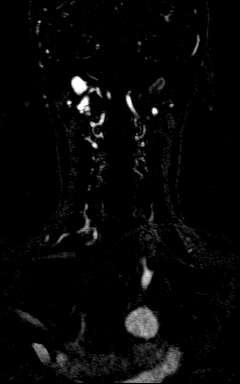
[im 50/69]
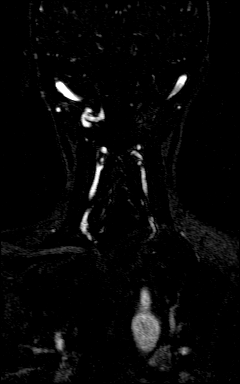
[im 56/69]
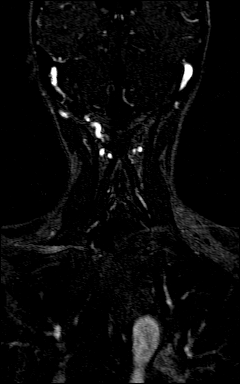
[im 62/69]
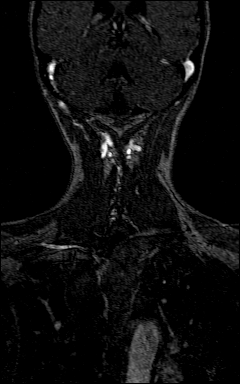
[im 69/69]
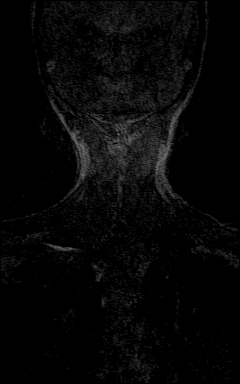

[Series 11: (id)_tt=1.0s_sub · coronal · 0.8mm · 0.78mm/px · 9 of 73 slices shown]
[im 1/73]
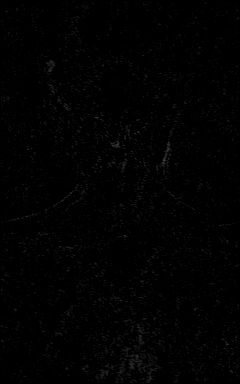
[im 14/73]
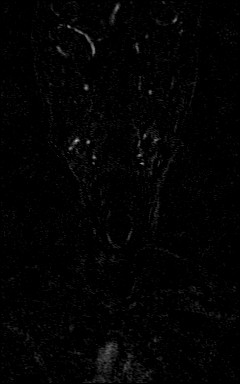
[im 20/73]
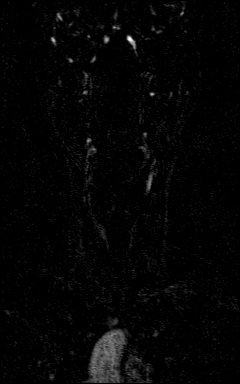
[im 33/73]
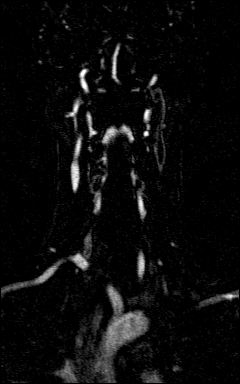
[im 40/73]
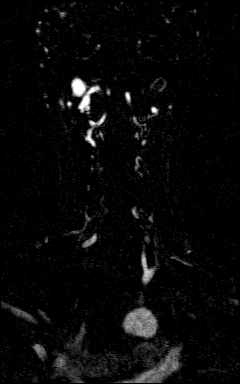
[im 53/73]
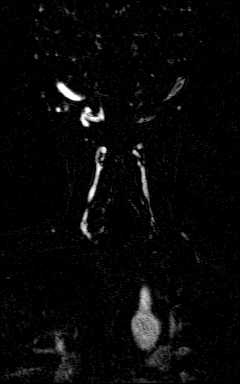
[im 59/73]
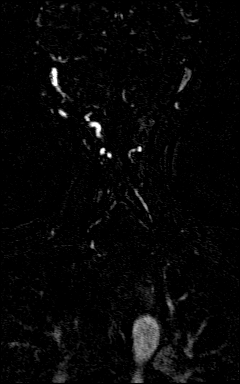
[im 66/73]
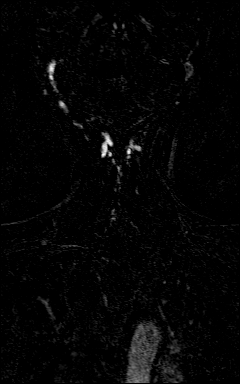
[im 73/73]
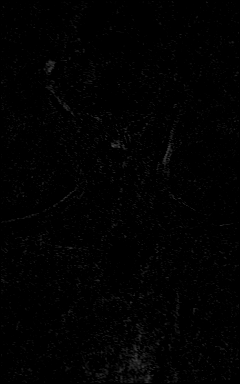

[34 of 48 positions shown; findings below may reference images not displayed]

FINDINGS: MRI HEAD FINDINGS

Brain: No restricted diffusion to suggest acute or subacute infarct.
No acute hemorrhage, mass, mass effect, or midline shift. No
hydrocephalus or extra-axial collection.

Vascular: Normal flow voids.

Skull and upper cervical spine: Normal marrow signal.

Sinuses/Orbits: Mucosal thickening in the maxillary sinuses and
ethmoid air cells. Otherwise negative. The mastoids are well
aerated.

Cranial Nerves VII and VIII: Normal bilaterally, without evidence of
mass or abnormal enhancement.

Cochleae: Normal bilaterally.

Semicircular Canals: Normal bilaterally.

Porus Acusticus: Normal bilaterally.

Cerebellopontine Angle: Normal bilaterally, without evidence of
mass.

Vestibular Aqueducts: Normal bilaterally.

MRA HEAD FINDINGS

Anterior circulation: Both internal carotid arteries are patent to
the termini, without stenosis or other abnormality. No evidence of
aberrant internal carotid artery.

A1 segments patent. Normal anterior communicating artery. Anterior
cerebral arteries are patent to their distal aspects.

No M1 stenosis or occlusion. Normal MCA bifurcations. Distal MCA
branches perfused and symmetric.

Posterior circulation: Vertebral arteries patent to the
vertebrobasilar junction without stenosis. Posterior inferior
cerebral arteries patent bilaterally.

Basilar patent to its distal aspect. Superior cerebellar arteries
patent bilaterally.

Bilateral P1 segments originate from the basilar artery. PCAs
perfused to their distal aspects without stenosis. The posterior
communicating arteries are not visualized.

Anatomic variants: In the right posterior fossa, there is a dural
arteriovenous fistula (series 3, images 70 and series 6, image 1).
Arterialized blood is noted in the distal transverse and sigmoid
sinus, with high signal on the MRA, and enlarged feeding arteries,
which appear to be branches of the right external carotid artery, in
particular the occipital artery and middle meningeal artery.

No evidence of jugular bulb diverticulum or high-riding jugular
bulb, sigmoid sinus diverticulum, or mastoid emissary veins.

MRA NECK FINDINGS

Aortic arch: Three-vessel arch.  No stenosis or dissection.

Right carotid system: No evidence of dissection, occlusion or
hemodynamically significant stenosis (greater than 50%).

Left carotid system: No evidence of dissection, occlusion or
hemodynamically significant stenosis (greater than 50%).

Vertebral arteries: No evidence of dissection, occlusion or
hemodynamically significant stenosis (greater than 50%).

Other: None
IMPRESSION: 1. Dural arteriovenous fistula in the right posterior fossa, being
fed by branches of the right external carotid artery, in particular
the occipital artery and middle meningeal artery, with arterialized
flow seen in the right distal transverse and sigmoid sinus.
Neuro-Interventional Radiology consultation is recommended.
2. No other acute intracranial process.
3.  No intracranial large vessel occlusion or significant stenosis.
4.  No hemodynamically significant stenosis in the neck.

These results will be called to the ordering clinician or
representative by the Radiologist Assistant, and communication
documented in the PACS or [REDACTED].

## 2021-09-10 IMAGING — MR MR HEAD WO/W CM
12 of 13 series · 37 of 48 positions shown · IV contrast (multihance)
Comparison: None

CLINICAL DATA: Pulsatile tinnitus of right ear

EXAM:
MRI HEAD WITHOUT AND WITH CONTRAST
MRA HEAD WITHOUT CONTRAST
MRA NECK WITHOUT AND WITH CONTRAST
TECHNIQUE: Multiplanar, multi-echo pulse sequences of the brain and surrounding
structures were acquired without and with intravenous contrast.
Angiographic images of the Circle of Willis were acquired using MRA
technique without intravenous contrast. Angiographic images of the
neck were acquired using MRA technique without and with intravenous
contrast. Carotid stenosis measurements (when applicable) are
obtained utilizing NASCET criteria, using the distal internal
carotid diameter as the denominator.
CONTRAST:  9mL MULTIHANCE GADOBENATE DIMEGLUMINE 529 MG/ML IV SOLN

[Series 2: T1 · sagittal · 5.0mm · 0.45mm/px · 3 of 23 slices shown (1 of 3)]
[im 1/23]
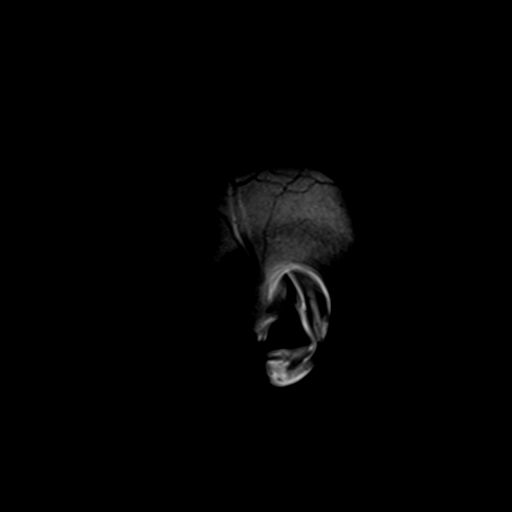
[im 12/23]
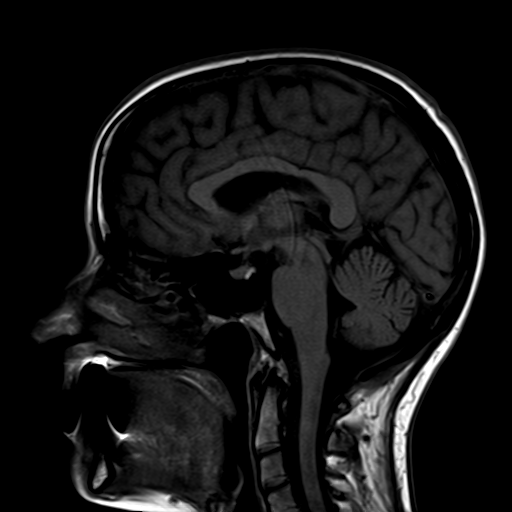
[im 23/23]
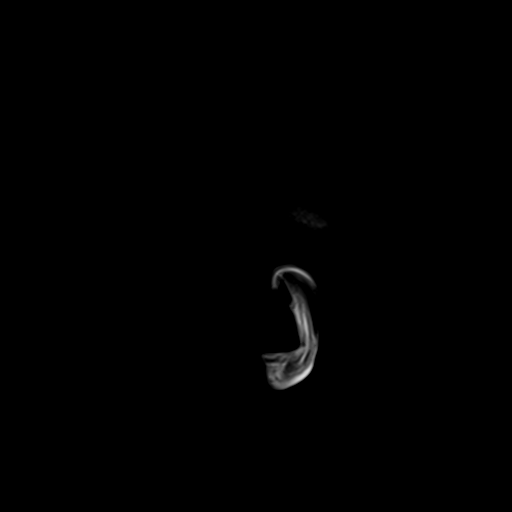

[Series 3: DWI · axial · 3.0mm · 1.80mm/px · z∈[-68,+84]mm · 11 of 104 slices shown (1 of 2)]
[im 1/104]
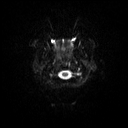
[im 11/104]
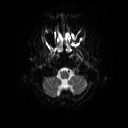
[im 21/104]
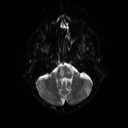
[im 31/104]
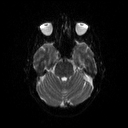
[im 42/104]
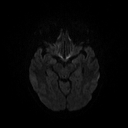
[im 52/104]
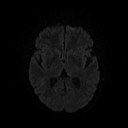
[im 62/104]
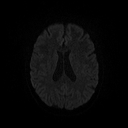
[im 73/104]
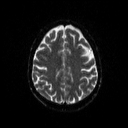
[im 83/104]
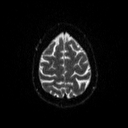
[im 93/104]
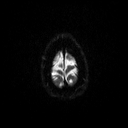
[im 104/104]
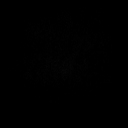

[Series 4: DWI · axial · 3.0mm · 1.80mm/px · z∈[-68,+84]mm · 5 of 52 slices shown (2 of 2)]
[im 1/52]
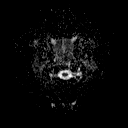
[im 13/52]
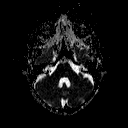
[im 26/52]
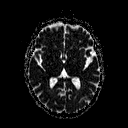
[im 39/52]
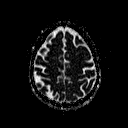
[im 52/52]
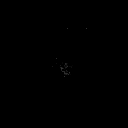

[Series 5: T2 · coronal · 5.0mm · 0.45mm/px · 3 of 28 slices shown (1 of 2)]
[im 1/28]
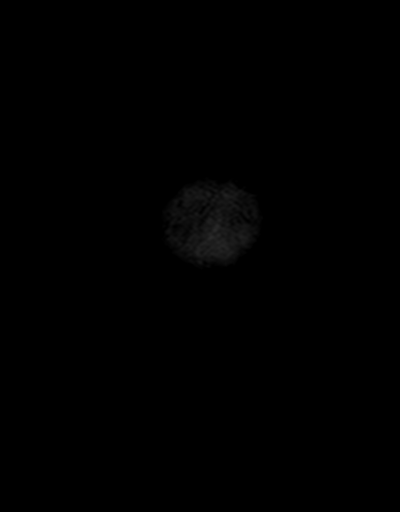
[im 14/28]
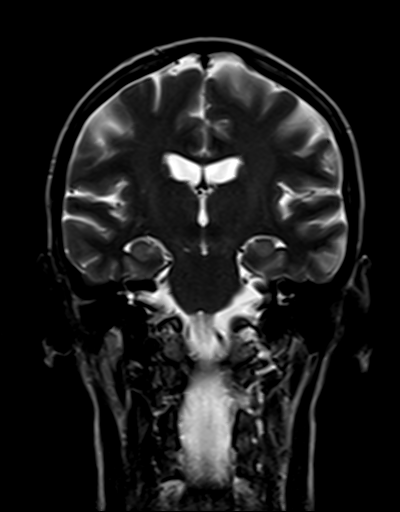
[im 28/28]
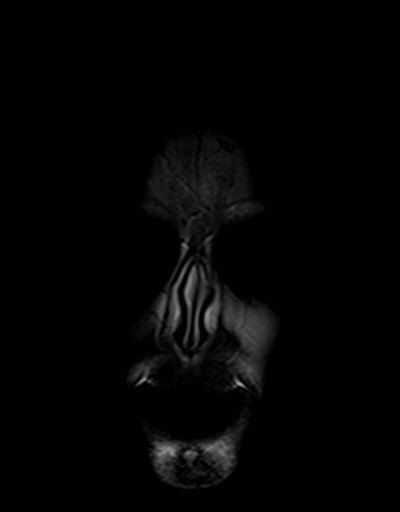

[Series 6: T2 · axial · 5.0mm · 0.60mm/px · z∈[-69,+86]mm · 2 of 25 slices shown (2 of 2)]
[im 1/25]
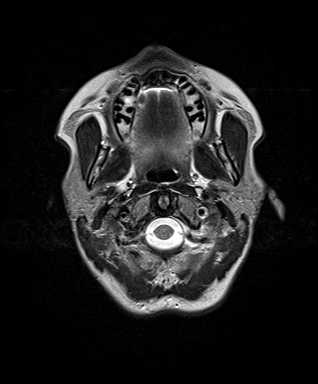
[im 25/25]
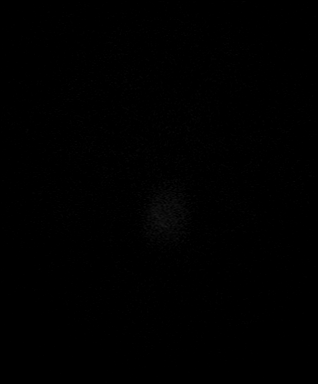

[Series 7: FLAIR · axial · 3.0mm · 0.45mm/px · z∈[-70,+85]mm · 3 of 27 slices shown]
[im 1/27]
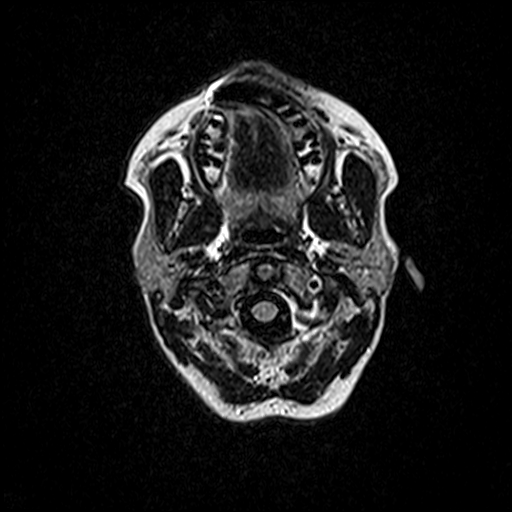
[im 14/27]
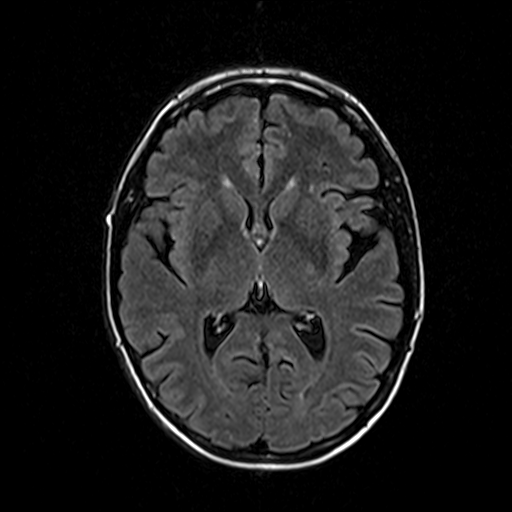
[im 27/27]
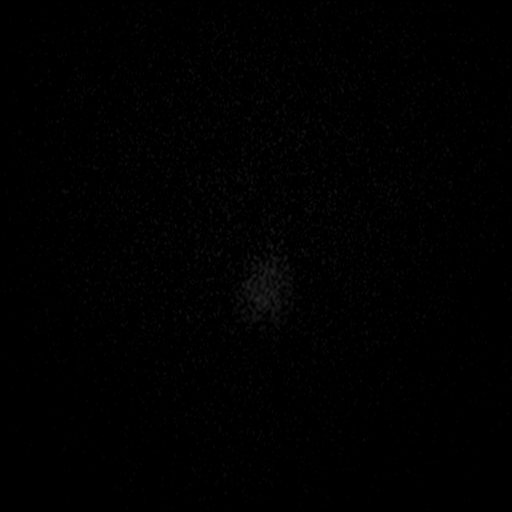

[Series 9: swi_images · axial · 3.0mm · 0.90mm/px · z∈[-68,+84]mm · 5 of 52 slices shown]
[im 1/52]
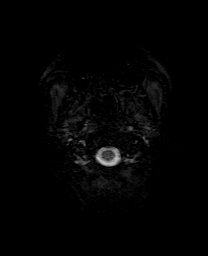
[im 13/52]
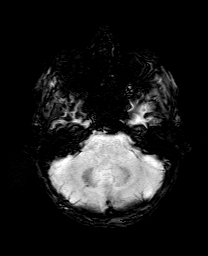
[im 26/52]
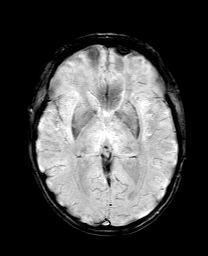
[im 39/52]
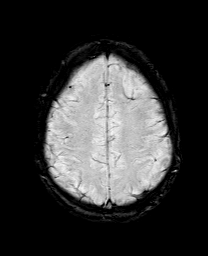
[im 52/52]
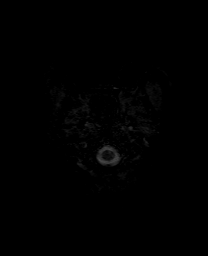

[Series 10: T1 · coronal · 3.0mm · 0.35mm/px · 1 of 14 slices shown (2 of 3)]
[im 1/14]
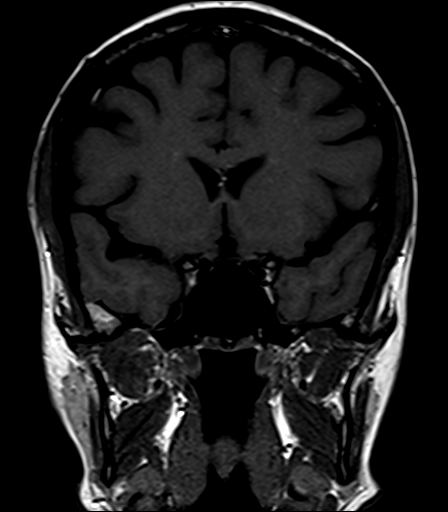

[Series 11: T1 · axial · 3.0mm · 0.35mm/px · 1 of 14 slices shown (3 of 3)]
[im 1/14]
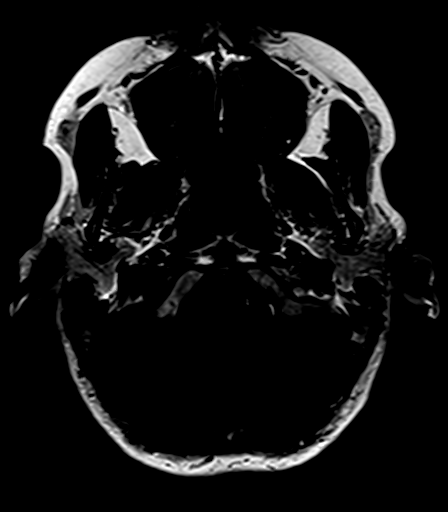

[Series 12: bSSFP · axial · 1.0mm · 0.28mm/px · 1 of 40 slices shown]
[im 1/40]
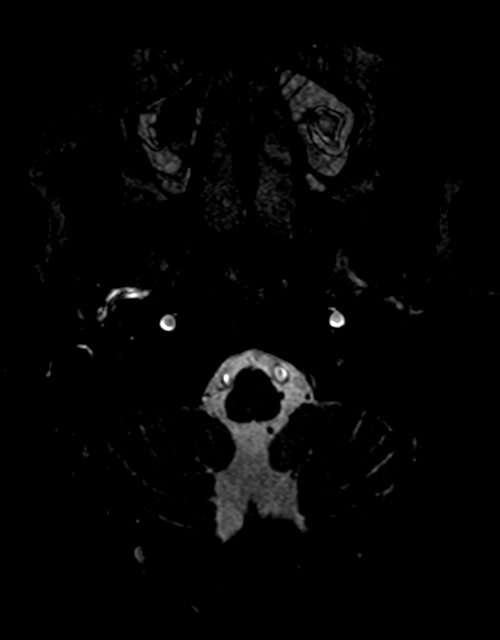

[Series 13: T1 post-contrast · coronal · 3.0mm · 0.35mm/px · 1 of 14 slices shown (1 of 2)]
[im 1/14]
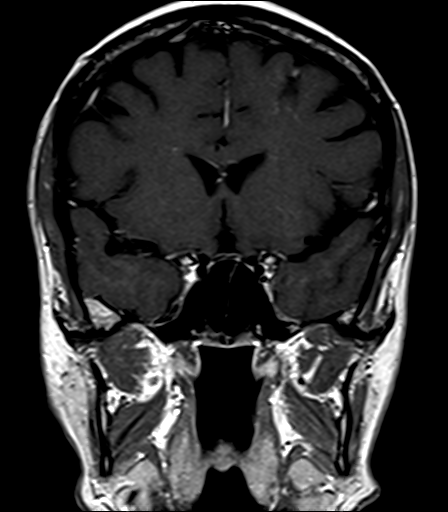

[Series 14: T1 post-contrast · axial · 3.0mm · 0.35mm/px · 1 of 14 slices shown (2 of 2)]
[im 1/14]
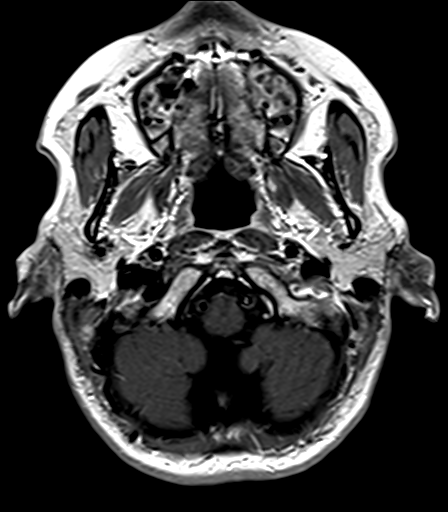

[37 of 48 positions shown; findings below may reference images not displayed]

FINDINGS: MRI HEAD FINDINGS

Brain: No restricted diffusion to suggest acute or subacute infarct.
No acute hemorrhage, mass, mass effect, or midline shift. No
hydrocephalus or extra-axial collection.

Vascular: Normal flow voids.

Skull and upper cervical spine: Normal marrow signal.

Sinuses/Orbits: Mucosal thickening in the maxillary sinuses and
ethmoid air cells. Otherwise negative. The mastoids are well
aerated.

Cranial Nerves VII and VIII: Normal bilaterally, without evidence of
mass or abnormal enhancement.

Cochleae: Normal bilaterally.

Semicircular Canals: Normal bilaterally.

Porus Acusticus: Normal bilaterally.

Cerebellopontine Angle: Normal bilaterally, without evidence of
mass.

Vestibular Aqueducts: Normal bilaterally.

MRA HEAD FINDINGS

Anterior circulation: Both internal carotid arteries are patent to
the termini, without stenosis or other abnormality. No evidence of
aberrant internal carotid artery.

A1 segments patent. Normal anterior communicating artery. Anterior
cerebral arteries are patent to their distal aspects.

No M1 stenosis or occlusion. Normal MCA bifurcations. Distal MCA
branches perfused and symmetric.

Posterior circulation: Vertebral arteries patent to the
vertebrobasilar junction without stenosis. Posterior inferior
cerebral arteries patent bilaterally.

Basilar patent to its distal aspect. Superior cerebellar arteries
patent bilaterally.

Bilateral P1 segments originate from the basilar artery. PCAs
perfused to their distal aspects without stenosis. The posterior
communicating arteries are not visualized.

Anatomic variants: In the right posterior fossa, there is a dural
arteriovenous fistula (series 3, images 70 and series 6, image 1).
Arterialized blood is noted in the distal transverse and sigmoid
sinus, with high signal on the MRA, and enlarged feeding arteries,
which appear to be branches of the right external carotid artery, in
particular the occipital artery and middle meningeal artery.

No evidence of jugular bulb diverticulum or high-riding jugular
bulb, sigmoid sinus diverticulum, or mastoid emissary veins.

MRA NECK FINDINGS

Aortic arch: Three-vessel arch.  No stenosis or dissection.

Right carotid system: No evidence of dissection, occlusion or
hemodynamically significant stenosis (greater than 50%).

Left carotid system: No evidence of dissection, occlusion or
hemodynamically significant stenosis (greater than 50%).

Vertebral arteries: No evidence of dissection, occlusion or
hemodynamically significant stenosis (greater than 50%).

Other: None
IMPRESSION: 1. Dural arteriovenous fistula in the right posterior fossa, being
fed by branches of the right external carotid artery, in particular
the occipital artery and middle meningeal artery, with arterialized
flow seen in the right distal transverse and sigmoid sinus.
Neuro-Interventional Radiology consultation is recommended.
2. No other acute intracranial process.
3.  No intracranial large vessel occlusion or significant stenosis.
4.  No hemodynamically significant stenosis in the neck.

These results will be called to the ordering clinician or
representative by the Radiologist Assistant, and communication
documented in the PACS or [REDACTED].

## 2021-09-10 IMAGING — MR MR MRA HEAD W/O CM
1 series · 20 of 48 positions shown · IV contrast (multihance)
Comparison: None

CLINICAL DATA: Pulsatile tinnitus of right ear

EXAM:
MRI HEAD WITHOUT AND WITH CONTRAST
MRA HEAD WITHOUT CONTRAST
MRA NECK WITHOUT AND WITH CONTRAST
TECHNIQUE: Multiplanar, multi-echo pulse sequences of the brain and surrounding
structures were acquired without and with intravenous contrast.
Angiographic images of the Circle of Willis were acquired using MRA
technique without intravenous contrast. Angiographic images of the
neck were acquired using MRA technique without and with intravenous
contrast. Carotid stenosis measurements (when applicable) are
obtained utilizing NASCET criteria, using the distal internal
carotid diameter as the denominator.
CONTRAST:  9mL MULTIHANCE GADOBENATE DIMEGLUMINE 529 MG/ML IV SOLN

[Series 3: [id number] · axial · 0.7mm · 0.35mm/px · z∈[-71,+33]mm · 20 of 156 slices shown]
[im 1/156]
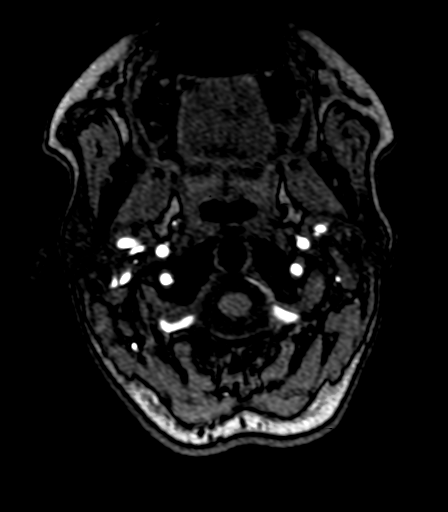
[im 4/156]
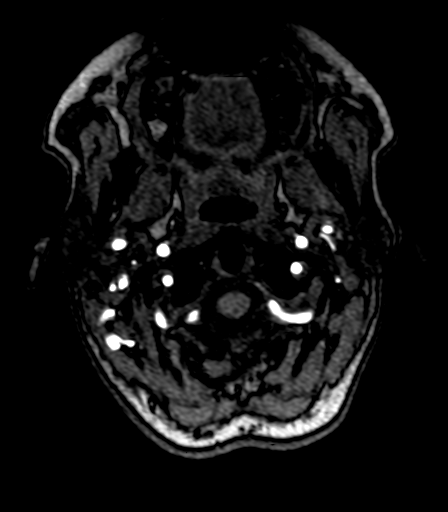
[im 7/156]
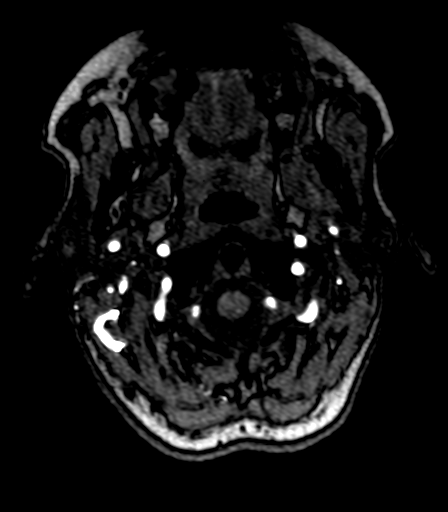
[im 10/156]
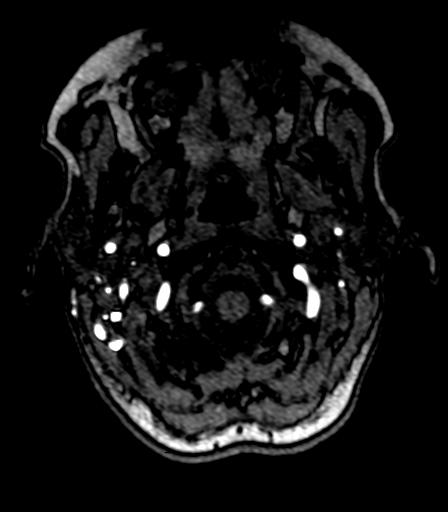
[im 14/156]
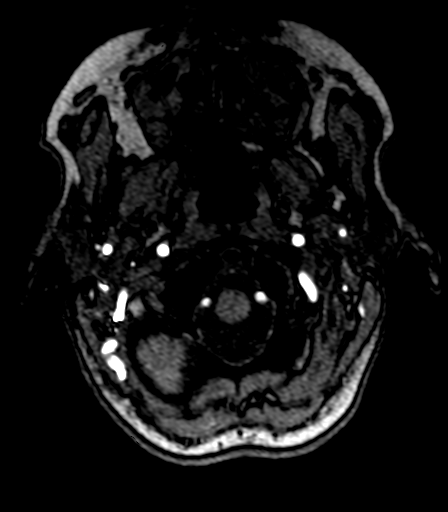
[im 17/156]
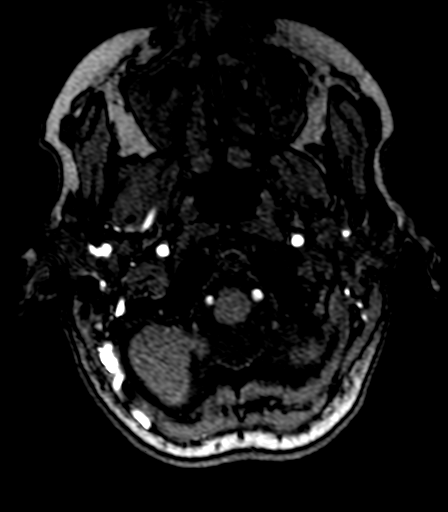
[im 20/156]
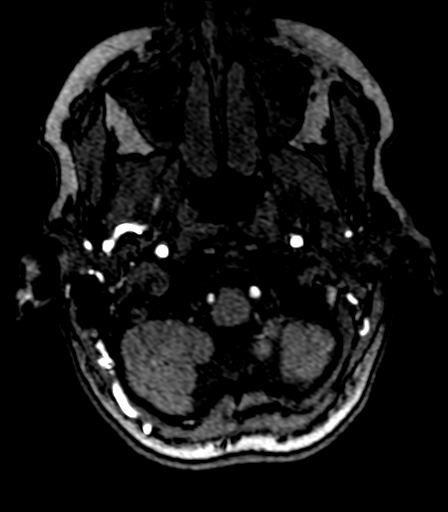
[im 24/156]
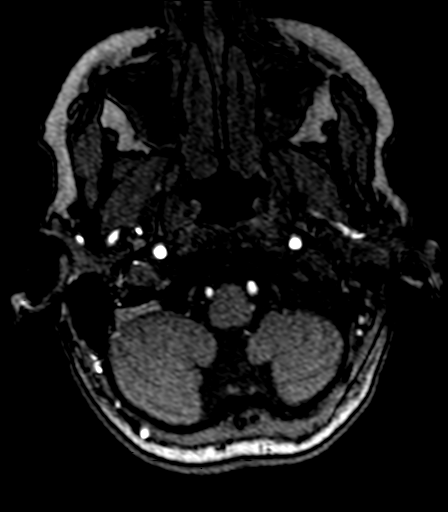
[im 27/156]
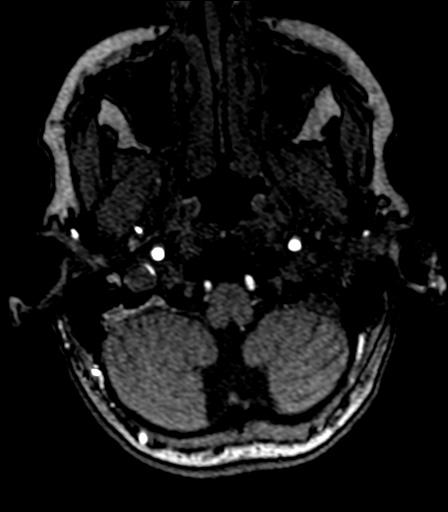
[im 30/156]
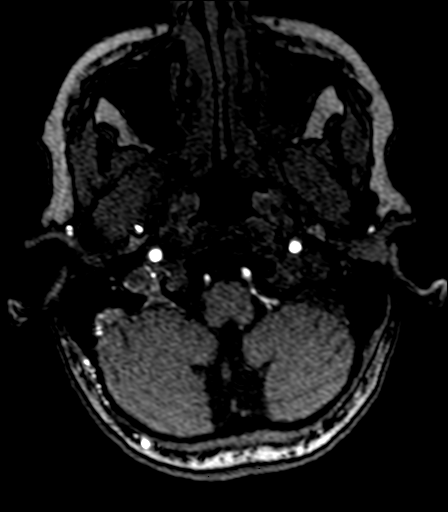
[im 33/156]
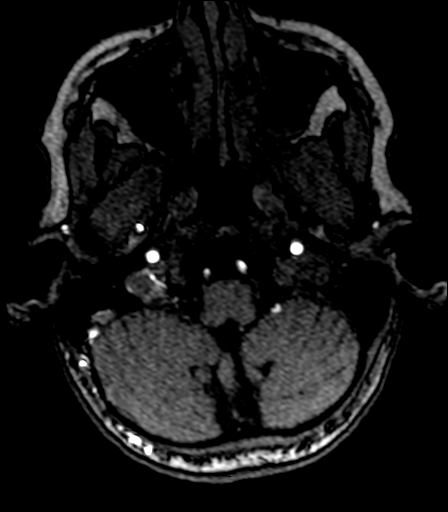
[im 37/156]
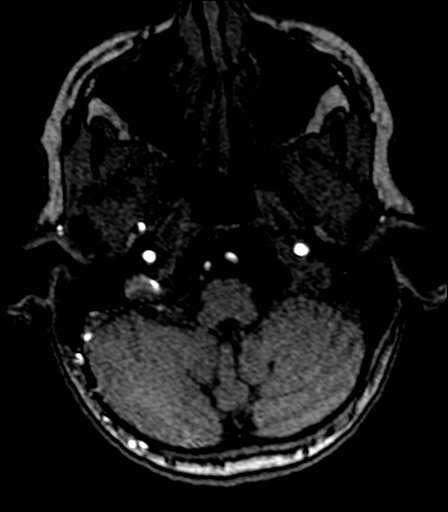
[im 50/156]
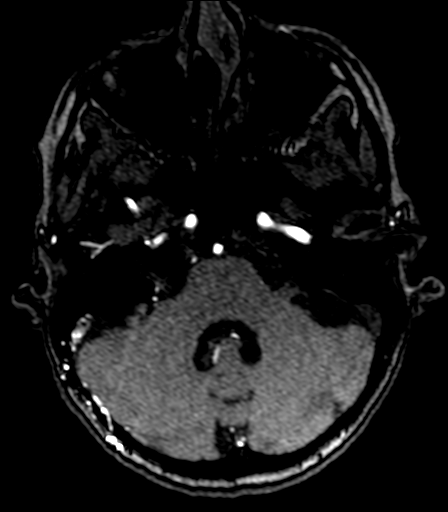
[im 70/156]
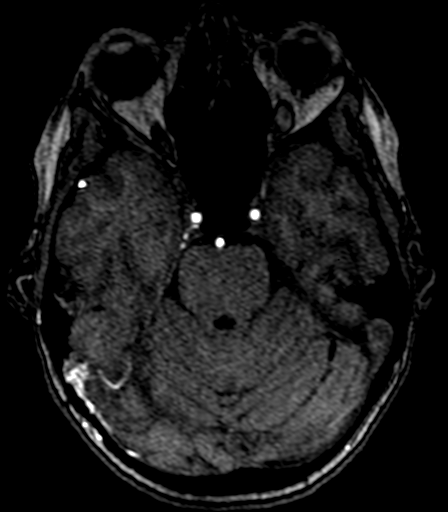
[im 80/156]
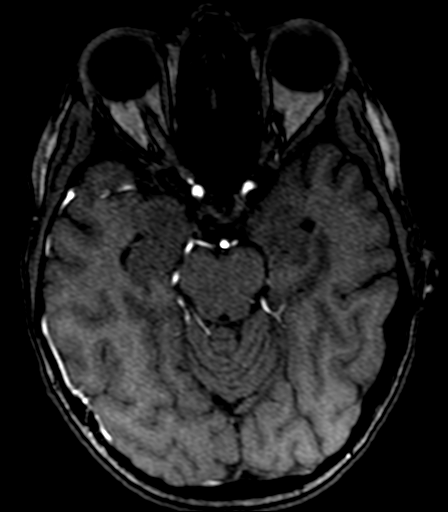
[im 90/156]
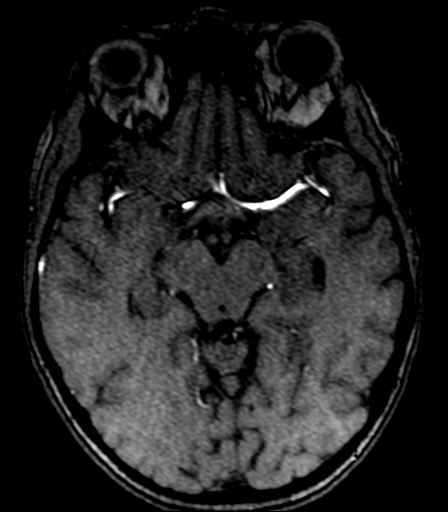
[im 109/156]
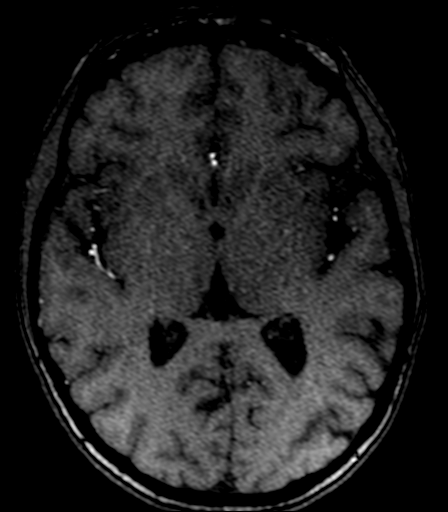
[im 129/156]
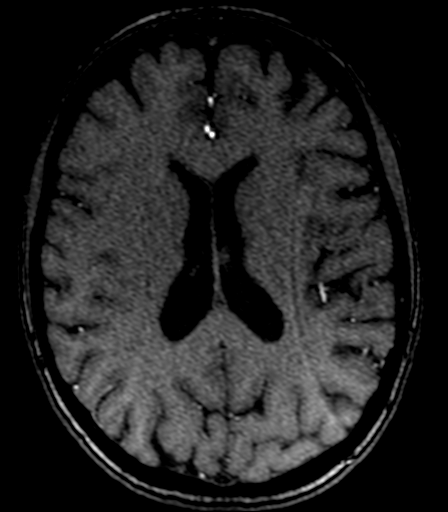
[im 132/156]
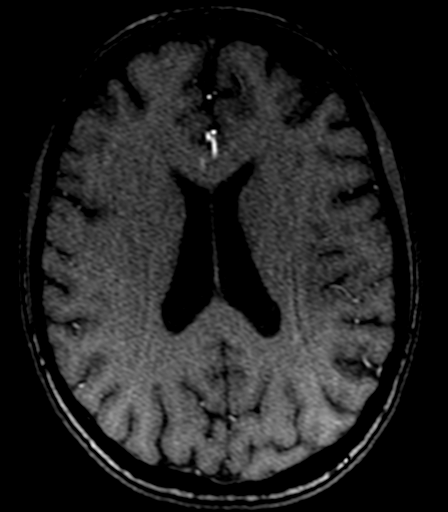
[im 149/156]
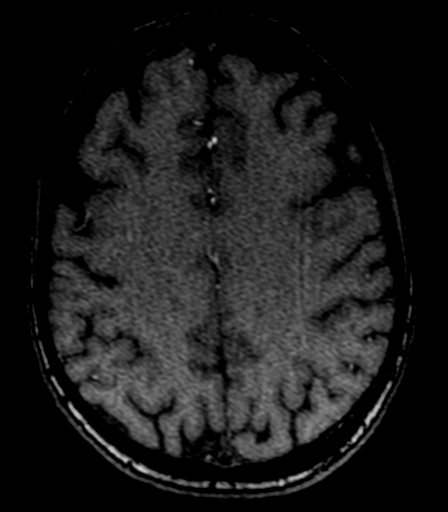

[20 of 48 positions shown; findings below may reference images not displayed]

FINDINGS: MRI HEAD FINDINGS

Brain: No restricted diffusion to suggest acute or subacute infarct.
No acute hemorrhage, mass, mass effect, or midline shift. No
hydrocephalus or extra-axial collection.

Vascular: Normal flow voids.

Skull and upper cervical spine: Normal marrow signal.

Sinuses/Orbits: Mucosal thickening in the maxillary sinuses and
ethmoid air cells. Otherwise negative. The mastoids are well
aerated.

Cranial Nerves VII and VIII: Normal bilaterally, without evidence of
mass or abnormal enhancement.

Cochleae: Normal bilaterally.

Semicircular Canals: Normal bilaterally.

Porus Acusticus: Normal bilaterally.

Cerebellopontine Angle: Normal bilaterally, without evidence of
mass.

Vestibular Aqueducts: Normal bilaterally.

MRA HEAD FINDINGS

Anterior circulation: Both internal carotid arteries are patent to
the termini, without stenosis or other abnormality. No evidence of
aberrant internal carotid artery.

A1 segments patent. Normal anterior communicating artery. Anterior
cerebral arteries are patent to their distal aspects.

No M1 stenosis or occlusion. Normal MCA bifurcations. Distal MCA
branches perfused and symmetric.

Posterior circulation: Vertebral arteries patent to the
vertebrobasilar junction without stenosis. Posterior inferior
cerebral arteries patent bilaterally.

Basilar patent to its distal aspect. Superior cerebellar arteries
patent bilaterally.

Bilateral P1 segments originate from the basilar artery. PCAs
perfused to their distal aspects without stenosis. The posterior
communicating arteries are not visualized.

Anatomic variants: In the right posterior fossa, there is a dural
arteriovenous fistula (series 3, images 70 and series 6, image 1).
Arterialized blood is noted in the distal transverse and sigmoid
sinus, with high signal on the MRA, and enlarged feeding arteries,
which appear to be branches of the right external carotid artery, in
particular the occipital artery and middle meningeal artery.

No evidence of jugular bulb diverticulum or high-riding jugular
bulb, sigmoid sinus diverticulum, or mastoid emissary veins.

MRA NECK FINDINGS

Aortic arch: Three-vessel arch.  No stenosis or dissection.

Right carotid system: No evidence of dissection, occlusion or
hemodynamically significant stenosis (greater than 50%).

Left carotid system: No evidence of dissection, occlusion or
hemodynamically significant stenosis (greater than 50%).

Vertebral arteries: No evidence of dissection, occlusion or
hemodynamically significant stenosis (greater than 50%).

Other: None
IMPRESSION: 1. Dural arteriovenous fistula in the right posterior fossa, being
fed by branches of the right external carotid artery, in particular
the occipital artery and middle meningeal artery, with arterialized
flow seen in the right distal transverse and sigmoid sinus.
Neuro-Interventional Radiology consultation is recommended.
2. No other acute intracranial process.
3.  No intracranial large vessel occlusion or significant stenosis.
4.  No hemodynamically significant stenosis in the neck.

These results will be called to the ordering clinician or
representative by the Radiologist Assistant, and communication
documented in the PACS or [REDACTED].

## 2021-09-10 IMAGING — US US CAROTID DUPLEX BILAT
1 series · 14 of 24 positions shown · non-contrast
Comparison: None.

CLINICAL DATA: Pulsatile tinnitus

EXAM:
BILATERAL CAROTID DUPLEX ULTRASOUND
TECHNIQUE: Gray scale imaging, color Doppler and duplex ultrasound were
performed of bilateral carotid and vertebral arteries in the neck.

[Series 1: us carotid duplex bilat · 0.05mm/px · 14 of 54 slices shown]
[im 1/54]
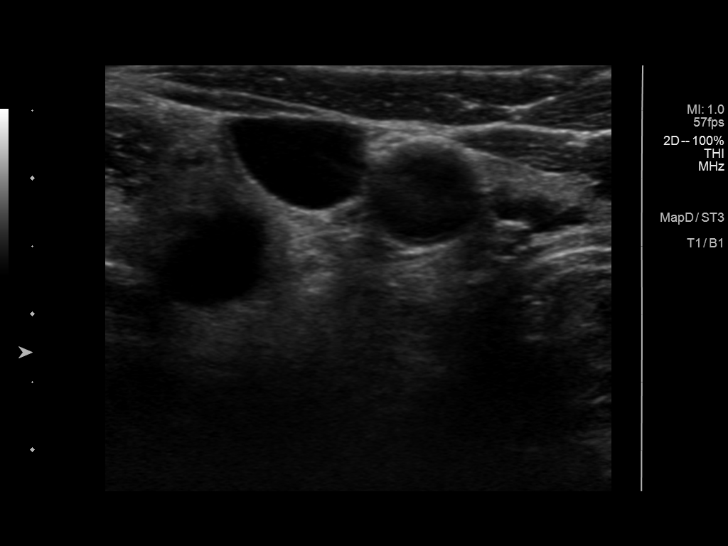
[im 5/54]
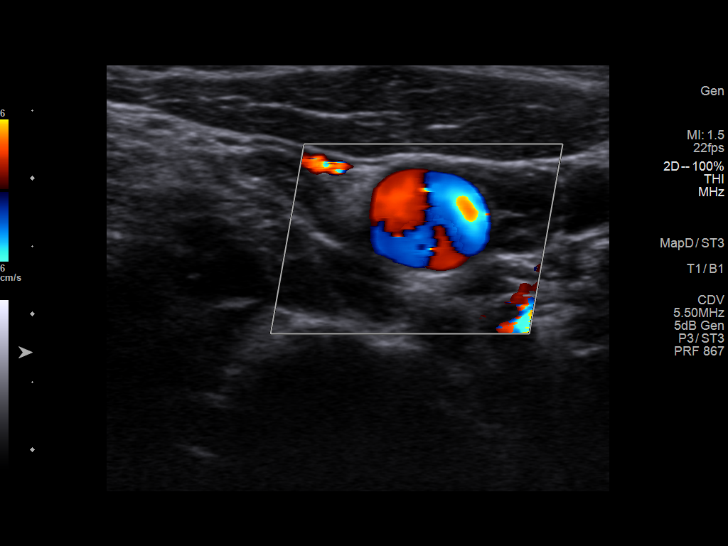
[im 10/54]
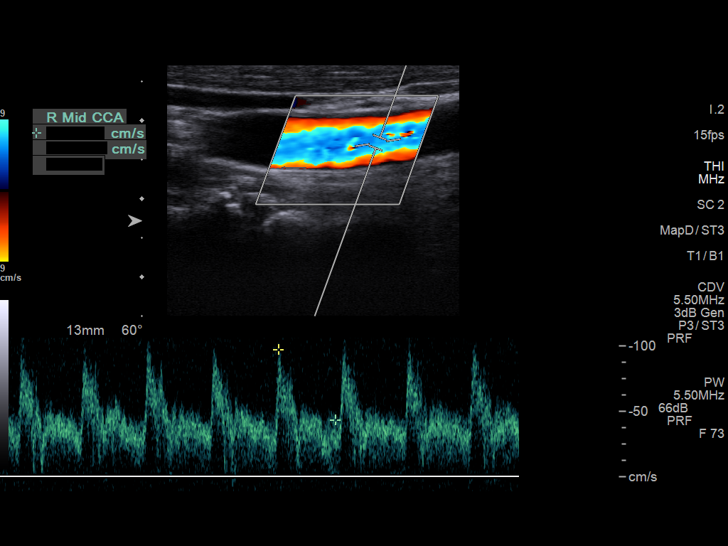
[im 14/54]
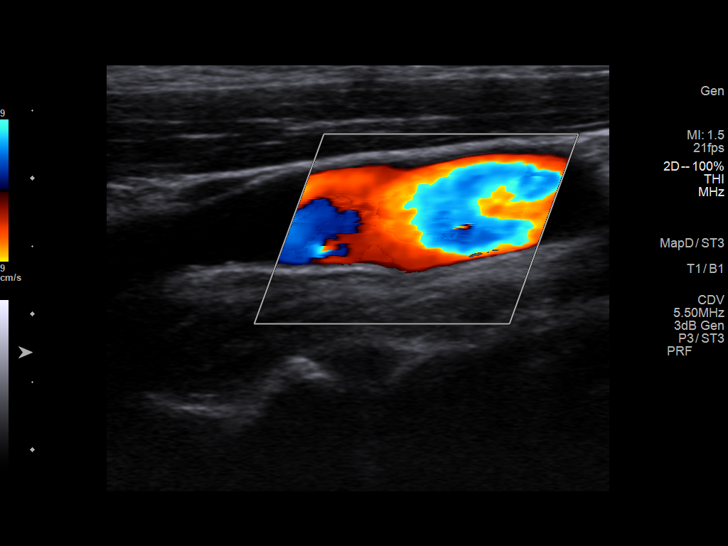
[im 17/54]
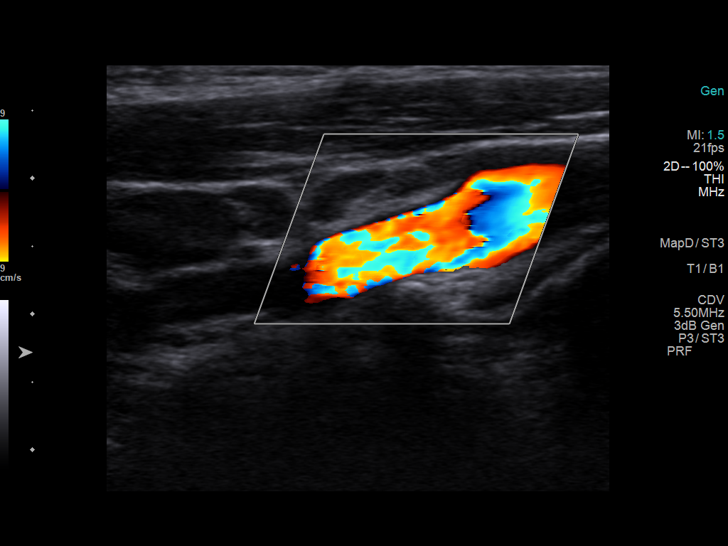
[im 21/54]
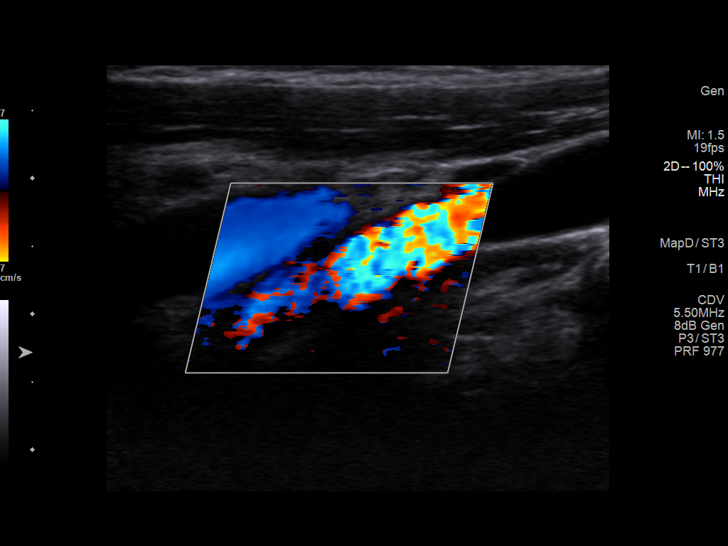
[im 26/54]
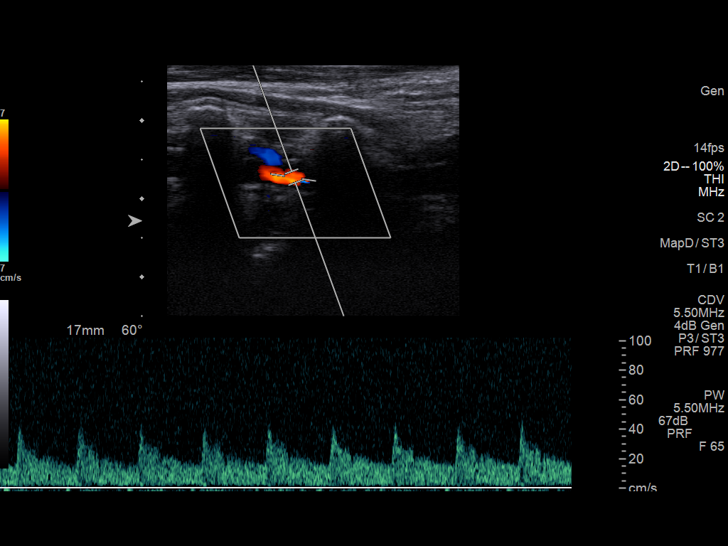
[im 28/54]
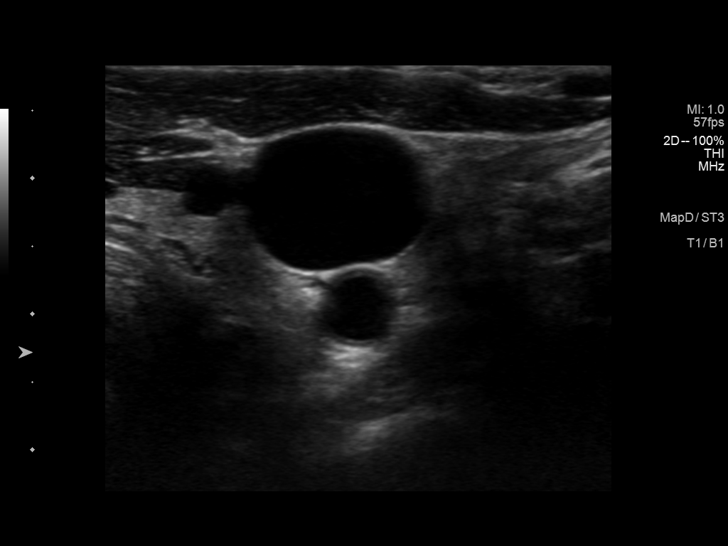
[im 33/54]
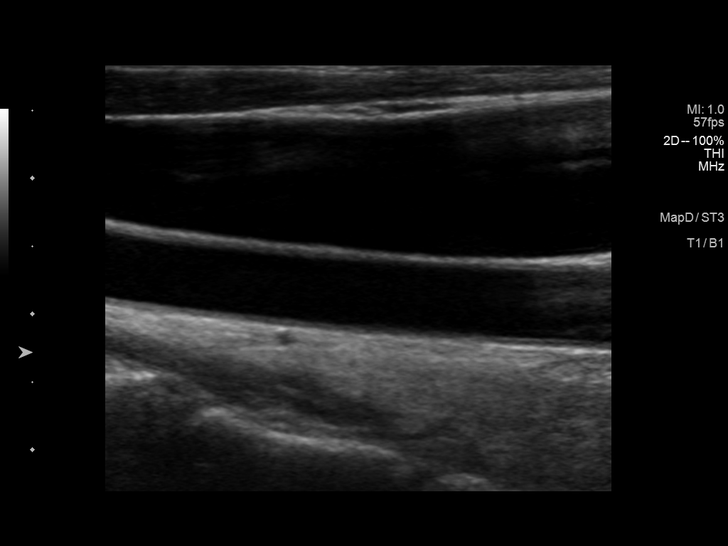
[im 37/54]
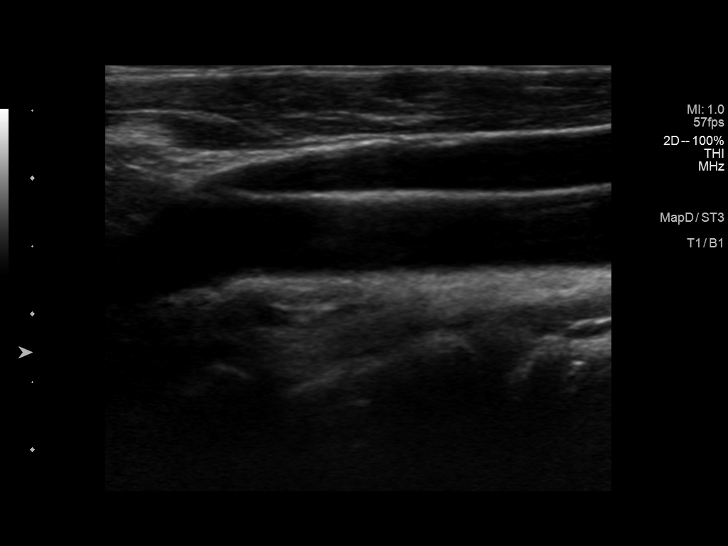
[im 42/54]
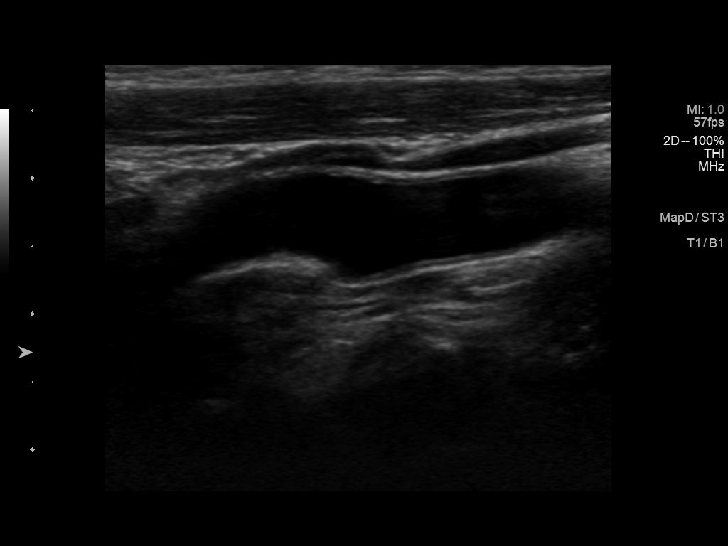
[im 44/54]
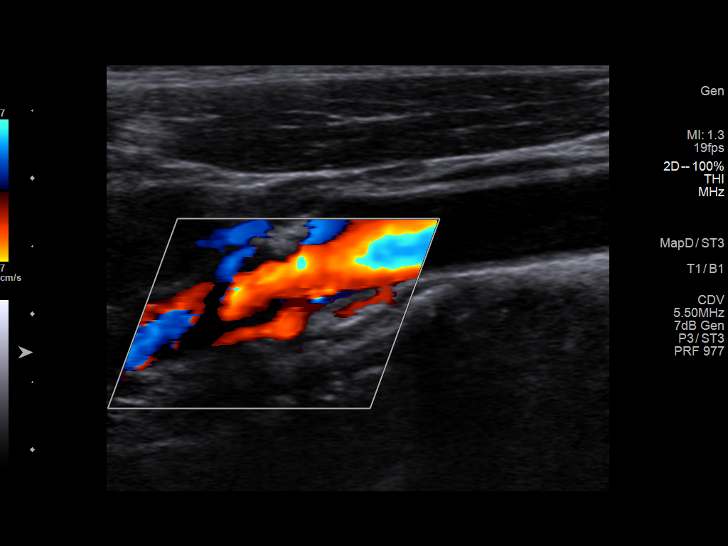
[im 49/54]
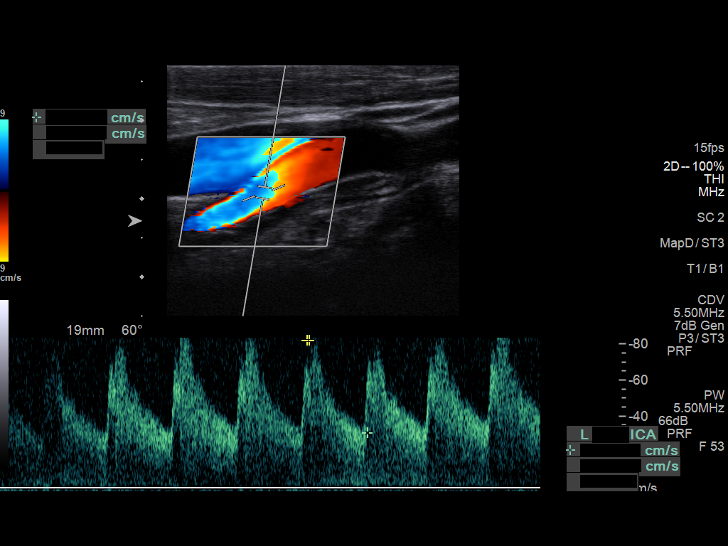
[im 54/54]
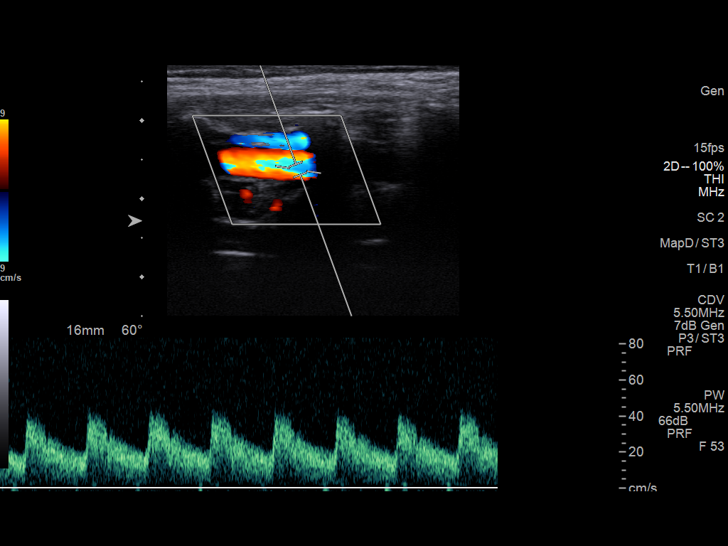

[14 of 24 positions shown; findings below may reference images not displayed]

FINDINGS: Criteria: Quantification of carotid stenosis is based on velocity
parameters that correlate the residual internal carotid diameter
with NASCET-based stenosis levels, using the diameter of the distal
internal carotid lumen as the denominator for stenosis measurement.

The following velocity measurements were obtained:

RIGHT

ICA: 75/31 cm/sec

CCA: 97/43 cm/sec

SYSTOLIC ICA/CCA RATIO:

ECA: 109 cm/sec

LEFT

ICA: 82/30 cm/sec

CCA: 70/14 cm/sec

SYSTOLIC ICA/CCA RATIO:

ECA: 44 cm/sec

RIGHT CAROTID ARTERY: No significant atheromatous plaque.

RIGHT VERTEBRAL ARTERY:  Antegrade flow.

LEFT CAROTID ARTERY:  No significant atheromatous plaque.

LEFT VERTEBRAL ARTERY:  Antegrade flow.
IMPRESSION: No significant stenosis of internal carotid arteries.

## 2021-09-10 MED ORDER — GADOBENATE DIMEGLUMINE 529 MG/ML IV SOLN
9.0000 mL | Freq: Once | INTRAVENOUS | Status: AC | PRN
  Administered 2021-09-10: 9 mL via INTRAVENOUS

## 2021-09-15 ENCOUNTER — Telehealth (HOSPITAL_COMMUNITY): Payer: Self-pay

## 2021-09-15 ENCOUNTER — Other Ambulatory Visit (HOSPITAL_COMMUNITY): Payer: Self-pay | Admitting: Neuroradiology

## 2021-09-15 DIAGNOSIS — I770 Arteriovenous fistula, acquired: Secondary | ICD-10-CM

## 2021-09-15 DIAGNOSIS — H93A1 Pulsatile tinnitus, right ear: Secondary | ICD-10-CM

## 2021-09-15 NOTE — Telephone Encounter (Signed)
Called to schedule a consult with Dr. Debbrah Alar, no answer, no vm. AW

## 2021-09-17 ENCOUNTER — Ambulatory Visit (HOSPITAL_COMMUNITY)
Admission: RE | Admit: 2021-09-17 | Discharge: 2021-09-17 | Disposition: A | Discharge status: Home / Self Care | Payer: BC Managed Care – PPO | Source: Ambulatory Visit | Attending: Neuroradiology | Admitting: Neuroradiology

## 2021-09-17 ENCOUNTER — Other Ambulatory Visit: Payer: Self-pay

## 2021-09-17 DIAGNOSIS — H93A1 Pulsatile tinnitus, right ear: Secondary | ICD-10-CM

## 2021-09-17 DIAGNOSIS — I770 Arteriovenous fistula, acquired: Secondary | ICD-10-CM

## 2021-09-21 NOTE — Consult Note (Signed)
Chief Complaint: Patient was seen in consultation today for pulsatile tinnitus.  Referring Physician(s): Pietro Cassis, PA-C.  Supervising Physician: Pedro Earls  Patient Status: [facility] - Out-pt  History of Present Illness: Cassandra Jefferson is a 41 y.o. female with a past medical history of  chronic headaches, IBS and thyroid disease presenting with right-sided pulsatile tinnitus.  She first noticed the tinnitus approximately 6-7 months ago without any clear inciting event.  This has significantly interfered with her quality of life, particularly her sleep which is disturbed by the constant whooshing sound.  She says that the only thing that alleviates the whooshing sound is holding pressure on her right common carotid in the lower neck, otherwise, whooshing is continuous.  She underwent audiological evaluation which was within normal limits.  She does not recall any significant head trauma in the past or any episode of significant headache or neurological symptoms that could indicate a prior venous thrombosis.  Upon review of her chart, I see her mother has a prior history of thrombosis with MTHFR mutation.  She does not personally have a history of thrombosis.  She underwent ENT evaluation when she had an MRI and MRA of the brain ordered which was positive for right transverse/sigmoid sinus dural AV fistula with no clear evidence of cortical venous reflux.  This finding certainly explained her pulsatile tinnitus.    Past Medical History:  Diagnosis Date   Chronic headaches    IBS (irritable bowel syndrome)    Thyroid disease     Past Surgical History:  Procedure Laterality Date   DIAGNOSTIC LAPAROSCOPY     wisom teeth      Allergies: Erythromycin and Penicillins  Medications: Prior to Admission medications   Medication Sig Start Date End Date Taking? Authorizing Provider  albuterol (PROVENTIL) (2.5 MG/3ML) 0.083% nebulizer solution Take 2.5 mg by nebulization  every 6 (six) hours as needed for wheezing or shortness of breath. For asthma    [provider]  Calcium Citrate-Vitamin D (CITRACAL PETITES/VITAMIN D PO) Take 2 tablets by mouth 2 (two) times daily.      [provider]  cephALEXin (KEFLEX) 500 MG capsule Take 1 capsule (500 mg total) by mouth 3 (three) times daily. 07/30/16   Posey Boyer, MD  cyproheptadine (PERIACTIN) 4 MG tablet Take 4 mg by mouth as needed. For allergies 09/29/15   [provider]  levonorgestrel (MIRENA) 20 MCG/24HR IUD 1 each by Intrauterine route once.      [provider]  Multiple Vitamin (MULTIVITAMIN) tablet Take 1 tablet by mouth daily.      [provider]  naproxen sodium (ANAPROX DS) 550 MG tablet Take 1 tablet (550 mg total) by mouth 2 (two) times daily with a meal. 07/25/16   Jaynee Eagles, PA-C  pantoprazole (PROTONIX) 40 MG tablet Take 1 tablet (40 mg total) by mouth daily. 03/07/11 03/06/12  Milus Banister, MD  Probiotic Product (PROBIOTIC PO) Take by mouth. Ultra Flora Plus DF.  Takes 1 capsule daily     [provider]  QNASL 80 MCG/ACT AERS Place 80 mcg into the nose as needed. For allergies 09/03/15   [provider]  Thyroid (NATURE-THROID PO) Take 1 tablet by mouth daily. 1 and 3/4's grains daily    [provider]  valACYclovir (VALTREX) 1000 MG tablet Take 1,000 mg by mouth as needed.      [provider]     Family History  Problem Relation Age of Onset  Clotting disorder Mother    Irritable bowel syndrome Mother    Diabetes Sister    Irritable bowel syndrome Sister    Prostate cancer Maternal Uncle    Heart disease Maternal Uncle    Diabetes Maternal Grandmother    Heart disease Maternal Grandmother    Stomach cancer Maternal Grandfather    Colon cancer Maternal Grandfather    Heart disease Maternal Grandfather    Ovarian cancer Paternal Grandmother    Melanoma Father     Social History   Socioeconomic  History   Marital status: Married    Spouse name: Not on file   Number of children: 0   Years of education: Not on file   Highest education level: Not on file  Occupational History   Occupation: Marine scientist: [employer]  Tobacco Use   Smoking status: Never   Smokeless tobacco: Never  Substance and Sexual Activity   Alcohol use: Yes    Comment: occ   Drug use: No   Sexual activity: Not on file  Other Topics Concern   Not on file  Social History Narrative   Not on file   Social Determinants of Health   Financial Resource Strain: Not on file  Food Insecurity: Not on file  Transportation Needs: Not on file  Physical Activity: Not on file  Stress: Not on file  Social Connections: Not on file     Review of Systems: A 12 point ROS discussed and pertinent positives are indicated in the HPI above.  All other systems are negative.  Review of Systems  Vital Signs: There were no vitals taken for this visit.  Physical Exam Constitutional:      General: She is not in acute distress.    Appearance: Normal appearance.  HENT:     Head: Normocephalic and atraumatic.  Eyes:     General: Vision grossly intact.     Extraocular Movements: Extraocular movements intact.     Pupils: Pupils are equal, round, and reactive to light.  Neurological:     Mental Status: She is alert and oriented to person, place, and time.     Cranial Nerves: Cranial nerves 2-12 are intact.     Sensory: Sensation is intact.     Motor: Motor function is intact.     Gait: Gait is intact.         Imaging: MR ANGIO HEAD WO CONTRAST  Result Date: 09/10/2021 CLINICAL DATA:  Pulsatile tinnitus of right ear EXAM: MRI HEAD WITHOUT AND WITH CONTRAST MRA HEAD WITHOUT CONTRAST MRA NECK WITHOUT AND WITH CONTRAST TECHNIQUE: Multiplanar, multi-echo pulse sequences of the brain and surrounding structures were acquired without and with intravenous contrast. Angiographic images of the Circle of  [place] were acquired using MRA technique without intravenous contrast. Angiographic images of the neck were acquired using MRA technique without and with intravenous contrast. Carotid stenosis measurements (when applicable) are obtained utilizing NASCET criteria, using the distal internal carotid diameter as the denominator. CONTRAST:  48mL MULTIHANCE GADOBENATE DIMEGLUMINE 529 MG/ML IV SOLN COMPARISON:  None FINDINGS: MRI HEAD FINDINGS Brain: No restricted diffusion to suggest acute or subacute infarct. No acute hemorrhage, mass, mass effect, or midline shift. No hydrocephalus or extra-axial collection. Vascular: Normal flow voids. Skull and upper cervical spine: Normal marrow signal. Sinuses/Orbits: Mucosal thickening in the maxillary sinuses and ethmoid air cells. Otherwise negative. The mastoids are well aerated. Cranial Nerves VII and VIII: Normal bilaterally, without evidence of mass or abnormal enhancement.  Cochleae: Normal bilaterally. Semicircular Canals: Normal bilaterally. Porus Acusticus: Normal bilaterally. Cerebellopontine Angle: Normal bilaterally, without evidence of mass. Vestibular Aqueducts: Normal bilaterally. MRA HEAD FINDINGS Anterior circulation: Both internal carotid arteries are patent to the termini, without stenosis or other abnormality. No evidence of aberrant internal carotid artery. A1 segments patent. Normal anterior communicating artery. Anterior cerebral arteries are patent to their distal aspects. No M1 stenosis or occlusion. Normal MCA bifurcations. Distal MCA branches perfused and symmetric. Posterior circulation: Vertebral arteries patent to the vertebrobasilar junction without stenosis. Posterior inferior cerebral arteries patent bilaterally. Basilar patent to its distal aspect. Superior cerebellar arteries patent bilaterally. Bilateral P1 segments originate from the basilar artery. PCAs perfused to their distal aspects without stenosis. The posterior communicating arteries are  not visualized. Anatomic variants: In the right posterior fossa, there is a dural arteriovenous fistula (series 3, images 70 and series 6, image 1). Arterialized blood is noted in the distal transverse and sigmoid sinus, with high signal on the MRA, and enlarged feeding arteries, which appear to be branches of the right external carotid artery, in particular the occipital artery and middle meningeal artery. No evidence of jugular bulb diverticulum or high-riding jugular bulb, sigmoid sinus diverticulum, or mastoid emissary veins. MRA NECK FINDINGS Aortic arch: Three-vessel arch.  No stenosis or dissection. Right carotid system: No evidence of dissection, occlusion or hemodynamically significant stenosis (greater than 50%). Left carotid system: No evidence of dissection, occlusion or hemodynamically significant stenosis (greater than 50%). Vertebral arteries: No evidence of dissection, occlusion or hemodynamically significant stenosis (greater than 50%). Other: None IMPRESSION: 1. 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 Neuro-Interventional Radiology consultation is recommended. 2. No other acute intracranial process. 3.  No intracranial large vessel occlusion or significant stenosis. 4.  No hemodynamically significant stenosis in the neck. These results will be called to the ordering clinician or representative by the Radiologist Assistant, and communication documented in the PACS or [employer]. Electronically Signed   By: Merilyn Baba M.D.   On: 09/10/2021 16:05   MR ANGIO NECK W WO CONTRAST  Result Date: 09/10/2021 CLINICAL DATA:  Pulsatile tinnitus of right ear EXAM: MRI HEAD WITHOUT AND WITH CONTRAST MRA HEAD WITHOUT CONTRAST MRA NECK WITHOUT AND WITH CONTRAST TECHNIQUE: Multiplanar,  multi-echo pulse sequences of the brain and surrounding structures were acquired without and with intravenous contrast. Angiographic images of the Circle of [place] were acquired using MRA technique without intravenous contrast. Angiographic images of the neck were acquired using MRA technique without and with intravenous contrast. Carotid stenosis measurements (when applicable) are obtained utilizing NASCET criteria, using the distal internal carotid diameter as the denominator. CONTRAST:  57mL MULTIHANCE GADOBENATE DIMEGLUMINE 529 MG/ML IV SOLN COMPARISON:  None FINDINGS: MRI HEAD FINDINGS Brain: No restricted diffusion to suggest acute or subacute infarct. No acute hemorrhage, mass, mass effect, or midline shift. No hydrocephalus or extra-axial collection. Vascular: Normal flow voids. Skull and upper cervical spine: Normal marrow signal. Sinuses/Orbits: Mucosal thickening in the maxillary sinuses and ethmoid air cells. Otherwise negative. The mastoids are well aerated. Cranial Nerves VII and VIII: Normal bilaterally, without evidence of mass or abnormal enhancement. Cochleae: Normal bilaterally. Semicircular Canals: Normal bilaterally. Porus Acusticus: Normal bilaterally. Cerebellopontine Angle: Normal bilaterally, without evidence of mass. Vestibular Aqueducts: Normal bilaterally. MRA HEAD FINDINGS Anterior circulation: Both internal carotid arteries  are patent to the termini, without stenosis or other abnormality. No evidence of aberrant internal carotid artery. A1 segments patent. Normal anterior communicating artery. Anterior cerebral arteries are patent to their distal aspects. No M1 stenosis or occlusion. Normal MCA bifurcations. Distal MCA branches perfused and symmetric. Posterior circulation: Vertebral arteries patent to the vertebrobasilar junction without stenosis. Posterior inferior cerebral arteries patent bilaterally. Basilar patent to its distal aspect. Superior cerebellar arteries patent  bilaterally. Bilateral P1 segments originate from the basilar artery. PCAs perfused to their distal aspects without stenosis. The posterior communicating arteries are not visualized. Anatomic variants: In the right posterior fossa, there is a dural arteriovenous fistula (series 3, images 70 and series 6, image 1). Arterialized blood is noted in the distal transverse and sigmoid sinus, with high signal on the MRA, and enlarged feeding arteries, which appear to be branches of the right external carotid artery, in particular the occipital artery and middle meningeal artery. No evidence of jugular bulb diverticulum or high-riding jugular bulb, sigmoid sinus diverticulum, or mastoid emissary veins. MRA NECK FINDINGS Aortic arch: Three-vessel arch.  No stenosis or dissection. Right carotid system: No evidence of dissection, occlusion or hemodynamically significant stenosis (greater than 50%). Left carotid system: No evidence of dissection, occlusion or hemodynamically significant stenosis (greater than 50%). Vertebral arteries: No evidence of dissection, occlusion or hemodynamically significant stenosis (greater than 50%). Other: None IMPRESSION: 1. 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 Neuro-Interventional Radiology consultation is recommended. 2. No other acute intracranial process. 3.  No intracranial large vessel occlusion or significant stenosis. 4.  No hemodynamically significant stenosis in the neck. These results will be called to the ordering clinician or representative by the Radiologist Assistant, and communication documented in the PACS or [employer]. Electronically Signed   By: Wiliam Ke M.D.   On: 09/10/2021 16:05   MR BRAIN W WO CONTRAST  Result Date: 09/10/2021 CLINICAL DATA:   Pulsatile tinnitus of right ear EXAM: MRI HEAD WITHOUT AND WITH CONTRAST MRA HEAD WITHOUT CONTRAST MRA NECK WITHOUT AND WITH CONTRAST TECHNIQUE: Multiplanar, multi-echo pulse sequences of the brain and surrounding structures were acquired without and with intravenous contrast. Angiographic images of the Circle of [place] were acquired using MRA technique without intravenous contrast. Angiographic images of the neck were acquired using MRA technique without and with intravenous contrast. Carotid stenosis measurements (when applicable) are obtained utilizing NASCET criteria, using the distal internal carotid diameter as the denominator. CONTRAST:  77mL MULTIHANCE GADOBENATE DIMEGLUMINE 529 MG/ML IV SOLN COMPARISON:  None FINDINGS: MRI HEAD FINDINGS Brain: No restricted diffusion to suggest acute or subacute infarct. No acute hemorrhage, mass, mass effect, or midline shift. No hydrocephalus or extra-axial collection. Vascular: Normal flow voids. Skull and upper cervical spine: Normal marrow signal. Sinuses/Orbits: Mucosal thickening in the maxillary sinuses and ethmoid air cells. Otherwise negative. The mastoids are well aerated. Cranial Nerves VII and VIII: Normal bilaterally, without evidence of mass or abnormal enhancement. Cochleae: Normal bilaterally. Semicircular Canals: Normal bilaterally. Porus Acusticus: Normal bilaterally. Cerebellopontine Angle: Normal bilaterally, without evidence of mass. Vestibular Aqueducts: Normal bilaterally. MRA HEAD FINDINGS Anterior circulation: Both internal carotid arteries are patent to the termini, without stenosis or other abnormality. No evidence of aberrant internal carotid artery. A1 segments patent. Normal anterior communicating artery. Anterior cerebral arteries are patent to their distal aspects.  No M1 stenosis or occlusion. Normal MCA bifurcations. Distal MCA branches perfused and symmetric. Posterior circulation: Vertebral arteries patent to the vertebrobasilar junction  without stenosis. Posterior inferior cerebral arteries patent bilaterally. Basilar patent to its distal aspect. Superior cerebellar arteries patent bilaterally. Bilateral P1 segments originate from the basilar artery. PCAs perfused to their distal aspects without stenosis. The posterior communicating arteries are not visualized. Anatomic variants: In the right posterior fossa, there is a dural arteriovenous fistula (series 3, images 70 and series 6, image 1). Arterialized blood is noted in the distal transverse and sigmoid sinus, with high signal on the MRA, and enlarged feeding arteries, which appear to be branches of the right external carotid artery, in particular the occipital artery and middle meningeal artery. No evidence of jugular bulb diverticulum or high-riding jugular bulb, sigmoid sinus diverticulum, or mastoid emissary veins. MRA NECK FINDINGS Aortic arch: Three-vessel arch.  No stenosis or dissection. Right carotid system: No evidence of dissection, occlusion or hemodynamically significant stenosis (greater than 50%). Left carotid system: No evidence of dissection, occlusion or hemodynamically significant stenosis (greater than 50%). Vertebral arteries: No evidence of dissection, occlusion or hemodynamically significant stenosis (greater than 50%). Other: None IMPRESSION: 1. 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 Neuro-Interventional Radiology consultation is recommended. 2. No other acute intracranial process. 3.  No intracranial large vessel occlusion or significant stenosis. 4.  No hemodynamically significant stenosis in the neck. These results will be called to the ordering clinician or representative by the Radiologist Assistant, and communication documented in the PACS or [employer]. Electronically Signed   By: Merilyn Baba M.D.   On: 09/10/2021 16:05   US Carotid Bilateral  Result Date: 09/10/2021 CLINICAL DATA:  Pulsatile tinnitus EXAM: BILATERAL CAROTID DUPLEX ULTRASOUND TECHNIQUE: Pearline Cables scale imaging, color Doppler and duplex ultrasound were performed of bilateral carotid and vertebral arteries in the neck. COMPARISON:  None. FINDINGS: Criteria: Quantification of carotid stenosis is based on velocity parameters that correlate the residual internal carotid diameter with NASCET-based stenosis levels, using the diameter of the distal internal carotid lumen as the denominator for stenosis measurement. The following velocity measurements were obtained: RIGHT ICA: 75/31 cm/sec CCA: [id number] cm/sec SYSTOLIC ICA/CCA RATIO:  0.8 ECA: 109 cm/sec LEFT ICA: 82/30 cm/sec CCA: [id number] cm/sec SYSTOLIC ICA/CCA RATIO:  1.2 ECA: 44 cm/sec RIGHT CAROTID ARTERY: No significant atheromatous plaque. RIGHT VERTEBRAL ARTERY:  Antegrade flow. LEFT CAROTID ARTERY:  No significant atheromatous plaque. LEFT VERTEBRAL ARTERY:  Antegrade flow. IMPRESSION: No significant stenosis of internal carotid arteries. Electronically Signed   By: Miachel Roux M.D.   On: 09/10/2021 13:45    Labs:  CBC: No results for input(s): WBC, HGB, HCT, PLT in the last 8760 hours.  COAGS: No results for input(s): INR, APTT in the last 8760 hours.  BMP: No results for input(s): NA, K, CL, CO2, GLUCOSE, BUN, CALCIUM, CREATININE, GFRNONAA, GFRAA in the last 8760 hours.  Invalid input(s): CMP  LIVER FUNCTION TESTS: No results for input(s): BILITOT, AST, ALT, ALKPHOS, PROT, ALBUMIN in the last 8760 hours.  TUMOR MARKERS: No results for input(s): AFPTM, CEA, CA199, CHROMGRNA in the last 8760 hours.  Assessment and Plan:  41 year old female with right-sided pulsatile tinnitus related to a right transverse/sigmoid sinus dural AV fistula.  I recommended a diagnostic cerebral angiogram to evaluate for  right-sided fistula  for treatment planning purposes.  Risks and benefits were explained.  All questions were answered to her satisfaction.  She would like to proceed with the angiogram.  Our schedulers we will reach out to her to book the angiogram under moderate sedation.   Thank you for this interesting consult.  I greatly enjoyed meeting Cassandra Jefferson and look forward to participating in her care.  A copy of this report was sent to the requesting provider on this date.  Electronically Signed: Pedro Earls, MD 09/21/2021, 9:21 AM   I spent a total of  40 Minutes   in face to face in clinical consultation, greater than 50% of which was counseling/coordinating care for pulsatile tinnitus.

## 2021-09-22 ENCOUNTER — Other Ambulatory Visit (HOSPITAL_COMMUNITY): Payer: Self-pay | Admitting: Neuroradiology

## 2021-09-22 DIAGNOSIS — I671 Cerebral aneurysm, nonruptured: Secondary | ICD-10-CM

## 2021-09-23 ENCOUNTER — Other Ambulatory Visit: Payer: Self-pay | Admitting: Radiology

## 2021-09-24 ENCOUNTER — Other Ambulatory Visit: Payer: Self-pay

## 2021-09-24 ENCOUNTER — Encounter (HOSPITAL_COMMUNITY): Payer: Self-pay

## 2021-09-24 ENCOUNTER — Other Ambulatory Visit (HOSPITAL_COMMUNITY): Payer: Self-pay | Admitting: Neuroradiology

## 2021-09-24 ENCOUNTER — Ambulatory Visit (HOSPITAL_COMMUNITY)
Admission: RE | Admit: 2021-09-24 | Discharge: 2021-09-24 | Disposition: A | Discharge status: Home / Self Care | Payer: BC Managed Care – PPO | Source: Ambulatory Visit | Attending: Neuroradiology | Admitting: Neuroradiology

## 2021-09-24 DIAGNOSIS — I770 Arteriovenous fistula, acquired: Secondary | ICD-10-CM | POA: Diagnosis not present

## 2021-09-24 DIAGNOSIS — I671 Cerebral aneurysm, nonruptured: Secondary | ICD-10-CM

## 2021-09-24 DIAGNOSIS — H93A1 Pulsatile tinnitus, right ear: Secondary | ICD-10-CM | POA: Diagnosis present

## 2021-09-24 DIAGNOSIS — K589 Irritable bowel syndrome without diarrhea: Secondary | ICD-10-CM | POA: Diagnosis not present

## 2021-09-24 HISTORY — PX: IR ANGIO VERTEBRAL SEL VERTEBRAL BILAT MOD SED: IMG5369

## 2021-09-24 HISTORY — PX: IR ANGIO INTRA EXTRACRAN SEL INTERNAL CAROTID BILAT MOD SED: IMG5363

## 2021-09-24 HISTORY — PX: IR US GUIDE VASC ACCESS RIGHT: IMG2390

## 2021-09-24 HISTORY — PX: IR ANGIO EXTERNAL CAROTID SEL EXT CAROTID BILAT MOD SED: IMG5372

## 2021-09-24 LAB — CBC
HCT: 47.1 % — ABNORMAL HIGH (ref 36.0–46.0)
Hemoglobin: 15.1 g/dL — ABNORMAL HIGH (ref 12.0–15.0)
MCH: 31.1 pg (ref 26.0–34.0)
MCHC: 32.1 g/dL (ref 30.0–36.0)
MCV: 97.1 fL (ref 80.0–100.0)
Platelets: 152 10*3/uL (ref 150–400)
RBC: 4.85 MIL/uL (ref 3.87–5.11)
RDW: 11.7 % (ref 11.5–15.5)
WBC: 5 10*3/uL (ref 4.0–10.5)
nRBC: 0 % (ref 0.0–0.2)

## 2021-09-24 LAB — BASIC METABOLIC PANEL
Anion gap: 9 (ref 5–15)
BUN: 10 mg/dL (ref 6–20)
CO2: 28 mmol/L (ref 22–32)
Calcium: 9.7 mg/dL (ref 8.9–10.3)
Chloride: 101 mmol/L (ref 98–111)
Creatinine, Ser: 0.67 mg/dL (ref 0.44–1.00)
GFR, Estimated: >60 mL/min (ref >60)
Glucose, Bld: 90 mg/dL (ref 70–99)
Potassium: 4.2 mmol/L (ref 3.5–5.1)
Sodium: 138 mmol/L (ref 135–145)

## 2021-09-24 LAB — PROTIME-INR
INR: 1.1 (ref 0.8–1.2)
Prothrombin Time: 13.8 seconds (ref 11.4–15.2)

## 2021-09-24 LAB — PREGNANCY, URINE: Preg Test, Ur: NEGATIVE

## 2021-09-24 IMAGING — XA IR CAROTID INTERNAL HEAD/NECK BILAT  (MS)
11 of 18 series · 11 of 24 positions shown · IV contrast (IODINE)
Comparison: MRI/MR angiogram of the head and neck [DATE].

INDICATION: 40-year-old female with past medical history significant for chronic
headache, IBS and thyroid disease presenting with pulsatile
tinnitus. MR angiogram performed [DATE] was suggestive of a
right transverse sinus dural AV fistula. She comes to our service
today for a diagnostic cerebral angiogram to evaluate MRI findings.

EXAM:
ULTRASOUND-GUIDED VASCULAR [REDACTED] CEREBRAL ANGIOGRAM
TECHNIQUE: Informed written consent was obtained from the patient after a
thorough discussion of the procedural risks, benefits and
alternatives. All questions were addressed.

[Series 2: cerebral care 2 · 1 of 9 frames shown (1 of 5)]
[frame 6/9]
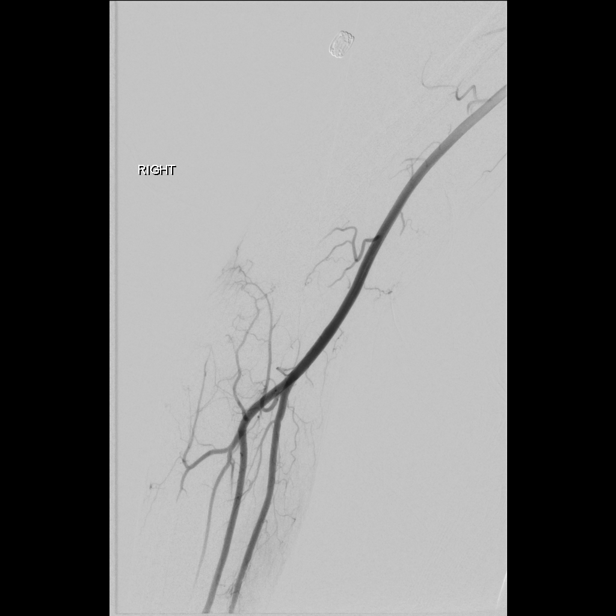

[Series 4: cerebral care 2 · 2 acquisitions, 1 frame shown (2 of 5)]
[im 1/2]
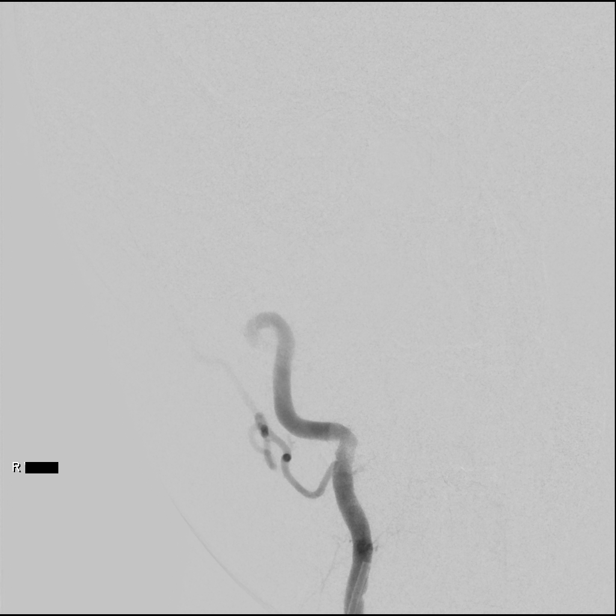

[Series 5: fl neuro n · 1 of 27 frames shown (1 of 2)]
[frame 14/27]
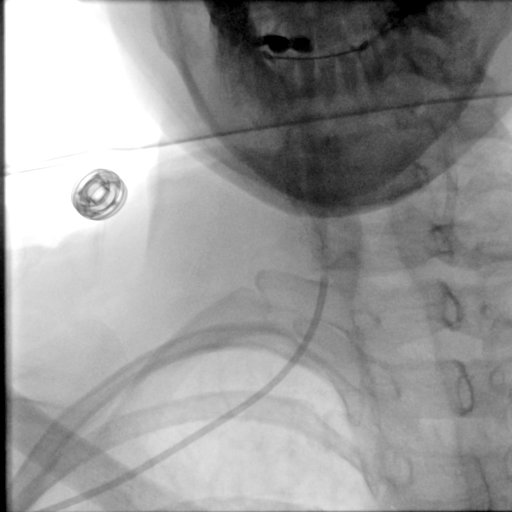

[Series 7: cerebral care 2 · 2 acquisitions, 1 frame shown (3 of 5)]
[im 1/2]
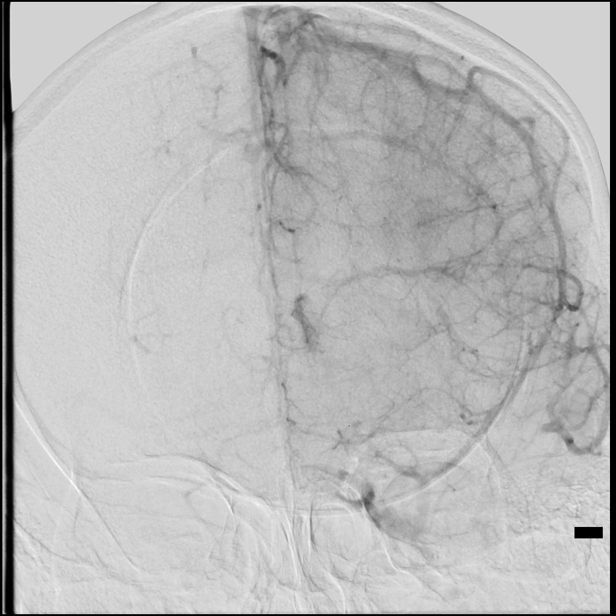

[Series 8: cerebral care 2 · 2 acquisitions, 1 frame shown (4 of 5)]
[im 1/2]
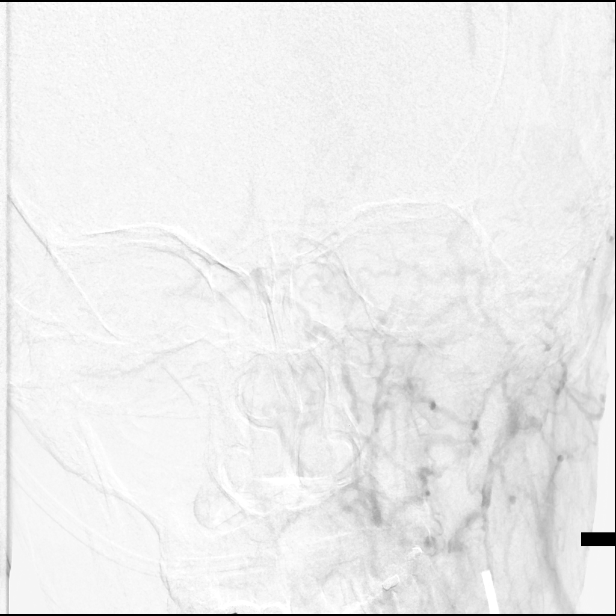

[Series 12: cerebral care 2 · 2 acquisitions, 1 frame shown (5 of 5)]
[im 1/2]
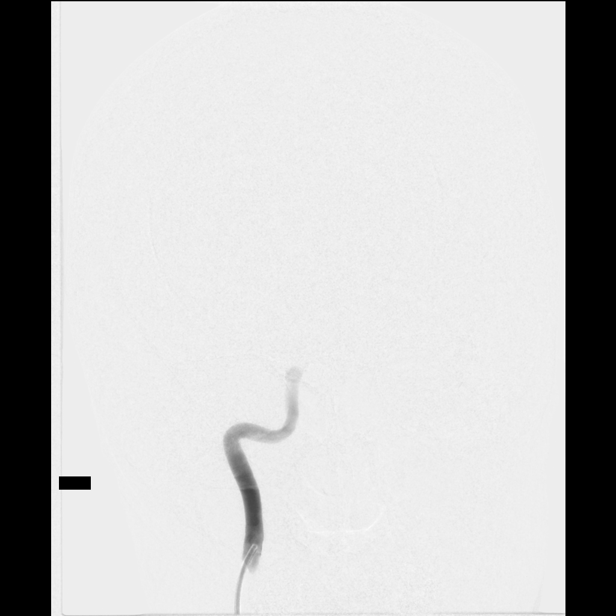

[Series 13: cerebral 2 · 2 acquisitions, 1 frame shown (1 of 3)]
[im 1/2]
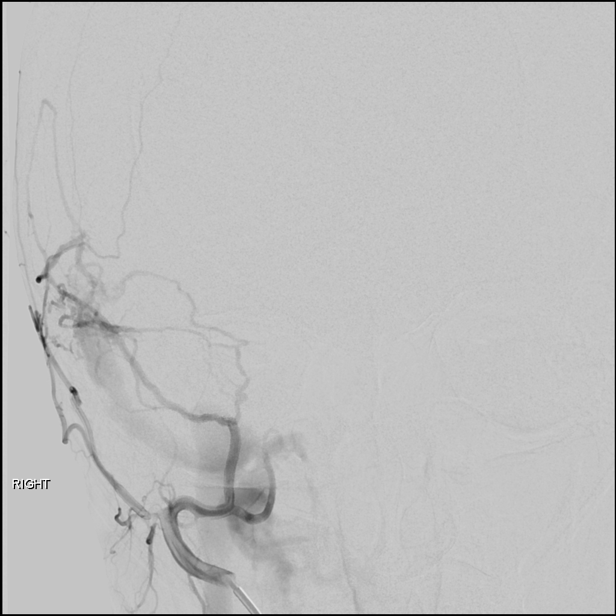

[Series 15: cerebral 2 · 2 acquisitions, 1 frame shown (2 of 3)]
[im 1/2]
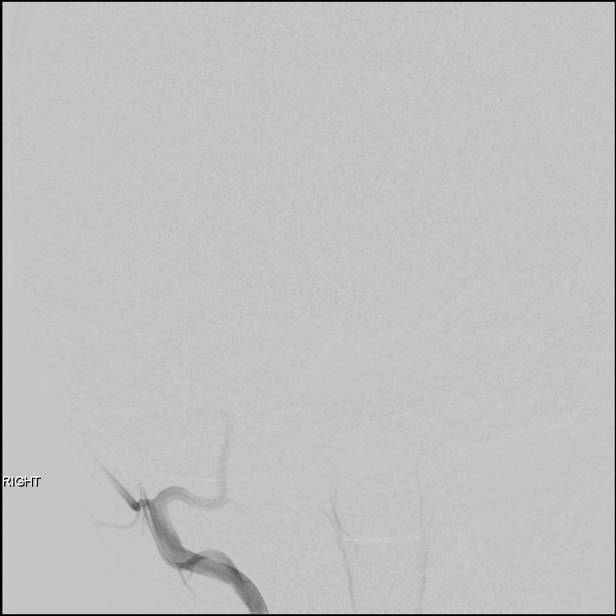

[Series 16: cerebral 2 · 2 acquisitions, 1 frame shown (3 of 3)]
[im 1/2]
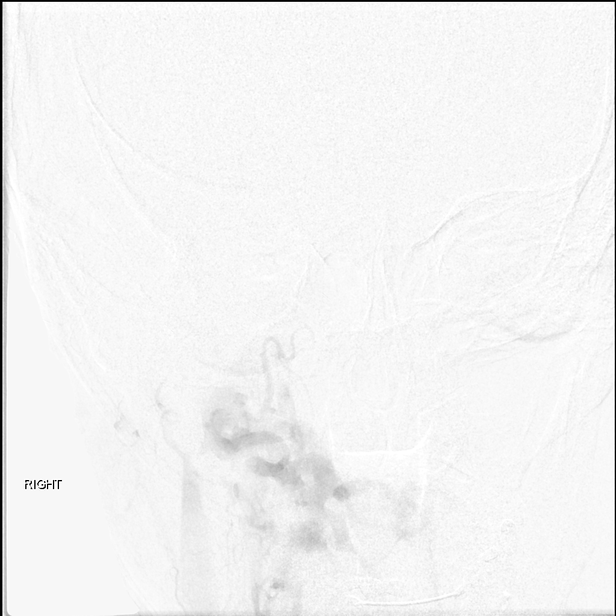

[Series 18: fl neuro n · 1 of 131 frames shown (2 of 2)]
[frame 66/131]
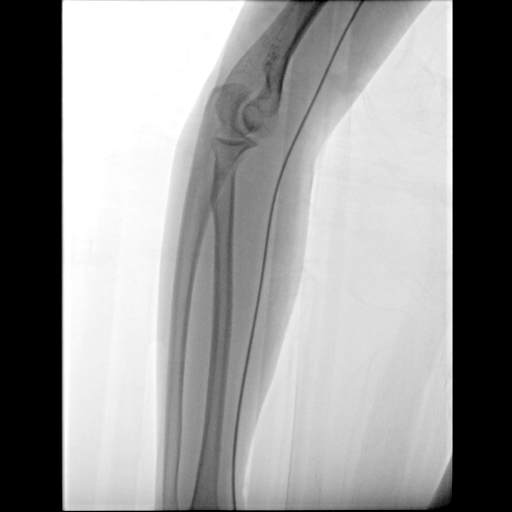

[Series 300: dr. (person_name). · 1 of 33 slices shown]
[im 17/33]
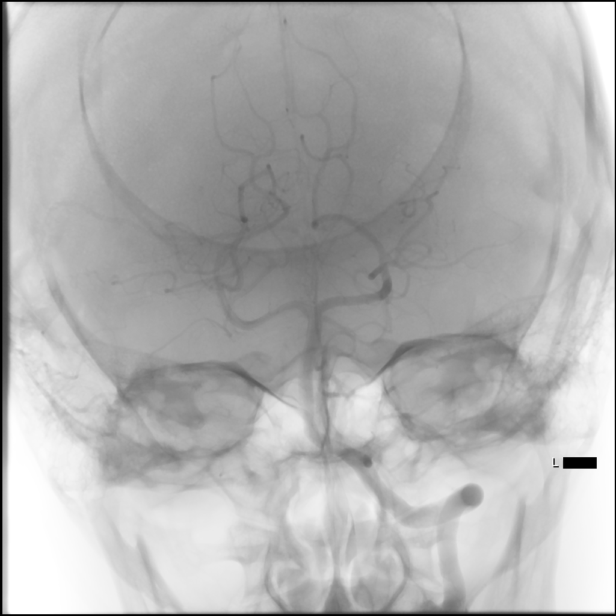

[11 of 24 positions shown; findings below may reference images not displayed]

MEDICATIONS:
5,000 KAKI heparin, 5 mg Verapamil and 400 mcg nitroglycerin

ANESTHESIA/SEDATION:
Moderate (conscious) sedation was employed during this procedure. A
total of Versed 1 mg and Fentanyl 25 mcg was administered
intravenously by the radiology nurse.

Total intra-service moderate Sedation Time: 67 minutes. The
patient's level of consciousness and vital signs were monitored
continuously by radiology nursing throughout the procedure under my
direct supervision.

CONTRAST:  90 mL of Omnipaque 300 milligram/mL

FLUOROSCOPY TIME:  Fluoroscopy Time: 14 minutes 36 seconds (781
mGy).

COMPLICATIONS:
None immediate.
Maximal Sterile Barrier Technique was utilized including caps, mask,
sterile gowns, sterile gloves, sterile drape, hand hygiene and skin
antiseptic. A timeout was performed prior to the initiation of the
procedure.

Using the modified Seldinger technique and a micropuncture kit,
access was gained to the right radial artery at the wrist and a 5
French sheath was placed. Real-time ultrasound guidance was utilized
for vascular access including the acquisition of a permanent
ultrasound image documenting patency of the accessed vessel. Slow
intra arterial infusion of 5,000 KAKI heparin, 5 mg Verapamil and 200
mcg nitroglycerin diluted in patient's own blood was performed. No
significant fluctuation in patient's blood pressure seen. Then, a
right radial artery angiogram was obtained via sheath side port.
Normal brachial artery branching pattern seen. No significant
anatomical variation. The right radial artery caliber is adequate
for vascular access.

Next, a 5 KAKI 2 glide catheter was navigated over a
0.035" Terumo Glidewire into the right subclavian artery under
fluoroscopic guidance. Using roadmap guidance, the catheter was
advanced into the right vertebral artery. Frontal and lateral
angiograms of the head were obtained followed by magnified right
anterior oblique and magnified lateral views of the skull base.

The catheter was then navigated into the aortic arch where the
catheter tip was reformed. The catheter was then placed into the
left common carotid artery. Frontal and lateral angiograms of the
neck were obtained. Using biplane roadmap, the catheter was
navigated into the left internal carotid artery. Frontal and lateral
angiograms of the head were obtained. The catheter was then
retracted into the left common carotid artery. Using biplane
roadmap, the catheter was advanced into the left external carotid
artery. Frontal and lateral angiograms of the head were obtained.

Next, the catheter was placed into the left subclavian artery. Using
roadmap guidance, the catheter was placed into the left vertebral
artery. Frontal and lateral angiograms of the head were obtained.

The catheter was then placed into the right common carotid artery.
Frontal and lateral angiograms of the neck were obtained. Using
biplane roadmap, the catheter was advanced into the right internal
carotid artery. Frontal and lateral angiograms of the head were
obtained. The catheter was retracted into the right common carotid
artery and under biplane roadmap the catheter was advanced into the
right internal maxillary artery. Frontal, lateral and bilateral
magnified oblique views of the head obtained. Then, the catheter was
retracted into the proximal right external carotid artery. Frontal
and lateral angiograms of the head were obtained. The catheter was
then retracted to the level of the right occipital artery. Frontal
and lateral angiograms of the head were obtained.

The catheter was retracted into the mid right radial artery. 200 mcg
of nitroglycerin was injected 4 vasospasm treatment. The catheter
was subsequently withdrawn.

An inflatable band was placed and inflated over the right wrist
access site. The vascular sheath was withdrawn and the band was
slowly deflated until brisk flow was noted through the arteriotomy
site. At this point, the band was reinflated with additional 3 cc of
air to obtain patent hemostasis.
FINDINGS: Right radial artery ultrasound and right radial artery angiogram:
The caliber of the distal right radial artery is appropriate for
angiogram access. The right radial artery and the right ulnar artery
have normal course and caliber. No significant anatomical variants
noted.

Right vertebral artery angiograms: The right vertebral artery,
basilar artery, and bilateral posterior cerebral arteries are
unremarkable. Luminal caliber is smooth and tapering. No aneurysms
are seen. Dural AV fistula seen with early opacification of the
right sigmoid sinus via posterior meningeal artery. Visualized dural
sinuses are patent.

Left CCA angiograms: Cervical angiograms show normal course and
caliber of the visualized left common carotid and internal carotid
arteries. There are no significant stenoses.

Left ICA angiograms: There is brisk vascular contrast filling of the
left ACA and MCA vascular trees. Luminal caliber is smooth and
tapering. No aneurysms or abnormally high-flow, early draining veins
are seen. No regions of abnormal hypervascularity are noted. No
venous drainage in the to the right transverse or sigmoid sinus. The
left transverse and sigmoid sinuses are widely patent.

Left ECA angiograms: No early venous drainage was noted. The
visualized branches of the left external carotid artery are
unremarkable.

Left vertebral artery angiograms: The left vertebral artery, basilar
artery, and bilateral posterior cerebral arteries are unremarkable.
Luminal caliber is smooth and tapering. No aneurysms are seen. Right
transverse/sigmoid sinus dural AV fistula in supplied by right
posterior meningeal artery as well as right AICA and SCA branches.
Normal venous drainage is for KAKI through the left transverse
and sigmoid sinus.

Right CCA angiograms: Cervical angiograms show normal course and
caliber of the visualized right common carotid and internal carotid
arteries. There are no significant stenoses.

Right ICA angiograms: There is brisk vascular contrast filling of
the right ACA and MCA vascular trees. Luminal caliber is smooth and
tapering. No aneurysms are seen. No regions of abnormal
hypervascularity are noted. Faint early opacification of the right
transverse sinus is seen via prominent artery of KAKI and
KAKI. Preferential interior drainage into the sphenoid parietal
sinus and posteriorly into the left transverse and sigmoid sinus.

Right ECA angiograms: Prominent dural AV fistula seen in the left
transverse/sigmoid sinus supplied by branches of the right middle
meningeal artery, posterior ring level artery and occipital artery.
The venous drainage is anterograde. No evidence of cortical venous
reflux.

PROCEDURE:
No intervention performed.
IMPRESSION: Right transverse and sigmoid sinus dural arteriovenous fistula
supplied by branches of the right external carotid artery, right
inferolateral trunk, right posterior meningeal artery, a right AICA
and right SCA. Venous drainage remains anterograde with no evidence
of cortical venous reflux. Physiological venous drainage via left
transverse and sigmoid sinus as well as bilateral sphenoparietal
sinuses.

PLAN:
Patient will return for office consultation to discuss dural AV
fistula management.

## 2021-09-24 MED ORDER — VERAPAMIL HCL 2.5 MG/ML IV SOLN
INTRAVENOUS | Status: AC | PRN
  Administered 2021-09-24: 5 mg via INTRAVENOUS

## 2021-09-24 MED ORDER — HEPARIN SODIUM (PORCINE) 1000 UNIT/ML IJ SOLN
INTRAMUSCULAR | Status: AC | PRN
  Administered 2021-09-24: 5000 [IU] via INTRAVENOUS

## 2021-09-24 MED ORDER — FENTANYL CITRATE (PF) 100 MCG/2ML IJ SOLN
INTRAMUSCULAR | Status: AC | PRN
  Administered 2021-09-24: 25 ug via INTRAVENOUS

## 2021-09-24 MED ORDER — IOHEXOL 300 MG/ML  SOLN
100.0000 mL | Freq: Once | INTRAMUSCULAR | Status: AC | PRN
  Administered 2021-09-24: 90 mL via INTRA_ARTERIAL

## 2021-09-24 MED ORDER — HEPARIN SODIUM (PORCINE) 1000 UNIT/ML IJ SOLN
INTRAMUSCULAR | Status: AC
  Filled 2021-09-24: qty 3

## 2021-09-24 MED ORDER — VERAPAMIL HCL 2.5 MG/ML IV SOLN
INTRAVENOUS | Status: AC
  Filled 2021-09-24: qty 2

## 2021-09-24 MED ORDER — SODIUM CHLORIDE (PF) 0.9 % IJ SOLN
INTRAVENOUS | Status: AC | PRN
  Administered 2021-09-24 (×2): 200 ug via INTRA_ARTERIAL

## 2021-09-24 MED ORDER — FENTANYL CITRATE (PF) 100 MCG/2ML IJ SOLN
INTRAMUSCULAR | Status: AC
  Filled 2021-09-24: qty 2

## 2021-09-24 MED ORDER — MIDAZOLAM HCL 2 MG/2ML IJ SOLN
INTRAMUSCULAR | Status: AC
  Filled 2021-09-24: qty 2

## 2021-09-24 MED ORDER — MIDAZOLAM HCL 2 MG/2ML IJ SOLN
INTRAMUSCULAR | Status: AC | PRN
  Administered 2021-09-24: 1 mg via INTRAVENOUS

## 2021-09-24 MED ORDER — NITROGLYCERIN 1 MG/10 ML FOR IR/CATH LAB
INTRA_ARTERIAL | Status: AC
  Filled 2021-09-24: qty 10

## 2021-09-24 MED ORDER — SODIUM CHLORIDE 0.9 % IV SOLN: Freq: Once | INTRAVENOUS | Status: AC

## 2021-09-24 MED ORDER — HEPARIN SODIUM (PORCINE) 1000 UNIT/ML IJ SOLN
INTRAMUSCULAR | Status: AC
  Filled 2021-09-24: qty 10

## 2021-09-24 MED ORDER — LIDOCAINE HCL 1 % IJ SOLN
INTRAMUSCULAR | Status: AC
  Filled 2021-09-24: qty 20

## 2021-09-24 NOTE — Sedation Documentation (Signed)
Pt transported to short stay room 19 via stretcher accompanied by RN. Anisha RN at bedside to receive pt. Right radial site remains level 0 with TR band in place. Pt alert awake and oriented. No s/s of distress at this time.

## 2021-09-24 NOTE — H&P (Signed)
Chief Complaint: Patient was seen in consultation today for pulsatile tinnitus  Referring Physician(s): de Marcial Pacas Rodrigues,Katyucia  Supervising Physician: Baldemar Lenis  Patient Status: [facility] - Out-pt  History of Present Illness: Cassandra Jefferson is a 41 y.o. female with past medical history of chronic headaches, IBS, who was recently seen in consultation with Dr. Tommie Sams for pulsatile tinnitus.  Patient discussed possible evaluation and treatment options for her tinnitus.  She has elected to proceed with diagnostic angiogram and presents for the procedure today.   Cassandra Jefferson presents for procedure today in her usual state of health.  She confirms the above history and reports her symptoms are getting worse/louder. She is agreeable to proceed with diagnostic angiogram today.  She has been NPO.  She does not take blood thinners.   Past Medical History:  Diagnosis Date   Chronic headaches    IBS (irritable bowel syndrome)    Thyroid disease     Past Surgical History:  Procedure Laterality Date   DIAGNOSTIC LAPAROSCOPY     wisom teeth      Allergies: Erythromycin and Penicillins  Medications: Prior to Admission medications   Medication Sig Start Date End Date Taking? Authorizing Provider  ALPRAZolam Prudy Feeler) 0.5 MG tablet Take 0.5 mg by mouth 3 (three) times daily as needed for anxiety.   Yes [provider]  cetirizine (ZYRTEC) 10 MG tablet Take 10 mg by mouth daily as needed for allergies.   Yes [provider]  cholecalciferol (VITAMIN D3) 25 MCG (1000 UNIT) tablet Take 1,000 Units by mouth daily.   Yes [provider]  cyproheptadine (PERIACTIN) 4 MG tablet Take 4 mg by mouth at bedtime as needed for allergies. 09/29/15  Yes [provider]  ibuprofen (ADVIL) 200 MG tablet Take 600 mg by mouth every 6 (six) hours as needed for moderate pain or headache.   Yes [provider]  levonorgestrel (MIRENA)  20 MCG/24HR IUD 1 each by Intrauterine route once.     Yes [provider]  MELATONIN-MAGNESIUM CITRATE PO Take 1 Dose by mouth at bedtime as needed (sleep). 1 dose = 2 teaspoons   Yes [provider]  thyroid (ARMOUR) 120 MG tablet Take 120 mg by mouth daily.   Yes [provider]  tretinoin (RETIN-A) 0.025 % cream Apply 1 application topically at bedtime.   Yes [provider]  albuterol (VENTOLIN HFA) 108 (90 Base) MCG/ACT inhaler Inhale 2 puffs into the lungs every 6 (six) hours as needed for wheezing or shortness of breath.    [provider]  valACYclovir (VALTREX) 1000 MG tablet Take 1,000 mg by mouth 2 (two) times daily as needed (fever blisters).    [provider]     Family History  Problem Relation Age of Onset   Clotting disorder Mother    Irritable bowel syndrome Mother    Diabetes Sister    Irritable bowel syndrome Sister    Prostate cancer Maternal Uncle    Heart disease Maternal Uncle    Diabetes Maternal Grandmother    Heart disease Maternal Grandmother    Stomach cancer Maternal Grandfather    Colon cancer Maternal Grandfather    Heart disease Maternal Grandfather    Ovarian cancer Paternal Grandmother    Melanoma Father     Social History   Socioeconomic History   Marital status: Married    Spouse name: Not on file   Number of children: 0   Years of education: Not  on file   Highest education level: Not on file  Occupational History   Occupation: Associate Professorharmacy Tech    Employer: [employer]  Tobacco Use   Smoking status: Never   Smokeless tobacco: Never  Substance and Sexual Activity   Alcohol use: Yes    Comment: occ   Drug use: No   Sexual activity: Not on file  Other Topics Concern   Not on file  Social History Narrative   Not on file   Social Determinants of Health   Financial Resource Strain: Not on file  Food Insecurity: Not on file  Transportation Needs: Not on file  Physical  Activity: Not on file  Stress: Not on file  Social Connections: Not on file     Review of Systems: A 12 point ROS discussed and pertinent positives are indicated in the HPI above.  All other systems are negative.  Review of Systems  Constitutional:  Negative for fatigue and fever.  HENT:  Positive for tinnitus.   Respiratory:  Negative for cough and shortness of breath.   Cardiovascular:  Negative for chest pain.  Gastrointestinal:  Negative for abdominal pain, nausea and vomiting.  Musculoskeletal:  Negative for back pain.  Psychiatric/Behavioral:  Negative for behavioral problems and confusion.    Vital Signs: BP 123/90    Pulse 79    Temp 98.4 F (36.9 C) (Oral)    Resp 16    Ht 5' 0.5" (1.537 m)    Wt 102 lb (46.3 kg)    SpO2 100%    BMI 19.59 kg/m   Physical Exam Vitals and nursing note reviewed.  Constitutional:      General: She is not in acute distress.    Appearance: Normal appearance. She is not ill-appearing.  HENT:     Mouth/Throat:     Mouth: Mucous membranes are moist.     Pharynx: Oropharynx is clear.  Cardiovascular:     Rate and Rhythm: Normal rate and regular rhythm.  Pulmonary:     Effort: Pulmonary effort is normal. No respiratory distress.     Breath sounds: Normal breath sounds.  Abdominal:     General: Abdomen is flat.     Palpations: Abdomen is soft.  Musculoskeletal:        General: Normal range of motion.  Skin:    General: Skin is warm and dry.  Neurological:     General: No focal deficit present.     Mental Status: She is alert and oriented to person, place, and time. Mental status is at baseline.  Psychiatric:        Mood and Affect: Mood normal.        Behavior: Behavior normal.        Thought Content: Thought content normal.        Judgment: Judgment normal.     MD Evaluation Airway: WNL Heart: WNL Abdomen: WNL Chest/ Lungs: WNL ASA  Classification: 3 Mallampati/Airway Score: Two   Imaging: MR ANGIO HEAD WO  CONTRAST  Result Date: 09/10/2021 CLINICAL DATA:  Pulsatile tinnitus of right ear EXAM: MRI HEAD WITHOUT AND WITH CONTRAST MRA HEAD WITHOUT CONTRAST MRA NECK WITHOUT AND WITH CONTRAST TECHNIQUE: Multiplanar, multi-echo pulse sequences of the brain and surrounding structures were acquired without and with intravenous contrast. Angiographic images of the Circle of Willis were acquired using MRA technique without intravenous contrast. Angiographic images of the neck were acquired using MRA technique without and with intravenous contrast. Carotid stenosis measurements (when applicable) are obtained  utilizing NASCET criteria, using the distal internal carotid diameter as the denominator. CONTRAST:  9mL MULTIHANCE GADOBENATE DIMEGLUMINE 529 MG/ML IV SOLN COMPARISON:  None FINDINGS: MRI HEAD FINDINGS Brain: No restricted diffusion to suggest acute or subacute infarct. No acute hemorrhage, mass, mass effect, or midline shift. No hydrocephalus or extra-axial collection. Vascular: Normal flow voids. Skull and upper cervical spine: Normal marrow signal. Sinuses/Orbits: Mucosal thickening in the maxillary sinuses and ethmoid air cells. Otherwise negative. The mastoids are well aerated. Cranial Nerves VII and VIII: Normal bilaterally, without evidence of mass or abnormal enhancement. Cochleae: Normal bilaterally. Semicircular Canals: Normal bilaterally. Porus Acusticus: Normal bilaterally. Cerebellopontine Angle: Normal bilaterally, without evidence of mass. Vestibular Aqueducts: Normal bilaterally. MRA HEAD FINDINGS Anterior circulation: Both internal carotid arteries are patent to the termini, without stenosis or other abnormality. No evidence of aberrant internal carotid artery. A1 segments patent. Normal anterior communicating artery. Anterior cerebral arteries are patent to their distal aspects. No M1 stenosis or occlusion. Normal MCA bifurcations. Distal MCA branches perfused and symmetric. Posterior circulation:  Vertebral arteries patent to the vertebrobasilar junction without stenosis. Posterior inferior cerebral arteries patent bilaterally. Basilar patent to its distal aspect. Superior cerebellar arteries patent bilaterally. Bilateral P1 segments originate from the basilar artery. PCAs perfused to their distal aspects without stenosis. The posterior communicating arteries are not visualized. Anatomic variants: In the right posterior fossa, there is a dural arteriovenous fistula (series 3, images 70 and series 6, image 1). Arterialized blood is noted in the distal transverse and sigmoid sinus, with high signal on the MRA, and enlarged feeding arteries, which appear to be branches of the right external carotid artery, in particular the occipital artery and middle meningeal artery. No evidence of jugular bulb diverticulum or high-riding jugular bulb, sigmoid sinus diverticulum, or mastoid emissary veins. MRA NECK FINDINGS Aortic arch: Three-vessel arch.  No stenosis or dissection. Right carotid system: No evidence of dissection, occlusion or hemodynamically significant stenosis (greater than 50%). Left carotid system: No evidence of dissection, occlusion or hemodynamically significant stenosis (greater than 50%). Vertebral arteries: No evidence of dissection, occlusion or hemodynamically significant stenosis (greater than 50%). Other: None IMPRESSION: 1. 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 Neuro-Interventional Radiology consultation is recommended. 2. No other acute intracranial process. 3.  No intracranial large vessel occlusion or significant stenosis. 4.  No hemodynamically significant stenosis in the neck. These results will be called to the ordering clinician or representative by the Radiologist  Assistant, and communication documented in the PACS or [employer]. Electronically Signed   By: Wiliam Ke M.D.   On: 09/10/2021 16:05   MR ANGIO NECK W WO CONTRAST  Result Date: 09/10/2021 CLINICAL DATA:  Pulsatile tinnitus of right ear EXAM: MRI HEAD WITHOUT AND WITH CONTRAST MRA HEAD WITHOUT CONTRAST MRA NECK WITHOUT AND WITH CONTRAST TECHNIQUE: Multiplanar, multi-echo pulse sequences of the brain and surrounding structures were acquired without and with intravenous contrast. Angiographic images of the Circle of Willis were acquired using MRA technique without intravenous contrast. Angiographic images of the neck were acquired using MRA technique without and with intravenous contrast. Carotid stenosis measurements (when applicable) are obtained utilizing NASCET criteria, using the distal internal carotid diameter as the denominator. CONTRAST:  9mL MULTIHANCE GADOBENATE DIMEGLUMINE 529 MG/ML IV SOLN COMPARISON:  None FINDINGS: MRI HEAD FINDINGS Brain: No restricted  diffusion to suggest acute or subacute infarct. No acute hemorrhage, mass, mass effect, or midline shift. No hydrocephalus or extra-axial collection. Vascular: Normal flow voids. Skull and upper cervical spine: Normal marrow signal. Sinuses/Orbits: Mucosal thickening in the maxillary sinuses and ethmoid air cells. Otherwise negative. The mastoids are well aerated. Cranial Nerves VII and VIII: Normal bilaterally, without evidence of mass or abnormal enhancement. Cochleae: Normal bilaterally. Semicircular Canals: Normal bilaterally. Porus Acusticus: Normal bilaterally. Cerebellopontine Angle: Normal bilaterally, without evidence of mass. Vestibular Aqueducts: Normal bilaterally. MRA HEAD FINDINGS Anterior circulation: Both internal carotid arteries are patent to the termini, without stenosis or other abnormality. No evidence of aberrant internal carotid artery. A1 segments patent. Normal anterior communicating artery. Anterior cerebral arteries  are patent to their distal aspects. No M1 stenosis or occlusion. Normal MCA bifurcations. Distal MCA branches perfused and symmetric. Posterior circulation: Vertebral arteries patent to the vertebrobasilar junction without stenosis. Posterior inferior cerebral arteries patent bilaterally. Basilar patent to its distal aspect. Superior cerebellar arteries patent bilaterally. Bilateral P1 segments originate from the basilar artery. PCAs perfused to their distal aspects without stenosis. The posterior communicating arteries are not visualized. Anatomic variants: In the right posterior fossa, there is a dural arteriovenous fistula (series 3, images 70 and series 6, image 1). Arterialized blood is noted in the distal transverse and sigmoid sinus, with high signal on the MRA, and enlarged feeding arteries, which appear to be branches of the right external carotid artery, in particular the occipital artery and middle meningeal artery. No evidence of jugular bulb diverticulum or high-riding jugular bulb, sigmoid sinus diverticulum, or mastoid emissary veins. MRA NECK FINDINGS Aortic arch: Three-vessel arch.  No stenosis or dissection. Right carotid system: No evidence of dissection, occlusion or hemodynamically significant stenosis (greater than 50%). Left carotid system: No evidence of dissection, occlusion or hemodynamically significant stenosis (greater than 50%). Vertebral arteries: No evidence of dissection, occlusion or hemodynamically significant stenosis (greater than 50%). Other: None IMPRESSION: 1. 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 Neuro-Interventional Radiology consultation is recommended. 2. No other acute intracranial process. 3.  No intracranial large vessel occlusion or significant  stenosis. 4.  No hemodynamically significant stenosis in the neck. These results will be called to the ordering clinician or representative by the Radiologist Assistant, and communication documented in the PACS or [employer]. Electronically Signed   By: Wiliam KeAlison  Vasan M.D.   On: 09/10/2021 16:05   MR BRAIN W WO CONTRAST  Result Date: 09/10/2021 CLINICAL DATA:  Pulsatile tinnitus of right ear EXAM: MRI HEAD WITHOUT AND WITH CONTRAST MRA HEAD WITHOUT CONTRAST MRA NECK WITHOUT AND WITH CONTRAST TECHNIQUE: Multiplanar, multi-echo pulse sequences of the brain and surrounding structures were acquired without and with intravenous contrast. Angiographic images of the Circle of Willis were acquired using MRA technique without intravenous contrast. Angiographic images of the neck were acquired using MRA technique without and with intravenous contrast. Carotid stenosis measurements (when applicable) are obtained utilizing NASCET criteria, using the distal internal carotid diameter as the denominator. CONTRAST:  9mL MULTIHANCE GADOBENATE DIMEGLUMINE 529 MG/ML IV SOLN COMPARISON:  None FINDINGS: MRI HEAD FINDINGS Brain: No restricted diffusion to suggest acute or subacute infarct. No acute hemorrhage, mass, mass effect, or midline shift. No hydrocephalus or extra-axial collection. Vascular: Normal flow voids. Skull and upper cervical spine: Normal marrow signal.  Sinuses/Orbits: Mucosal thickening in the maxillary sinuses and ethmoid air cells. Otherwise negative. The mastoids are well aerated. Cranial Nerves VII and VIII: Normal bilaterally, without evidence of mass or abnormal enhancement. Cochleae: Normal bilaterally. Semicircular Canals: Normal bilaterally. Porus Acusticus: Normal bilaterally. Cerebellopontine Angle: Normal bilaterally, without evidence of mass. Vestibular Aqueducts: Normal bilaterally. MRA HEAD FINDINGS Anterior circulation: Both internal carotid arteries are patent to the termini, without stenosis  or other abnormality. No evidence of aberrant internal carotid artery. A1 segments patent. Normal anterior communicating artery. Anterior cerebral arteries are patent to their distal aspects. No M1 stenosis or occlusion. Normal MCA bifurcations. Distal MCA branches perfused and symmetric. Posterior circulation: Vertebral arteries patent to the vertebrobasilar junction without stenosis. Posterior inferior cerebral arteries patent bilaterally. Basilar patent to its distal aspect. Superior cerebellar arteries patent bilaterally. Bilateral P1 segments originate from the basilar artery. PCAs perfused to their distal aspects without stenosis. The posterior communicating arteries are not visualized. Anatomic variants: In the right posterior fossa, there is a dural arteriovenous fistula (series 3, images 70 and series 6, image 1). Arterialized blood is noted in the distal transverse and sigmoid sinus, with high signal on the MRA, and enlarged feeding arteries, which appear to be branches of the right external carotid artery, in particular the occipital artery and middle meningeal artery. No evidence of jugular bulb diverticulum or high-riding jugular bulb, sigmoid sinus diverticulum, or mastoid emissary veins. MRA NECK FINDINGS Aortic arch: Three-vessel arch.  No stenosis or dissection. Right carotid system: No evidence of dissection, occlusion or hemodynamically significant stenosis (greater than 50%). Left carotid system: No evidence of dissection, occlusion or hemodynamically significant stenosis (greater than 50%). Vertebral arteries: No evidence of dissection, occlusion or hemodynamically significant stenosis (greater than 50%). Other: None IMPRESSION: 1. 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  Neuro-Interventional Radiology consultation is recommended. 2. No other acute intracranial process. 3.  No intracranial large vessel occlusion or significant stenosis. 4.  No hemodynamically significant stenosis in the neck. These results will be called to the ordering clinician or representative by the Radiologist Assistant, and communication documented in the PACS or [employer]. Electronically Signed   By: Wiliam Ke M.D.   On: 09/10/2021 16:05   US Carotid Bilateral  Result Date: 09/10/2021 CLINICAL DATA:  Pulsatile tinnitus EXAM: BILATERAL CAROTID DUPLEX ULTRASOUND TECHNIQUE: Wallace Cullens scale imaging, color Doppler and duplex ultrasound were performed of bilateral carotid and vertebral arteries in the neck. COMPARISON:  None. FINDINGS: Criteria: Quantification of carotid stenosis is based on velocity parameters that correlate the residual internal carotid diameter with NASCET-based stenosis levels, using the diameter of the distal internal carotid lumen as the denominator for stenosis measurement. The following velocity measurements were obtained: RIGHT ICA: 75/31 cm/sec CCA: 97/43 cm/sec SYSTOLIC ICA/CCA RATIO:  0.8 ECA: 109 cm/sec LEFT ICA: 82/30 cm/sec CCA: 70/14 cm/sec SYSTOLIC ICA/CCA RATIO:  1.2 ECA: 44 cm/sec RIGHT CAROTID ARTERY: No significant atheromatous plaque. RIGHT VERTEBRAL ARTERY:  Antegrade flow. LEFT CAROTID ARTERY:  No significant atheromatous plaque. LEFT VERTEBRAL ARTERY:  Antegrade flow. IMPRESSION: No significant stenosis of internal carotid arteries. Electronically Signed   By: Acquanetta Belling M.D.   On: 09/10/2021 13:45    Labs:  CBC: Recent Labs    09/24/21 1124  WBC 5.0  HGB 15.1*  HCT 47.1*  PLT 152  COAGS: Recent Labs    09/24/21 1220  INR 1.1    BMP: Recent Labs    09/24/21 1124  NA 138  K 4.2  CL 101  CO2 28  GLUCOSE 90  BUN 10  CALCIUM 9.7  CREATININE 0.67  GFRNONAA >60    LIVER FUNCTION TESTS: No results for input(s): BILITOT, AST, ALT,  ALKPHOS, PROT, ALBUMIN in the last 8760 hours.  TUMOR MARKERS: No results for input(s): AFPTM, CEA, CA199, CHROMGRNA in the last 8760 hours.  Assessment and Plan: Patient with past medical history of chronic headaches, IBS presents with complaint of tinnitus.  IR consulted for diagnostic cerebral angiogram. Case reviewed by Dr. Tommie Sams who approves patient for procedure.  Patient presents today in their usual state of health.  She has been NPO and is not currently on blood thinners.   Risks and benefits were discussed with the patient including, but not limited to bleeding, infection, vascular injury or contrast induced renal failure.  This interventional procedure involves the use of X-rays and because of the nature of the planned procedure, it is possible that we will have prolonged use of X-ray fluoroscopy.  Potential radiation risks to you include (but are not limited to) the following: - A slightly elevated risk for cancer  several years later in life. This risk is typically less than 0.5% percent. This risk is low in comparison to the normal incidence of human cancer, which is 33% for women and 50% for men according to the [employer] Society. - Radiation induced injury can include skin redness, resembling a rash, tissue breakdown / ulcers and hair loss (which can be temporary or permanent).   The likelihood of either of these occurring depends on the difficulty of the procedure and whether you are sensitive to radiation due to previous procedures, disease, or genetic conditions.   IF your procedure requires a prolonged use of radiation, you will be notified and given written instructions for further action.  It is your responsibility to monitor the irradiated area for the 2 weeks following the procedure and to notify your physician if you are concerned that you have suffered a radiation induced injury.    All of the patient's questions were answered, patient is  agreeable to proceed.  Consent signed and in chart.  Thank you for this interesting consult.  I greatly enjoyed meeting Shadaya Marschner and look forward to participating in their care.  A copy of this report was sent to the requesting provider on this date.  Electronically Signed: Hoyt Koch, PA 09/24/2021, 1:39 PM   I spent a total of  30 Minutes   in face to face in clinical consultation, greater than 50% of which was counseling/coordinating care for pulsatile tinnitus.

## 2021-09-24 NOTE — H&P (Incomplete)
The patient has had a H&P performed within the last 30 days, all history, medications, and exam have been reviewed. The patient denies any interval changes since the H&P.  ***

## 2021-09-24 NOTE — Procedures (Signed)
INTERVENTIONAL NEURORADIOLOGY BRIEF POSTPROCEDURE NOTE  DIAGNOSTIC CEREBRAL ANGIOGRAM    Attending: Dr. Jerilynn Mages de Melchor Amour   Assistant: None.    Diagnosis: Right transverse sinus dural AV fistula.    Access site: Right radial artery.    Access closure: TR band.    Anesthesia: Moderate sedation.    Medication used: 1 mg Versed IV; 25 mcg Fentanyl IV.   Complications: None.    Estimated blood loss: Negligible.    Specimen: None.    Findings: A right transverse sinus dural AV fistula supplied primarily by the right occipital artery and right middle meningeal artery.  No other significant vascular abnormality seen.    The patient tolerated the procedure well without incident or complication and is in stable condition.   Patient will return for consultation to discuss fistula management.

## 2021-09-28 ENCOUNTER — Other Ambulatory Visit (HOSPITAL_COMMUNITY): Payer: Self-pay | Admitting: Neuroradiology

## 2021-09-28 DIAGNOSIS — H93A1 Pulsatile tinnitus, right ear: Secondary | ICD-10-CM

## 2021-09-28 DIAGNOSIS — I770 Arteriovenous fistula, acquired: Secondary | ICD-10-CM

## 2021-10-06 ENCOUNTER — Other Ambulatory Visit (HOSPITAL_COMMUNITY): Payer: Self-pay | Admitting: Neuroradiology

## 2021-10-06 DIAGNOSIS — I671 Cerebral aneurysm, nonruptured: Secondary | ICD-10-CM

## 2021-10-11 ENCOUNTER — Other Ambulatory Visit: Payer: Self-pay | Admitting: Neuroradiology

## 2021-10-11 ENCOUNTER — Ambulatory Visit (HOSPITAL_COMMUNITY)
Admission: RE | Admit: 2021-10-11 | Discharge: 2021-10-11 | Disposition: A | Discharge status: Home / Self Care | Payer: BC Managed Care – PPO | Source: Ambulatory Visit | Attending: Neuroradiology | Admitting: Neuroradiology

## 2021-10-11 ENCOUNTER — Other Ambulatory Visit: Payer: Self-pay

## 2021-10-11 DIAGNOSIS — I770 Arteriovenous fistula, acquired: Secondary | ICD-10-CM

## 2021-10-11 DIAGNOSIS — H93A1 Pulsatile tinnitus, right ear: Secondary | ICD-10-CM

## 2021-10-11 LAB — SARS CORONAVIRUS 2 (TAT 6-24 HRS): SARS Coronavirus 2: NEGATIVE

## 2021-10-11 NOTE — Progress Notes (Signed)
Referring Physician(s): Scot Jun, PA-C.  Chief Complaint: The patient is seen in follow up today s/p diagnostic cerebral angiogram to evaluate for dural AV fistula.  History of present illness:  No significant interval change in clinical history and presentation. In summary, Cassandra Jefferson is a 41 y.o. female with a past medical history of  chronic headaches, IBS and thyroid disease presenting with right-sided pulsatile tinnitus which has significantly interfered with her quality of life.  Audiological evaluation is within normal limits.  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 Venous drainage remains anterograde with no evidence of cortical venous reflux.  Physiological venous drainage via left transverse and sigmoid sinus as well as bilateral sphenoparietal sinuses.  The fistula does not appear to involve the confluence of the right vein of [place].  Past Medical History:  Diagnosis Date   Chronic headaches    IBS (irritable bowel syndrome)    Thyroid disease     Past Surgical History:  Procedure Laterality Date   DIAGNOSTIC LAPAROSCOPY     IR ANGIO EXTERNAL CAROTID SEL EXT CAROTID BILAT MOD SED  09/24/2021   IR ANGIO INTRA EXTRACRAN SEL INTERNAL CAROTID BILAT MOD SED  09/24/2021   IR ANGIO VERTEBRAL SEL VERTEBRAL BILAT MOD SED  09/24/2021   IR US GUIDE VASC ACCESS RIGHT  09/24/2021   wisom teeth      Allergies: Erythromycin and Penicillins  Medications: Prior to Admission medications   Medication Sig Start Date End Date Taking? Authorizing Provider  albuterol (VENTOLIN HFA)  108 (90 Base) MCG/ACT inhaler Inhale 2 puffs into the lungs every 6 (six) hours as needed for wheezing or shortness of breath.    [provider]  ALPRAZolam Prudy Feeler) 0.5 MG tablet Take 0.5 mg by mouth 3 (three) times daily as needed for anxiety.    [provider]  cetirizine (ZYRTEC) 10 MG tablet Take 10 mg by mouth daily as needed for allergies.    [provider]  cholecalciferol (VITAMIN D3) 25 MCG (1000 UNIT) tablet Take 1,000 Units by mouth daily.    [provider]  cyproheptadine (PERIACTIN) 4 MG tablet Take 4 mg by mouth at bedtime as needed for allergies. 09/29/15   [provider]  ibuprofen (ADVIL) 200 MG tablet Take 600 mg by mouth every 6 (six) hours as needed for moderate pain or headache.    [provider]  levonorgestrel (MIRENA) 20 MCG/24HR IUD 1 each by Intrauterine route once.      [provider]  MELATONIN-MAGNESIUM CITRATE PO Take 1 Dose by mouth at bedtime as needed (sleep). 1 dose = 2 teaspoons    [provider]  thyroid (ARMOUR) 120 MG tablet Take 120 mg by mouth daily.    [provider]  tretinoin (RETIN-A) 0.025 % cream Apply 1 application topically at bedtime.    [provider]  valACYclovir (VALTREX) 1000 MG tablet Take 1,000 mg by mouth 2 (two) times daily as needed (fever blisters).    [provider]     Family History  Problem Relation Age of Onset   Clotting disorder Mother    Irritable bowel syndrome Mother  Diabetes Sister    Irritable bowel syndrome Sister    Prostate cancer Maternal Uncle    Heart disease Maternal Uncle    Diabetes Maternal Grandmother    Heart disease Maternal Grandmother    Stomach cancer Maternal Grandfather    Colon cancer Maternal Grandfather    Heart disease Maternal Grandfather    Ovarian cancer Paternal Grandmother    Melanoma Father     Social History   Socioeconomic History   Marital status: Married    Spouse name:  Not on file   Number of children: 0   Years of education: Not on file   Highest education level: Not on file  Occupational History   Occupation: Chief Executive Officer: [employer]  Tobacco Use   Smoking status: Never   Smokeless tobacco: Never  Substance and Sexual Activity   Alcohol use: Yes    Comment: occ   Drug use: No   Sexual activity: Not on file  Other Topics Concern   Not on file  Social History Narrative   Not on file   Social Determinants of Health   Financial Resource Strain: Not on file  Food Insecurity: Not on file  Transportation Needs: Not on file  Physical Activity: Not on file  Stress: Not on file  Social Connections: Not on file     Vital Signs: There were no vitals taken for this visit.  Physical Exam  Imaging: EXAM: ULTRASOUND-GUIDED VASCULAR ACCESS DIAGNOSTIC CEREBRAL ANGIOGRAM COMPARISON:  MRI/MR angiogram of the head and neck September 10, 2021. MEDICATIONS: 5,000 IU heparin, 5 mg Verapamil and 400 mcg nitroglycerin ANESTHESIA/SEDATION: Moderate (conscious) sedation was employed during this procedure. A total of Versed 1 mg and Fentanyl 25 mcg was administered intravenously by the radiology nurse. Total intra-service moderate Sedation Time: 67 minutes. The patient's level of consciousness and vital signs were monitored continuously by radiology nursing throughout the procedure under my direct supervision. CONTRAST:  90 mL of Omnipaque 300 milligram/mL FLUOROSCOPY TIME:  Fluoroscopy Time: 14 minutes 36 seconds (781 mGy). COMPLICATIONS: None immediate. FINDINGS: Right radial artery ultrasound and right radial artery angiogram: The caliber of the distal right radial artery is appropriate for angiogram access. The right radial artery and the right ulnar artery have normal course and caliber. No significant anatomical variants noted. Right vertebral artery angiograms: The right vertebral artery, basilar artery, and bilateral  posterior cerebral arteries are unremarkable. Luminal caliber is smooth and tapering. No aneurysms are seen. 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 Visualized dural sinuses are patent. Left CCA angiograms: Cervical angiograms show normal course and caliber of the visualized left common carotid and internal carotid arteries. There are no significant stenoses. Left ICA angiograms: There is brisk vascular contrast filling of the left ACA and MCA vascular trees. Luminal caliber is smooth and tapering. No aneurysms or abnormally high-flow, early draining veins are seen. No regions of abnormal hypervascularity are noted. No venous drainage in the to the right transverse or sigmoid sinus. The left transverse and sigmoid sinuses are widely patent. Left ECA angiograms: No early venous drainage was noted. The visualized branches of the left external carotid artery are unremarkable. Left vertebral artery angiograms: The left vertebral artery, basilar artery, and bilateral posterior cerebral arteries are unremarkable. Luminal caliber is smooth and tapering. No aneurysms are seen. Right transverse/sigmoid sinus dural AV fistula in supplied by right posterior meningeal artery as well as right AICA and SCA  branches. Normal venous drainage is for [place] through the left transverse and sigmoid sinus. Right CCA angiograms: Cervical angiograms show normal course and caliber of the visualized right common carotid and internal carotid arteries. There are no significant stenoses. Right ICA angiograms: There is brisk vascular contrast filling of the right ACA and MCA vascular trees. Luminal caliber is smooth and tapering. No aneurysms are seen. No regions of abnormal hypervascularity are noted. Faint early opacification of the right transverse sinus is seen via prominent artery of Bern[place]coni and [place]in[place]. Preferential interior drainage  into the sphenoid parietal sinus and posteriorly into the left transverse and sigmoid sinus. Right ECA angiograms: Prominent dural AV fistula seen in the left transverse/sigmoid sinus supplied by branches of the right middle meningeal artery, posterior ring level artery and occipital artery. The venous drainage is anterograde. No evidence of cortical venous reflux. 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 Venous drainage remains anterograde with no evidence of cortical venous reflux. Physiological venous drainage via left transverse and sigmoid sinus as well as bilateral sphenoparietal sinuses. Electronically Signed   By: Baldemar Lenis M.D.   On: 09/24/2021 17:23  Labs:  CBC: Recent Labs    09/24/21 1124  WBC 5.0  HGB 15.1*  HCT 47.1*  PLT 152    COAGS: Recent Labs    09/24/21 1220  INR 1.1    BMP: Recent Labs    09/24/21 1124  NA 138  K 4.2  CL 101  CO2 28  GLUCOSE 90  BUN 10  CALCIUM 9.7  CREATININE 0.67  GFRNONAA >60    LIVER FUNCTION TESTS: No results for input(s): BILITOT, AST, ALT, ALKPHOS, PROT, ALBUMIN in the last 8760 hours.  Assessment:  Imaging findings and treatment options were discussed with the patient.  Feeding artery embolization with liquid embolic agent may be performed.  However, given numerous feeders to the right transverse/sigmoid sinus fistula, sinus occlusion may be necessary.  Physiologic brain drainage is not seen through the right transverse/sigmoid sinus with some venous reflux seen into the left transverse sinus.  Endovascular treatment would be performed with combined arterial and venous approach.  Patient is in agreement with the plan.  All questions were answered to her satisfaction.  Her procedure is scheduled for Wednesday,  October 13, 2021.   Signed: Baldemar Lenis, MD 10/11/2021, 3:47 PM    I spent a total of    40 Minutes in face to face in clinical consultation, greater than 50% of which was counseling/coordinating care for right transverse sinus dural AV fistula.

## 2021-10-12 ENCOUNTER — Other Ambulatory Visit: Payer: Self-pay

## 2021-10-12 ENCOUNTER — Other Ambulatory Visit: Payer: Self-pay | Admitting: Radiology

## 2021-10-12 ENCOUNTER — Encounter (HOSPITAL_COMMUNITY): Payer: Self-pay | Admitting: *Deleted

## 2021-10-12 NOTE — Progress Notes (Signed)
Cassandra Jefferson denies chest pain or shortness of breath.  Patient denies having any s/s of Covid in her household.  Patient denies any known exposure to Covid.   I instructed Cassandra. Quashie to shower with antibiotic soap, if it is available.  Dry off with a clean towel. Do not put lotion, powder, cologne or deodorant or makeup.No jewelry or piercings. Men may shave their face and neck. Woman should not shave. No nail polish, artificial or acrylic nails. Wear clean clothes, brush your teeth. Glasses, contact lens,dentures or partials may not be worn in the OR. If you need to wear them, please bring a case for glasses, do not wear contacts or bring a case, the hospital does not have contact cases, dentures or partials will have to be removed , make sure they are clean, we will provide a denture cup to put them in. You will need some one to drive you home and a responsible person over the age of 93 to stay with you for the first 24 hours after surgery.

## 2021-10-12 NOTE — H&P (Signed)
Chief Complaint: Patient was seen in consultation today for dural AV fistula   Referring Physician(s): Lemitar  Supervising Physician: Pedro Earls  Patient Status: [facility] - Out-pt  History of Present Illness: Cassandra Jefferson is a 41 y.o. female with a medical history significant for asthma, chronic headaches, IBS and pulsatile tinnitus. She was recently seen in consultation with Dr. Karenann Cai for further evaluation of pulsatile tinnitus and underwent a diagnostic cerebral angiogram 09/24/21. 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 Karenann Cai 10/11/21 to discuss possible treatment options. Endovascular treatment under general anesthesia with planned overnight observation was proposed and the patient was in agreement to proceed.   Past Medical History:  Diagnosis Date   Asthma    Chronic headaches    Family history of adverse reaction to anesthesia    Mothe r- "maybe diffiuculty waking up after taking   Hypothyroidism    IBS (irritable bowel syndrome)    10/12/21 -not an issue now.   Thyroid disease     Past Surgical History:  Procedure Laterality Date   DIAGNOSTIC LAPAROSCOPY     IR ANGIO EXTERNAL CAROTID SEL EXT CAROTID BILAT MOD SED  09/24/2021   IR ANGIO INTRA EXTRACRAN SEL INTERNAL CAROTID BILAT MOD SED  09/24/2021   IR ANGIO VERTEBRAL SEL VERTEBRAL BILAT MOD SED  09/24/2021   IR US GUIDE VASC ACCESS RIGHT  09/24/2021   wisom teeth      Allergies: Erythromycin and Penicillins  Medications: Prior to Admission medications   Medication Sig Start Date End Date Taking? Authorizing Provider  albuterol (VENTOLIN HFA) 108 (90 Base) MCG/ACT inhaler Inhale 2 puffs into the lungs every 6 (six) hours as needed for wheezing or shortness of breath.    [provider]  ALPRAZolam Duanne Moron) 0.5 MG tablet Take 0.5 mg by mouth 3 (three) times daily as needed for  anxiety.    [provider]  cetirizine (ZYRTEC) 10 MG tablet Take 10 mg by mouth daily as needed for allergies.    [provider]  cholecalciferol (VITAMIN D3) 25 MCG (1000 UNIT) tablet Take 1,000 Units by mouth daily.    [provider]  cyproheptadine (PERIACTIN) 4 MG tablet Take 4 mg by mouth at bedtime as needed for allergies. 09/29/15   [provider]  ibuprofen (ADVIL) 200 MG tablet Take 600 mg by mouth every 6 (six) hours as needed for moderate pain or headache.    [provider]  levonorgestrel (MIRENA) 20 MCG/24HR IUD 1 each by Intrauterine route once.      [provider]  MELATONIN-MAGNESIUM CITRATE PO Take 1 Dose by mouth at bedtime as needed (sleep). 1 dose = 2 teaspoons    [provider]  thyroid (ARMOUR) 120 MG tablet Take 120 mg by mouth daily.    [provider]  tretinoin (RETIN-A) 0.025 % cream Apply 1 application topically at bedtime.    [provider]  valACYclovir (VALTREX) 1000 MG tablet Take 1,000 mg by mouth 2 (two) times daily as needed (fever blisters).    [provider]     Family History  Problem Relation Age of Onset   Clotting disorder Mother    Irritable bowel syndrome Mother    Diabetes Sister    Irritable bowel syndrome Sister    Prostate cancer Maternal Uncle    Heart disease Maternal Uncle    Diabetes Maternal Grandmother  Heart disease Maternal Grandmother    Stomach cancer Maternal Grandfather    Colon cancer Maternal Grandfather    Heart disease Maternal Grandfather    Ovarian cancer Paternal Grandmother    Melanoma Father     Social History   Socioeconomic History   Marital status: Married    Spouse name: Not on file   Number of children: 0   Years of education: Not on file   Highest education level: Not on file  Occupational History   Occupation: Marine scientist: [employer]  Tobacco Use   Smoking status: Never    Smokeless tobacco: Never  Vaping Use   Vaping Use: Never used  Substance and Sexual Activity   Alcohol use: Yes    Alcohol/week: 7.0 standard drinks    Types: 5 Glasses of wine, 2 Cans of beer per week   Drug use: No   Sexual activity: Not Currently  Other Topics Concern   Not on file  Social History Narrative   Not on file   Social Determinants of Health   Financial Resource Strain: Not on file  Food Insecurity: Not on file  Transportation Needs: Not on file  Physical Activity: Not on file  Stress: Not on file  Social Connections: Not on file    Review of Systems: A 12 point ROS discussed and pertinent positives are indicated in the HPI above.  All other systems are negative.  Review of Systems  Constitutional:  Negative for appetite change and fatigue.  HENT:  Positive for tinnitus.   Respiratory:  Negative for cough and shortness of breath.   Cardiovascular:  Negative for chest pain and leg swelling.  Gastrointestinal:  Negative for abdominal pain, diarrhea, nausea and vomiting.  Neurological:  Positive for headaches. Negative for dizziness and weakness.   Vital Signs: Temp: 98.3  BP 137/93  HR 81  RR 16  100% RA  Physical Exam Constitutional:      General: She is not in acute distress.    Appearance: She is not ill-appearing.  Cardiovascular:     Rate and Rhythm: Normal rate and regular rhythm.     Pulses: Normal pulses.     Heart sounds: Normal heart sounds.  Pulmonary:     Effort: Pulmonary effort is normal.     Breath sounds: Normal breath sounds.  Abdominal:     General: Bowel sounds are normal.     Palpations: Abdomen is soft.  Musculoskeletal:        General: Normal range of motion.     Right lower leg: No edema.     Left lower leg: No edema.  Skin:    General: Skin is warm and dry.  Neurological:     Mental Status: She is alert and oriented to person, place, and time.     Cranial Nerves: No dysarthria or facial asymmetry.     Motor: No weakness.   Psychiatric:        Mood and Affect: Mood normal.        Behavior: Behavior normal.        Thought Content: Thought content normal.        Judgment: Judgment normal.    Imaging: IR US Guide Vasc Access Right  Result Date: 09/24/2021 INDICATION: 41 year old female with past medical history significant for chronic headache, IBS and thyroid disease presenting with pulsatile tinnitus. MR angiogram performed September 10, 2021 was suggestive of a right transverse sinus dural AV fistula. She comes  to our service today for a diagnostic cerebral angiogram to evaluate MRI findings. EXAM: ULTRASOUND-GUIDED VASCULAR ACCESS DIAGNOSTIC CEREBRAL ANGIOGRAM COMPARISON:  MRI/MR angiogram of the head and neck September 10, 2021. MEDICATIONS: 5,000 IU heparin, 5 mg Verapamil and 400 mcg nitroglycerin ANESTHESIA/SEDATION: Moderate (conscious) sedation was employed during this procedure. A total of Versed 1 mg and Fentanyl 25 mcg was administered intravenously by the radiology nurse. Total intra-service moderate Sedation Time: 67 minutes. The patient's level of consciousness and vital signs were monitored continuously by radiology nursing throughout the procedure under my direct supervision. CONTRAST:  90 mL of Omnipaque 300 milligram/mL FLUOROSCOPY TIME:  Fluoroscopy Time: 14 minutes 36 seconds (781 mGy). COMPLICATIONS: None immediate. TECHNIQUE: Informed written consent was obtained from the patient after a thorough discussion of the procedural risks, benefits and alternatives. All questions were addressed. Maximal Sterile Barrier Technique was utilized including caps, mask, sterile gowns, sterile gloves, sterile drape, hand hygiene and skin antiseptic. A timeout was performed prior to the initiation of the procedure. Using the modified Seldinger technique and a micropuncture kit, access was gained to the right radial artery at the wrist and a 5 French sheath was placed. Real-time ultrasound guidance was utilized for vascular  access including the acquisition of a permanent ultrasound image documenting patency of the accessed vessel. Slow intra arterial infusion of 5,000 IU heparin, 5 mg Verapamil and 200 mcg nitroglycerin diluted in patient's own blood was performed. No significant fluctuation in patient's blood pressure seen. Then, a right radial artery angiogram was obtained via sheath side port. Normal brachial artery branching pattern seen. No significant anatomical variation. The right radial artery caliber is adequate for vascular access. Next, a 5 [place] Simmons 2 glide catheter was navigated over a 0.035" Terumo Glidewire into the right subclavian artery under fluoroscopic guidance. Using roadmap guidance, the catheter was advanced into the right vertebral artery. Frontal and lateral angiograms of the head were obtained followed by magnified right anterior oblique and magnified lateral views of the skull base. The catheter was then navigated into the aortic arch where the catheter tip was reformed. The catheter was then placed into the left common carotid artery. Frontal and lateral angiograms of the neck were obtained. Using biplane roadmap, the catheter was navigated into the left internal carotid artery. Frontal and lateral angiograms of the head were obtained. The catheter was then retracted into the left common carotid artery. Using biplane roadmap, the catheter was advanced into the left external carotid artery. Frontal and lateral angiograms of the head were obtained. Next, the catheter was placed into the left subclavian artery. Using roadmap guidance, the catheter was placed into the left vertebral artery. Frontal and lateral angiograms of the head were obtained. The catheter was then placed into the right common carotid artery. Frontal and lateral angiograms of the neck were obtained. Using biplane roadmap, the catheter was advanced into the right internal carotid artery. Frontal and lateral angiograms of the head were  obtained. The catheter was retracted into the right common carotid artery and under biplane roadmap the catheter was advanced into the right internal maxillary artery. Frontal, lateral and bilateral magnified oblique views of the head obtained. Then, the catheter was retracted into the proximal right external carotid artery. Frontal and lateral angiograms of the head were obtained. The catheter was then retracted to the level of the right occipital artery. Frontal and lateral angiograms of the head were obtained. The catheter was retracted into the mid right radial artery. 200 mcg of nitroglycerin  was injected 4 vasospasm treatment. The catheter was subsequently withdrawn. An inflatable band was placed and inflated over the right wrist access site. The vascular sheath was withdrawn and the band was slowly deflated until brisk flow was noted through the arteriotomy site. At this point, the band was reinflated with additional 3 cc of air to obtain patent hemostasis. FINDINGS: Right radial artery ultrasound and right radial artery angiogram: The caliber of the distal right radial artery is appropriate for angiogram access. The right radial artery and the right ulnar artery have normal course and caliber. No significant anatomical variants noted. Right vertebral artery angiograms: The right vertebral artery, basilar artery, and bilateral posterior cerebral arteries are unremarkable. Luminal caliber is smooth and tapering. No aneurysms are seen. 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 Visualized dural sinuses are patent. Left CCA angiograms: Cervical angiograms show normal course and caliber of the visualized left common carotid and internal carotid arteries. There are no significant stenoses. Left ICA angiograms: There is brisk vascular contrast filling of the left ACA and MCA vascular trees. Luminal caliber is smooth and tapering. No aneurysms or  abnormally high-flow, early draining veins are seen. No regions of abnormal hypervascularity are noted. No venous drainage in the to the right transverse or sigmoid sinus. The left transverse and sigmoid sinuses are widely patent. Left ECA angiograms: No early venous drainage was noted. The visualized branches of the left external carotid artery are unremarkable. Left vertebral artery angiograms: The left vertebral artery, basilar artery, and bilateral posterior cerebral arteries are unremarkable. Luminal caliber is smooth and tapering. No aneurysms are seen. Right transverse/sigmoid sinus dural AV fistula in supplied by right posterior meningeal artery as well as right AICA and SCA branches. Normal venous drainage is for [place] through the left transverse and sigmoid sinus. Right CCA angiograms: Cervical angiograms show normal course and caliber of the visualized right common carotid and internal carotid arteries. There are no significant stenoses. Right ICA angiograms: There is brisk vascular contrast filling of the right ACA and MCA vascular trees. Luminal caliber is smooth and tapering. No aneurysms are seen. No regions of abnormal hypervascularity are noted. Faint early opacification of the right transverse sinus is seen via prominent artery of Bernasconi and Cassinari. Preferential interior drainage into the sphenoid parietal sinus and posteriorly into the left transverse and sigmoid sinus. Right ECA angiograms: Prominent dural AV fistula seen in the left transverse/sigmoid sinus supplied by branches of the right middle meningeal artery, posterior ring level artery and occipital artery. The venous drainage is anterograde. No evidence of cortical venous reflux. PROCEDURE: No intervention performed. 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 Venous drainage remains anterograde with no evidence of cortical venous reflux. Physiological venous drainage via left transverse and sigmoid sinus as well as bilateral sphenoparietal sinuses. PLAN: Patient will return for office consultation to discuss dural AV fistula management. Electronically Signed   By: Pedro Earls M.D.   On: 09/24/2021 17:23   IR ANGIO INTRA EXTRACRAN SEL INTERNAL CAROTID BILAT MOD SED  Result Date: 09/24/2021 INDICATION: 41 year old female with past medical history significant for chronic headache, IBS and thyroid disease presenting with pulsatile tinnitus. MR angiogram performed September 10, 2021 was suggestive of a right transverse sinus dural AV fistula. She comes to our service today  for a diagnostic cerebral angiogram to evaluate MRI findings. EXAM: ULTRASOUND-GUIDED VASCULAR ACCESS DIAGNOSTIC CEREBRAL ANGIOGRAM COMPARISON:  MRI/MR angiogram of the head and neck September 10, 2021. MEDICATIONS: 5,000 IU heparin, 5 mg Verapamil and 400 mcg nitroglycerin ANESTHESIA/SEDATION: Moderate (conscious) sedation was employed during this procedure. A total of Versed 1 mg and Fentanyl 25 mcg was administered intravenously by the radiology nurse. Total intra-service moderate Sedation Time: 67 minutes. The patient's level of consciousness and vital signs were monitored continuously by radiology nursing throughout the procedure under my direct supervision. CONTRAST:  90 mL of Omnipaque 300 milligram/mL FLUOROSCOPY TIME:  Fluoroscopy Time: 14 minutes 36 seconds (781 mGy). COMPLICATIONS: None immediate. TECHNIQUE: Informed written consent was obtained from the patient after a thorough discussion of the procedural risks, benefits and alternatives. All questions were addressed. Maximal Sterile Barrier Technique was utilized including caps, mask, sterile gowns, sterile gloves, sterile drape, hand hygiene and skin antiseptic. A timeout was performed prior to the initiation of the  procedure. Using the modified Seldinger technique and a micropuncture kit, access was gained to the right radial artery at the wrist and a 5 French sheath was placed. Real-time ultrasound guidance was utilized for vascular access including the acquisition of a permanent ultrasound image documenting patency of the accessed vessel. Slow intra arterial infusion of 5,000 IU heparin, 5 mg Verapamil and 200 mcg nitroglycerin diluted in patient's own blood was performed. No significant fluctuation in patient's blood pressure seen. Then, a right radial artery angiogram was obtained via sheath side port. Normal brachial artery branching pattern seen. No significant anatomical variation. The right radial artery caliber is adequate for vascular access. Next, a 5 [place] Simmons 2 glide catheter was navigated over a 0.035" Terumo Glidewire into the right subclavian artery under fluoroscopic guidance. Using roadmap guidance, the catheter was advanced into the right vertebral artery. Frontal and lateral angiograms of the head were obtained followed by magnified right anterior oblique and magnified lateral views of the skull base. The catheter was then navigated into the aortic arch where the catheter tip was reformed. The catheter was then placed into the left common carotid artery. Frontal and lateral angiograms of the neck were obtained. Using biplane roadmap, the catheter was navigated into the left internal carotid artery. Frontal and lateral angiograms of the head were obtained. The catheter was then retracted into the left common carotid artery. Using biplane roadmap, the catheter was advanced into the left external carotid artery. Frontal and lateral angiograms of the head were obtained. Next, the catheter was placed into the left subclavian artery. Using roadmap guidance, the catheter was placed into the left vertebral artery. Frontal and lateral angiograms of the head were obtained. The catheter was then placed into the  right common carotid artery. Frontal and lateral angiograms of the neck were obtained. Using biplane roadmap, the catheter was advanced into the right internal carotid artery. Frontal and lateral angiograms of the head were obtained. The catheter was retracted into the right common carotid artery and under biplane roadmap the catheter was advanced into the right internal maxillary artery. Frontal, lateral and bilateral magnified oblique views of the head obtained. Then, the catheter was retracted into the proximal right external carotid artery. Frontal and lateral angiograms of the head were obtained. The catheter was then retracted to the level of the right occipital artery. Frontal and lateral angiograms of the head were obtained. The catheter was retracted into the mid right radial artery. 200 mcg of nitroglycerin was injected 4 vasospasm  treatment. The catheter was subsequently withdrawn. An inflatable band was placed and inflated over the right wrist access site. The vascular sheath was withdrawn and the band was slowly deflated until brisk flow was noted through the arteriotomy site. At this point, the band was reinflated with additional 3 cc of air to obtain patent hemostasis. FINDINGS: Right radial artery ultrasound and right radial artery angiogram: The caliber of the distal right radial artery is appropriate for angiogram access. The right radial artery and the right ulnar artery have normal course and caliber. No significant anatomical variants noted. Right vertebral artery angiograms: The right vertebral artery, basilar artery, and bilateral posterior cerebral arteries are unremarkable. Luminal caliber is smooth and tapering. No aneurysms are seen. 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 Visualized dural sinuses are patent. Left CCA angiograms: Cervical angiograms show normal course and caliber of the visualized left common carotid and  internal carotid arteries. There are no significant stenoses. Left ICA angiograms: There is brisk vascular contrast filling of the left ACA and MCA vascular trees. Luminal caliber is smooth and tapering. No aneurysms or abnormally high-flow, early draining veins are seen. No regions of abnormal hypervascularity are noted. No venous drainage in the to the right transverse or sigmoid sinus. The left transverse and sigmoid sinuses are widely patent. Left ECA angiograms: No early venous drainage was noted. The visualized branches of the left external carotid artery are unremarkable. Left vertebral artery angiograms: The left vertebral artery, basilar artery, and bilateral posterior cerebral arteries are unremarkable. Luminal caliber is smooth and tapering. No aneurysms are seen. Right transverse/sigmoid sinus dural AV fistula in supplied by right posterior meningeal artery as well as right AICA and SCA branches. Normal venous drainage is for [place] through the left transverse and sigmoid sinus. Right CCA angiograms: Cervical angiograms show normal course and caliber of the visualized right common carotid and internal carotid arteries. There are no significant stenoses. Right ICA angiograms: There is brisk vascular contrast filling of the right ACA and MCA vascular trees. Luminal caliber is smooth and tapering. No aneurysms are seen. No regions of abnormal hypervascularity are noted. Faint early opacification of the right transverse sinus is seen via prominent artery of Bern[place]i and Cassinari. Preferential interior drainage into the sphenoid parietal sinus and posteriorly into the left transverse and sigmoid sinus. Right ECA angiograms: Prominent dural AV fistula seen in the left transverse/sigmoid sinus supplied by branches of the right middle meningeal artery, posterior ring level artery and occipital artery. The venous drainage is anterograde. No evidence of cortical venous reflux. PROCEDURE: No intervention  performed. 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 Venous drainage remains anterograde with no evidence of cortical venous reflux. Physiological venous drainage via left transverse and sigmoid sinus as well as bilateral sphenoparietal sinuses. PLAN: Patient will return for office consultation to discuss dural AV fistula management. Electronically Signed   By: Pedro Earls M.D.   On: 09/24/2021 17:23   IR ANGIO VERTEBRAL SEL VERTEBRAL BILAT MOD SED  Result Date: 09/24/2021 INDICATION: 41 year old female with past medical history significant for chronic headache, IBS and thyroid disease presenting with pulsatile tinnitus. MR angiogram performed September 10, 2021 was suggestive of a right transverse sinus dural AV fistula. She comes to our service today for a diagnostic cerebral angiogram to  evaluate MRI findings. EXAM: ULTRASOUND-GUIDED VASCULAR ACCESS DIAGNOSTIC CEREBRAL ANGIOGRAM COMPARISON:  MRI/MR angiogram of the head and neck September 10, 2021. MEDICATIONS: 5,000 IU heparin, 5 mg Verapamil and 400 mcg nitroglycerin ANESTHESIA/SEDATION: Moderate (conscious) sedation was employed during this procedure. A total of Versed 1 mg and Fentanyl 25 mcg was administered intravenously by the radiology nurse. Total intra-service moderate Sedation Time: 67 minutes. The patient's level of consciousness and vital signs were monitored continuously by radiology nursing throughout the procedure under my direct supervision. CONTRAST:  90 mL of Omnipaque 300 milligram/mL FLUOROSCOPY TIME:  Fluoroscopy Time: 14 minutes 36 seconds (781 mGy). COMPLICATIONS: None immediate. TECHNIQUE: Informed written consent was obtained from the patient after a thorough discussion of the procedural risks, benefits and alternatives. All questions were  addressed. Maximal Sterile Barrier Technique was utilized including caps, mask, sterile gowns, sterile gloves, sterile drape, hand hygiene and skin antiseptic. A timeout was performed prior to the initiation of the procedure. Using the modified Seldinger technique and a micropuncture kit, access was gained to the right radial artery at the wrist and a 5 French sheath was placed. Real-time ultrasound guidance was utilized for vascular access including the acquisition of a permanent ultrasound image documenting patency of the accessed vessel. Slow intra arterial infusion of 5,000 IU heparin, 5 mg Verapamil and 200 mcg nitroglycerin diluted in patient's own blood was performed. No significant fluctuation in patient's blood pressure seen. Then, a right radial artery angiogram was obtained via sheath side port. Normal brachial artery branching pattern seen. No significant anatomical variation. The right radial artery caliber is adequate for vascular access. Next, a 5 [place] Simmons 2 glide catheter was navigated over a 0.035" Terumo Glidewire into the right subclavian artery under fluoroscopic guidance. Using roadmap guidance, the catheter was advanced into the right vertebral artery. Frontal and lateral angiograms of the head were obtained followed by magnified right anterior oblique and magnified lateral views of the skull base. The catheter was then navigated into the aortic arch where the catheter tip was reformed. The catheter was then placed into the left common carotid artery. Frontal and lateral angiograms of the neck were obtained. Using biplane roadmap, the catheter was navigated into the left internal carotid artery. Frontal and lateral angiograms of the head were obtained. The catheter was then retracted into the left common carotid artery. Using biplane roadmap, the catheter was advanced into the left external carotid artery. Frontal and lateral angiograms of the head were obtained. Next, the catheter was  placed into the left subclavian artery. Using roadmap guidance, the catheter was placed into the left vertebral artery. Frontal and lateral angiograms of the head were obtained. The catheter was then placed into the right common carotid artery. Frontal and lateral angiograms of the neck were obtained. Using biplane roadmap, the catheter was advanced into the right internal carotid artery. Frontal and lateral angiograms of the head were obtained. The catheter was retracted into the right common carotid artery and under biplane roadmap the catheter was advanced into the right internal maxillary artery. Frontal, lateral and bilateral magnified oblique views of the head obtained. Then, the catheter was retracted into the proximal right external carotid artery. Frontal and lateral angiograms of the head were obtained. The catheter was then retracted to the level of the right occipital artery. Frontal and lateral angiograms of the head were obtained. The catheter was retracted into the mid right radial artery. 200 mcg of nitroglycerin was injected 4 vasospasm treatment. The catheter was subsequently withdrawn.  An inflatable band was placed and inflated over the right wrist access site. The vascular sheath was withdrawn and the band was slowly deflated until brisk flow was noted through the arteriotomy site. At this point, the band was reinflated with additional 3 cc of air to obtain patent hemostasis. FINDINGS: Right radial artery ultrasound and right radial artery angiogram: The caliber of the distal right radial artery is appropriate for angiogram access. The right radial artery and the right ulnar artery have normal course and caliber. No significant anatomical variants noted. Right vertebral artery angiograms: The right vertebral artery, basilar artery, and bilateral posterior cerebral arteries are unremarkable. Luminal caliber is smooth and tapering. No aneurysms are seen. 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 Visualized dural sinuses are patent. Left CCA angiograms: Cervical angiograms show normal course and caliber of the visualized left common carotid and internal carotid arteries. There are no significant stenoses. Left ICA angiograms: There is brisk vascular contrast filling of the left ACA and MCA vascular trees. Luminal caliber is smooth and tapering. No aneurysms or abnormally high-flow, early draining veins are seen. No regions of abnormal hypervascularity are noted. No venous drainage in the to the right transverse or sigmoid sinus. The left transverse and sigmoid sinuses are widely patent. Left ECA angiograms: No early venous drainage was noted. The visualized branches of the left external carotid artery are unremarkable. Left vertebral artery angiograms: The left vertebral artery, basilar artery, and bilateral posterior cerebral arteries are unremarkable. Luminal caliber is smooth and tapering. No aneurysms are seen. Right transverse/sigmoid sinus dural AV fistula in supplied by right posterior meningeal artery as well as right AICA and SCA branches. Normal venous drainage is for [place] through the left transverse and sigmoid sinus. Right CCA angiograms: Cervical angiograms show normal course and caliber of the visualized right common carotid and internal carotid arteries. There are no significant stenoses. Right ICA angiograms: There is brisk vascular contrast filling of the right ACA and MCA vascular trees. Luminal caliber is smooth and tapering. No aneurysms are seen. No regions of abnormal hypervascularity are noted. Faint early opacification of the right transverse sinus is seen via prominent artery of Bernasconi and Cassinari. Preferential interior drainage into the sphenoid parietal sinus and posteriorly into the left transverse and sigmoid sinus. Right ECA angiograms: Prominent dural AV fistula seen in the left  transverse/sigmoid sinus supplied by branches of the right middle meningeal artery, posterior ring level artery and occipital artery. The venous drainage is anterograde. No evidence of cortical venous reflux. PROCEDURE: No intervention performed. 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 Venous drainage remains anterograde with no evidence of cortical venous reflux. Physiological venous drainage via left transverse and sigmoid sinus as well as bilateral sphenoparietal sinuses. PLAN: Patient will return for office consultation to discuss dural AV fistula management. Electronically Signed   By: Pedro Earls M.D.   On: 09/24/2021 17:23   IR ANGIO EXTERNAL CAROTID SEL EXT CAROTID BILAT MOD SED  Result Date: 09/24/2021 INDICATION: 41 year old female with past medical history significant for chronic headache, IBS and thyroid disease presenting with pulsatile tinnitus. MR angiogram performed September 10, 2021 was suggestive of a right transverse sinus dural AV fistula. She comes to our service today for a diagnostic cerebral angiogram to evaluate MRI findings. EXAM:  ULTRASOUND-GUIDED VASCULAR ACCESS DIAGNOSTIC CEREBRAL ANGIOGRAM COMPARISON:  MRI/MR angiogram of the head and neck September 10, 2021. MEDICATIONS: 5,000 IU heparin, 5 mg Verapamil and 400 mcg nitroglycerin ANESTHESIA/SEDATION: Moderate (conscious) sedation was employed during this procedure. A total of Versed 1 mg and Fentanyl 25 mcg was administered intravenously by the radiology nurse. Total intra-service moderate Sedation Time: 67 minutes. The patient's level of consciousness and vital signs were monitored continuously by radiology nursing throughout the procedure under my direct supervision. CONTRAST:  90 mL of Omnipaque 300 milligram/mL FLUOROSCOPY TIME:   Fluoroscopy Time: 14 minutes 36 seconds (781 mGy). COMPLICATIONS: None immediate. TECHNIQUE: Informed written consent was obtained from the patient after a thorough discussion of the procedural risks, benefits and alternatives. All questions were addressed. Maximal Sterile Barrier Technique was utilized including caps, mask, sterile gowns, sterile gloves, sterile drape, hand hygiene and skin antiseptic. A timeout was performed prior to the initiation of the procedure. Using the modified Seldinger technique and a micropuncture kit, access was gained to the right radial artery at the wrist and a 5 French sheath was placed. Real-time ultrasound guidance was utilized for vascular access including the acquisition of a permanent ultrasound image documenting patency of the accessed vessel. Slow intra arterial infusion of 5,000 IU heparin, 5 mg Verapamil and 200 mcg nitroglycerin diluted in patient's own blood was performed. No significant fluctuation in patient's blood pressure seen. Then, a right radial artery angiogram was obtained via sheath side port. Normal brachial artery branching pattern seen. No significant anatomical variation. The right radial artery caliber is adequate for vascular access. Next, a 5 [place] Simmons 2 glide catheter was navigated over a 0.035" Terumo Glidewire into the right subclavian artery under fluoroscopic guidance. Using roadmap guidance, the catheter was advanced into the right vertebral artery. Frontal and lateral angiograms of the head were obtained followed by magnified right anterior oblique and magnified lateral views of the skull base. The catheter was then navigated into the aortic arch where the catheter tip was reformed. The catheter was then placed into the left common carotid artery. Frontal and lateral angiograms of the neck were obtained. Using biplane roadmap, the catheter was navigated into the left internal carotid artery. Frontal and lateral angiograms of the head were  obtained. The catheter was then retracted into the left common carotid artery. Using biplane roadmap, the catheter was advanced into the left external carotid artery. Frontal and lateral angiograms of the head were obtained. Next, the catheter was placed into the left subclavian artery. Using roadmap guidance, the catheter was placed into the left vertebral artery. Frontal and lateral angiograms of the head were obtained. The catheter was then placed into the right common carotid artery. Frontal and lateral angiograms of the neck were obtained. Using biplane roadmap, the catheter was advanced into the right internal carotid artery. Frontal and lateral angiograms of the head were obtained. The catheter was retracted into the right common carotid artery and under biplane roadmap the catheter was advanced into the right internal maxillary artery. Frontal, lateral and bilateral magnified oblique views of the head obtained. Then, the catheter was retracted into the proximal right external carotid artery. Frontal and lateral angiograms of the head were obtained. The catheter was then retracted to the level of the right occipital artery. Frontal and lateral angiograms of the head were obtained. The catheter was retracted into the mid right radial artery. 200 mcg of nitroglycerin was injected 4 vasospasm treatment. The catheter was subsequently withdrawn. An inflatable band was  placed and inflated over the right wrist access site. The vascular sheath was withdrawn and the band was slowly deflated until brisk flow was noted through the arteriotomy site. At this point, the band was reinflated with additional 3 cc of air to obtain patent hemostasis. FINDINGS: Right radial artery ultrasound and right radial artery angiogram: The caliber of the distal right radial artery is appropriate for angiogram access. The right radial artery and the right ulnar artery have normal course and caliber. No significant anatomical variants  noted. Right vertebral artery angiograms: The right vertebral artery, basilar artery, and bilateral posterior cerebral arteries are unremarkable. Luminal caliber is smooth and tapering. No aneurysms are seen. 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 Visualized dural sinuses are patent. Left CCA angiograms: Cervical angiograms show normal course and caliber of the visualized left common carotid and internal carotid arteries. There are no significant stenoses. Left ICA angiograms: There is brisk vascular contrast filling of the left ACA and MCA vascular trees. Luminal caliber is smooth and tapering. No aneurysms or abnormally high-flow, early draining veins are seen. No regions of abnormal hypervascularity are noted. No venous drainage in the to the right transverse or sigmoid sinus. The left transverse and sigmoid sinuses are widely patent. Left ECA angiograms: No early venous drainage was noted. The visualized branches of the left external carotid artery are unremarkable. Left vertebral artery angiograms: The left vertebral artery, basilar artery, and bilateral posterior cerebral arteries are unremarkable. Luminal caliber is smooth and tapering. No aneurysms are seen. Right transverse/sigmoid sinus dural AV fistula in supplied by right posterior meningeal artery as well as right AICA and SCA branches. Normal venous drainage is for [place] through the left transverse and sigmoid sinus. Right CCA angiograms: Cervical angiograms show normal course and caliber of the visualized right common carotid and internal carotid arteries. There are no significant stenoses. Right ICA angiograms: There is brisk vascular contrast filling of the right ACA and MCA vascular trees. Luminal caliber is smooth and tapering. No aneurysms are seen. No regions of abnormal hypervascularity are noted. Faint early opacification of the right transverse sinus is seen via prominent  artery of Bernasconi and Cassinari. Preferential interior drainage into the sphenoid parietal sinus and posteriorly into the left transverse and sigmoid sinus. Right ECA angiograms: Prominent dural AV fistula seen in the left transverse/sigmoid sinus supplied by branches of the right middle meningeal artery, posterior ring level artery and occipital artery. The venous drainage is anterograde. No evidence of cortical venous reflux. PROCEDURE: No intervention performed. 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 Venous drainage remains anterograde with no evidence of cortical venous reflux. Physiological venous drainage via left transverse and sigmoid sinus as well as bilateral sphenoparietal sinuses. PLAN: Patient will return for office consultation to discuss dural AV fistula management. Electronically Signed   By: Pedro Earls M.D.   On: 09/24/2021 17:23    Labs:  CBC: Recent Labs    09/24/21 1124 10/13/21 0741  WBC 5.0 5.5  HGB 15.1* 15.2*  HCT 47.1* 45.8  PLT 152 145*    COAGS: Recent Labs    09/24/21 1220 10/13/21 0741  INR 1.1 1.1    BMP: Recent Labs    09/24/21 1124 10/13/21 0741  NA 138 137  K 4.2 4.3  CL  101 103  CO2 28 25  GLUCOSE 90 94  BUN 10 14  CALCIUM 9.7 9.6  CREATININE 0.67 0.71  GFRNONAA >60 >60    LIVER FUNCTION TESTS: No results for input(s): BILITOT, AST, ALT, ALKPHOS, PROT, ALBUMIN in the last 8760 hours.  TUMOR MARKERS: No results for input(s): AFPTM, CEA, CA199, CHROMGRNA in the last 8760 hours.  Assessment and Plan:  Dural AV Fistula: Bebe Liter, 41 year old female, presents today to the [facility] Neuro Interventional Radiology department for an image-guided endovascular treatment of a dural AV fistula. This procedure will be done under general  anesthesia with planned overnight observation in the Neuro ICU.  Risks and benefits of this procedure were discussed with the patient including, but not limited to bleeding, infection, vascular injury or contrast induced renal failure.  This interventional procedure involves the use of X-rays and because of the nature of the planned procedure, it is possible that we will have prolonged use of X-ray fluoroscopy.  Potential radiation risks to you include (but are not limited to) the following: - A slightly elevated risk for cancer  several years later in life. This risk is typically less than 0.5% percent. This risk is low in comparison to the normal incidence of human cancer, which is 33% for women and 50% for men according to the [employer]. - Radiation induced injury can include skin redness, resembling a rash, tissue breakdown / ulcers and hair loss (which can be temporary or permanent).   The likelihood of either of these occurring depends on the difficulty of the procedure and whether you are sensitive to radiation due to previous procedures, disease, or genetic conditions.   IF your procedure requires a prolonged use of radiation, you will be notified and given written instructions for further action.  It is your responsibility to monitor the irradiated area for the 2 weeks following the procedure and to notify your physician if you are concerned that you have suffered a radiation induced injury.    All of the patient's questions were answered, patient is agreeable to proceed.  Consent signed and in chart.  Thank you for this interesting consult.  I greatly enjoyed meeting Velia Pamer and look forward to participating in their care.  A copy of this report was sent to the requesting provider on this date.  Electronically Signed: Soyla Dryer, AGACNP-BC [phone] 10/13/2021, 8:30 AM   I spent a total of  30 Minutes   in face to face in clinical consultation, greater  than 50% of which was counseling/coordinating care for endovascular treatment of dural AV fistula

## 2021-10-12 NOTE — H&P (Deleted)
  The note originally documented on this encounter has been moved the the encounter in which it belongs.  

## 2021-10-13 ENCOUNTER — Ambulatory Visit (HOSPITAL_COMMUNITY): Payer: BC Managed Care – PPO | Admitting: Anesthesiology

## 2021-10-13 ENCOUNTER — Observation Stay (HOSPITAL_COMMUNITY)
Admission: RE | Admit: 2021-10-13 | Discharge: 2021-10-13 | Disposition: A | Discharge status: Home / Self Care | Payer: BC Managed Care – PPO | Source: Ambulatory Visit | Attending: Neuroradiology | Admitting: Neuroradiology

## 2021-10-13 ENCOUNTER — Encounter (HOSPITAL_COMMUNITY)
Admission: AD | Disposition: A | Discharge status: Home / Self Care | Payer: Self-pay | Source: Home / Self Care | Attending: Neuroradiology

## 2021-10-13 ENCOUNTER — Other Ambulatory Visit: Payer: Self-pay

## 2021-10-13 ENCOUNTER — Encounter (HOSPITAL_COMMUNITY): Payer: Self-pay

## 2021-10-13 ENCOUNTER — Inpatient Hospital Stay (HOSPITAL_COMMUNITY)
Admission: AD | Admit: 2021-10-13 | Discharge: 2021-10-14 | DRG: 027 | Disposition: A | Discharge status: Home / Self Care | Payer: BC Managed Care – PPO | Attending: Neuroradiology | Admitting: Neuroradiology

## 2021-10-13 DIAGNOSIS — R519 Headache, unspecified: Secondary | ICD-10-CM | POA: Diagnosis present

## 2021-10-13 DIAGNOSIS — Z881 Allergy status to other antibiotic agents status: Secondary | ICD-10-CM | POA: Diagnosis not present

## 2021-10-13 DIAGNOSIS — Z975 Presence of (intrauterine) contraceptive device: Secondary | ICD-10-CM

## 2021-10-13 DIAGNOSIS — J45909 Unspecified asthma, uncomplicated: Secondary | ICD-10-CM | POA: Diagnosis present

## 2021-10-13 DIAGNOSIS — Z79890 Hormone replacement therapy (postmenopausal): Secondary | ICD-10-CM | POA: Diagnosis not present

## 2021-10-13 DIAGNOSIS — Z880 Allergy status to penicillin: Secondary | ICD-10-CM | POA: Diagnosis not present

## 2021-10-13 DIAGNOSIS — Z79899 Other long term (current) drug therapy: Secondary | ICD-10-CM

## 2021-10-13 DIAGNOSIS — I671 Cerebral aneurysm, nonruptured: Principal | ICD-10-CM | POA: Diagnosis present

## 2021-10-13 DIAGNOSIS — K589 Irritable bowel syndrome without diarrhea: Secondary | ICD-10-CM | POA: Diagnosis present

## 2021-10-13 DIAGNOSIS — E039 Hypothyroidism, unspecified: Secondary | ICD-10-CM | POA: Diagnosis present

## 2021-10-13 DIAGNOSIS — H93A9 Pulsatile tinnitus, unspecified ear: Secondary | ICD-10-CM | POA: Diagnosis present

## 2021-10-13 DIAGNOSIS — Z793 Long term (current) use of hormonal contraceptives: Secondary | ICD-10-CM | POA: Diagnosis not present

## 2021-10-13 HISTORY — DX: Unspecified asthma, uncomplicated: J45.909

## 2021-10-13 HISTORY — PX: IR NEURO EACH ADD'L AFTER BASIC UNI RIGHT (MS): IMG5374

## 2021-10-13 HISTORY — PX: IR ANGIOGRAM FOLLOW UP STUDY: IMG697

## 2021-10-13 HISTORY — PX: IR TRANSCATH/EMBOLIZ: IMG695

## 2021-10-13 HISTORY — PX: RADIOLOGY WITH ANESTHESIA: SHX6223

## 2021-10-13 HISTORY — PX: IR CT HEAD LTD: IMG2386

## 2021-10-13 HISTORY — PX: IR ANGIO VERTEBRAL SEL SUBCLAVIAN INNOMINATE UNI R MOD SED: IMG5365

## 2021-10-13 HISTORY — DX: Family history of other specified conditions: Z84.89

## 2021-10-13 HISTORY — PX: IR US GUIDE VASC ACCESS RIGHT: IMG2390

## 2021-10-13 HISTORY — DX: Hypothyroidism, unspecified: E03.9

## 2021-10-13 LAB — CBC WITH DIFFERENTIAL/PLATELET
Abs Immature Granulocytes: 0.02 10*3/uL (ref 0.00–0.07)
Basophils Absolute: 0 10*3/uL (ref 0.0–0.1)
Basophils Relative: 0 %
Eosinophils Absolute: 0.4 10*3/uL (ref 0.0–0.5)
Eosinophils Relative: 7 %
HCT: 45.8 % (ref 36.0–46.0)
Hemoglobin: 15.2 g/dL — ABNORMAL HIGH (ref 12.0–15.0)
Immature Granulocytes: 0 %
Lymphocytes Relative: 26 %
Lymphs Abs: 1.4 10*3/uL (ref 0.7–4.0)
MCH: 32.1 pg (ref 26.0–34.0)
MCHC: 33.2 g/dL (ref 30.0–36.0)
MCV: 96.6 fL (ref 80.0–100.0)
Monocytes Absolute: 0.5 10*3/uL (ref 0.1–1.0)
Monocytes Relative: 8 %
Neutro Abs: 3.2 10*3/uL (ref 1.7–7.7)
Neutrophils Relative %: 59 %
Platelets: 145 10*3/uL — ABNORMAL LOW (ref 150–400)
RBC: 4.74 MIL/uL (ref 3.87–5.11)
RDW: 11.6 % (ref 11.5–15.5)
WBC: 5.5 10*3/uL (ref 4.0–10.5)
nRBC: 0 % (ref 0.0–0.2)

## 2021-10-13 LAB — BASIC METABOLIC PANEL
Anion gap: 9 (ref 5–15)
BUN: 14 mg/dL (ref 6–20)
CO2: 25 mmol/L (ref 22–32)
Calcium: 9.6 mg/dL (ref 8.9–10.3)
Chloride: 103 mmol/L (ref 98–111)
Creatinine, Ser: 0.71 mg/dL (ref 0.44–1.00)
GFR, Estimated: >60 mL/min (ref >60)
Glucose, Bld: 94 mg/dL (ref 70–99)
Potassium: 4.3 mmol/L (ref 3.5–5.1)
Sodium: 137 mmol/L (ref 135–145)

## 2021-10-13 LAB — POCT ACTIVATED CLOTTING TIME
Activated Clotting Time: 131 seconds
Activated Clotting Time: 251 seconds
Activated Clotting Time: 287 seconds
Activated Clotting Time: 293 seconds
Activated Clotting Time: 311 seconds
Activated Clotting Time: 311 seconds
Activated Clotting Time: 317 seconds
Activated Clotting Time: 317 seconds

## 2021-10-13 LAB — POCT PREGNANCY, URINE: Preg Test, Ur: NEGATIVE

## 2021-10-13 LAB — PROTIME-INR
INR: 1.1 (ref 0.8–1.2)
Prothrombin Time: 13.9 seconds (ref 11.4–15.2)

## 2021-10-13 IMAGING — XA IR TRANSCATH EMBOLIZATION
9 of 22 series · 9 of 24 positions shown · IV contrast (IODINE)
Comparison: Cerebral angiogram [DATE].

INDICATION: 40-year-old female with past medical history significant for chronic
headache, IBS and thyroid disease presenting with pulsatile tinnitus
with significant impact in her quality of life. MR angiogram
performed [DATE] was suggestive of a right
transverse/sigmoid sinus dural AV fistula. She underwent a
diagnostic cerebral angiogram in [DATE] that confirmed TANU
TANU type 2A right sigmoid dural AV fistula.

EXAM:
ULTRASOUND-GUIDED VASCULAR [REDACTED] CEREBRAL ANGIOGRAM
ENDOVASCULAR EMBOLIZATION OF DURAL ARTERIOVENOUS FISTULA
FLAT PANEL HEAD CT
TECHNIQUE: Informed written consent was obtained from the patient after a
thorough discussion of the procedural risks, benefits and
alternatives. All questions were addressed.

[Series 1: fl neuro n · 1 of 33 frames shown]
[frame 29/33]
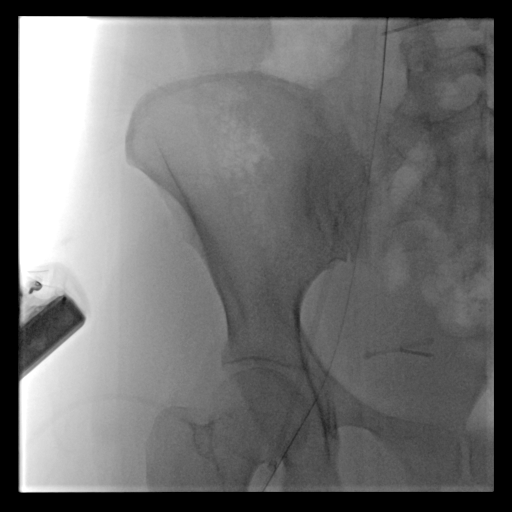

[Series 4: cerebral care 2 · 2 acquisitions, 1 frame shown (1 of 8)]
[im 1/2]
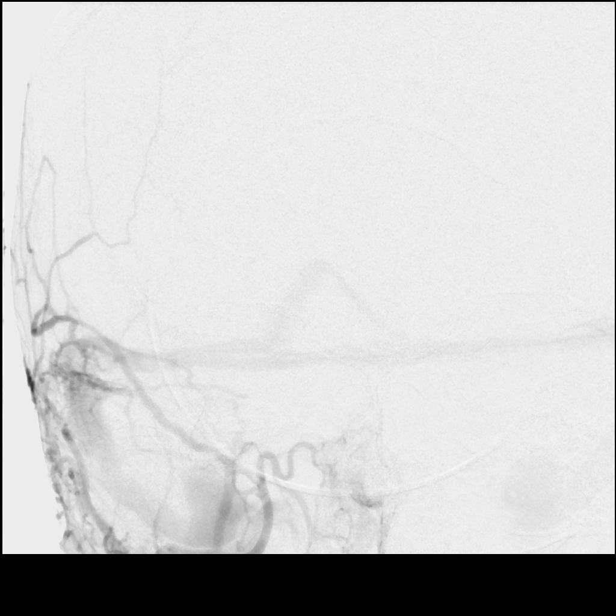

[Series 6: cerebral care 2 · 2 acquisitions, 1 frame shown (2 of 8)]
[im 1/2]
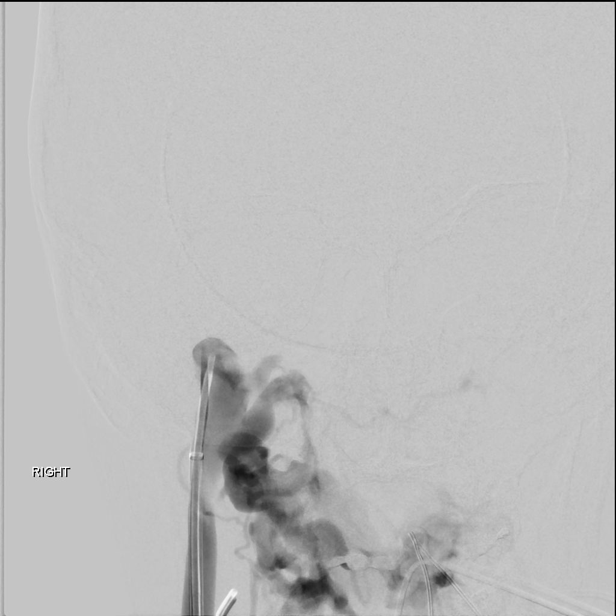

[Series 10: cerebral care 2 · 2 acquisitions, 1 frame shown (3 of 8)]
[im 1/2]
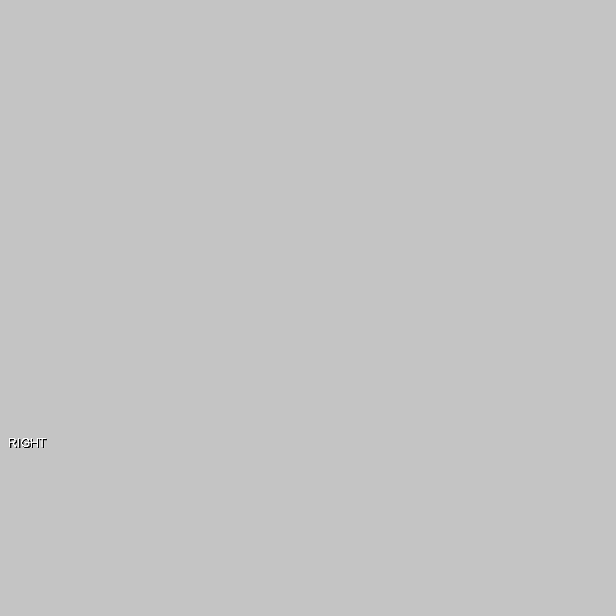

[Series 13: cerebral care 2 · 2 acquisitions, 1 frame shown (4 of 8)]
[im 1/2]
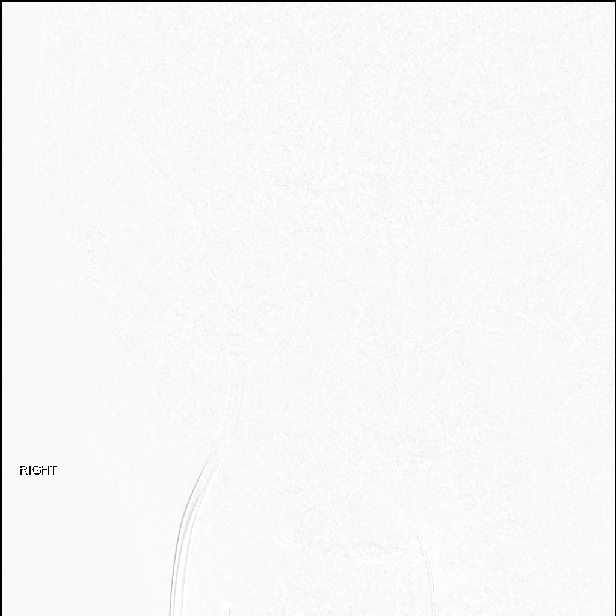

[Series 14: cerebral care 2 · 2 acquisitions, 1 frame shown (5 of 8)]
[im 1/2]
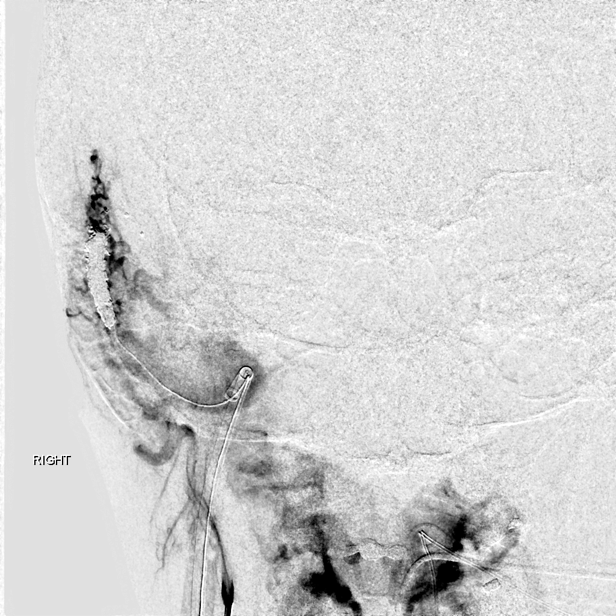

[Series 17: cerebral care 2 · 2 acquisitions, 1 frame shown (6 of 8)]
[im 1/2]
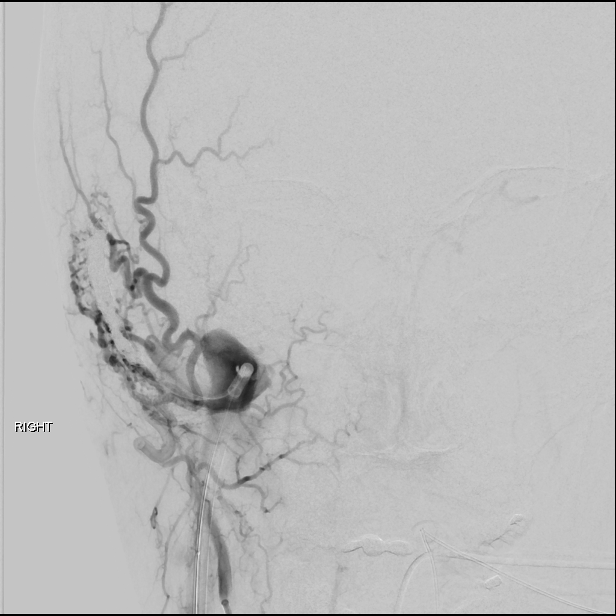

[Series 20: cerebral care 2 · 2 acquisitions, 1 frame shown (7 of 8)]
[im 1/2]
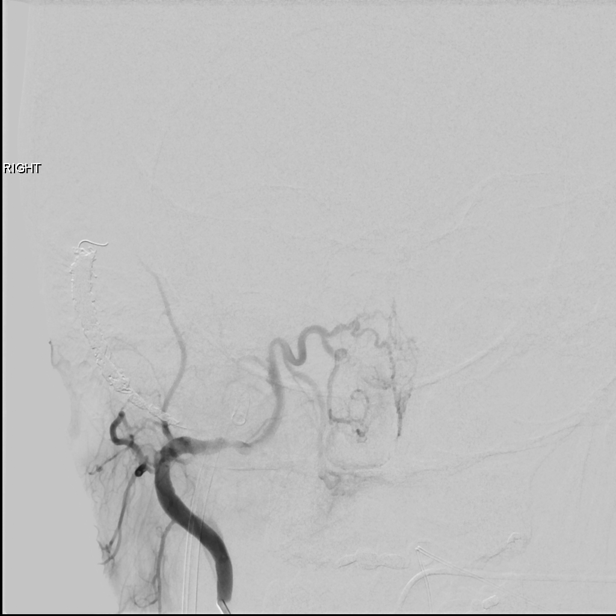

[Series 22: cerebral care 2 · 2 acquisitions, 1 frame shown (8 of 8)]
[im 1/2]
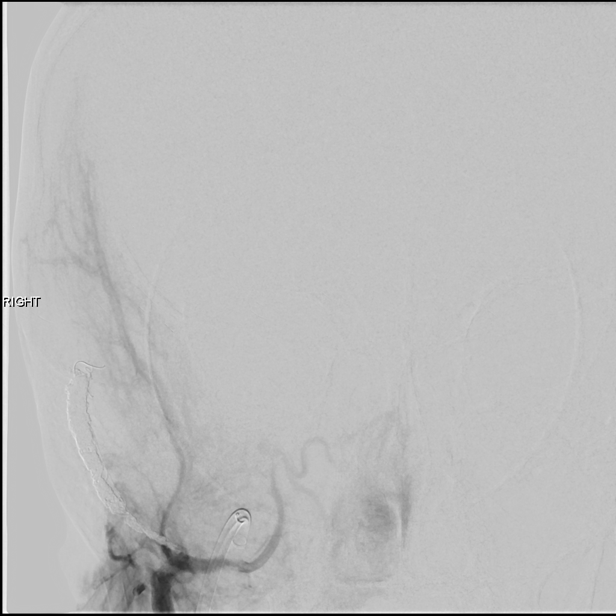

[9 of 24 positions shown; findings below may reference images not displayed]

MEDICATIONS:
Vancomycin 1 gm IV. The antibiotic was administered within 1 hour of
the procedure.

ANESTHESIA/SEDATION:
The procedure was performed under general anesthesia.

CONTRAST:  125 mL Omnipaque 300 milligram/mL.

FLUOROSCOPY:
Radiation Exposure Index (as provided by the fluoroscopic device):
1,209 mGy Kerma.

COMPLICATIONS:
None immediate.
Maximal Sterile Barrier Technique was utilized including caps, mask,
sterile gowns, sterile gloves, sterile drape, hand hygiene and skin
antiseptic. A timeout was performed prior to the initiation of the
procedure.

The right groin was prepped and draped in the usual sterile fashion.
Using a micropuncture kit and the modified Seldinger technique,
access was gained to the right common femoral artery and a 6 French
sheath was placed. Real-time ultrasound guidance was utilized for
vascular access including the acquisition of a permanent ultrasound
image documenting patency of the accessed vessel.

Next, using a micropuncture kit and the modified Seldinger
technique, access was gained to the right common femoral vein and an
8 French sheath was placed. Real-time ultrasound guidance was
utilized for vascular access including the acquisition of a
permanent ultrasound image documenting patency of the accessed
vessel.

Through the arterial access and under fluoroscopy, a 4 TANU
TANU 2 catheter was navigated over a 0.035" Terumo Glidewire
into the aortic arch. The catheter was placed into the right common
carotid artery and then advanced into the right internal carotid
artery. Frontal and lateral angiograms of the head were obtained.

The catheter was then retracted into the right external carotid
artery and under fluoroscopic guidance, advanced into the left
posterior auricular artery. Frontal and lateral angiograms of the
head were obtained. The catheter was then placed into the internal
maxillary artery. Frontal and lateral angiograms of the head were
obtained. Finally, the catheter was placed into the left occipital
artery. Frontal and lateral angiograms of the head were obtained.

Through venous access and under fluoroscopy, an 8 French Neuron Max
guide catheter was navigated over a 6 TANU 2 catheter
and a 0.035" Terumo Glidewire into the superior vena cava. The
catheter was placed into the right internal jugular vein and then
advanced into the right jugular bulb. The diagnostic catheter was
removed. Frontal and lateral venograms of the skull base were
obtained.
FINDINGS: 1. Normal caliber of the right common femoral artery, adequate for
vascular access.
2. Normal caliber of the right common femoral artery, adequate for
vascular access.
3. Right internal carotid artery angiograms showed brisk contrast
opacification of the right MCA and ACA vascular trees a right
sigmoid sinus fistula is seen supplied by the lateral tentorial
artery. Physiological venous drainage through the left
transverse/sigmoid sinus with fistula draining into the right
sigmoid sinus.
4. Right posterior auricular artery angiograms showed dural AV
fistula supplied by posterior auricular artery branches at the right
transverse-sigmoid junction with retrograde flow into the
contralateral transverse sinus.
5. Right internal maxillary artery angiograms showed dural AV
fistula supplied by petrous and petrosquamous branches for of the
right middle meningeal artery in draining into the
transverse-sigmoid sinus junction noting retrograde flow into the
left transverse sinus.
6. Right occipital artery angiograms showed dural AV fistula
supplied by branches of the occipital artery draining into the right
transverse-sigmoid junction as well as in the sigmoid sinus near the
jugular bulb. Retrograde flow into the torcular, proximal superior
sagittal sinus and left transverse sinus are noted.
7. There appear to be a separate channel of the right sigmoid sinus
draining the fistula that is partially separated from the main sinus
lumen.
8. Right jugular bulb venogram confirms location of the catheter
within the jugular bulb with rapid washout. Prominent posterior
cervical ectatic veins are noted.

PROCEDURE:
Through the arterial catheter placed in the right occipital artery,
magnified frontal and lateral views of the head were obtained
centered on the posterior fossa. Images were utilized as roadmap.

Through the neuron max, a scepter balloon catheter was navigated
over an Aristotle 14 micro guidewire into the right sigmoid sinus.
Except the balloon was inflated and angiograms were obtained via
right occipital artery contrast injection with frontal and lateral
views the working projections. Angiogram confirmed the presence of 2
separate venous sinus channels with fistula draining into the
smaller, most posteriorly located channel.

Using biplane roadmap, an SL 10 microcatheter was navigated over a
synchro support microguidewire into the right sigmoid posterior
channel. Magnified angiograms with frontal and lateral views confirm
the presence of the microcatheter within the separate channel. There
are, coil embolization of this channel was performed via SL 10
microcatheter. Control angiograms were obtained after coil
placement. A 3 mm x 6 cm 3D Axium coil, a 5 mm x 15 cm 3 Axium coil,
a 2 mm x 8 cm helix Axium Prime coil, a 2.5 mm x 6 cm 3D Axium Prime
coil, a 3 mm x 10 cm helix Axium Prime coil, a 3 mm x 10 cm you
leaks Axium Prime coil, a 5 mm x 15 cm 3D Axium coil, a 6 mm x 20 cm
3D Axium coil, a 6 mm x 20 cm 3D Axium coil, a 7 mm x 30 cm 3D Axium
Prime coil and a 5 mm x 20 cm 3D Axium Prime coil were implanted. An
attempt to implant a 5 mm x 20 cm 3D Axium Prime coil resulted in
coil stretching. This coil with the corresponding microcatheter were
then retracted and removed.

Then, using biplane roadmap guidance, a headway duo was navigated
over a synchro 2 micro guidewire into the right sigmoid sinus
posterior pouch where it coiling was greater the performed as
described above. Additional coils were placed followed by control
angiograms obtained via right occipital artery contrast injection
with frontal and lateral views of the head. A 2 mm x 4 cm helix
Axium Prime coil, a 3 mm x 8 cm 3D Axium Prime coil, a 2.5 mm x 4 cm
3D Axium Prime coil and a 2 mm x 3 cm helix Axium Prime coil were
implanted.

Right occipital artery angiograms with magnified frontal and lateral
views of the head no longer shows early venous drainage into the
right sigmoid sinus. Faint delayed contrast opacification at the
transverse-sigmoid sinus is noted which appear to represent minimal
residual fistula, likely representing less than 5% of the original
fistula.

The TANU 2 catheter was placed into the right internal
maxillary artery. Angiogram showed no opacification of the right
middle meningeal artery, except for its origin, likely related to
stasis.

Then, the catheter was placed into the right internal carotid artery
angiograms show brisk opacification of the right ACA and MCA
vascular tree without evidence of thromboembolic complication. No
early opacification of the right sigmoid sinus seen via lateral
tentorial artery which appear decreased in caliber compared to pre
embolization angiogram. Physiological anterograde flow through the
right transverse and sigmoid sinus is seen.

The catheter was subsequently retracted into the innominate artery
and advanced into the right subclavian artery and then into the
cervical right vertebral artery. Frontal and lateral angiograms of
the head were obtained. Early opacification of the right sigmoid
sinus via posterior meningeal artery described on prior angiogram is
no longer seen. Physiological anterograde flow through the right
transverse and sigmoid sinus is seen.

The arterial and venous catheters were subsequently withdrawn.

Flat panel CT of the head was obtained and post processed in a
separate workstation with concurrent attending physician
supervision. Selected images were sent to PACS. No evidence of
hemorrhagic complication noted.

Right common femoral artery angiogram was obtained in right anterior
oblique view. The puncture is at the level of the common femoral
artery. The artery has normal caliber, adequate for closure device.
A 5 French Exoseal was utilized for arterial access closure. The
venous sheath was removed. Manual pressure was held over the
arterial and venous access site for approximately 20 minutes.
Adequate hemostasis was achieved.
IMPRESSION: 1. Successful and uncomplicated endovascular treatment of TANU
type 2A right sigmoid sinus dural arteriovenous fistula with
selective venous embolization resulting in near complete fistula
occlusion. Minimal residual fistula may be present at the
transverse-sigmoid junction with delayed venous opacification,
likely representing less than 5% of original fistula.
2. No evidence of thromboembolic or hemorrhagic complication on
final angiograms and flat panel CT.

PLAN:
Patient transferred to ICU for overnight observation.

## 2021-10-13 SURGERY — IR WITH ANESTHESIA
Anesthesia: General

## 2021-10-13 MED ORDER — ESMOLOL HCL 100 MG/10ML IV SOLN
INTRAVENOUS | Status: DC | PRN
  Administered 2021-10-13: 40 mg via INTRAVENOUS
  Administered 2021-10-13: 60 mg via INTRAVENOUS

## 2021-10-13 MED ORDER — MIDAZOLAM HCL 5 MG/5ML IJ SOLN
INTRAMUSCULAR | Status: DC | PRN
  Administered 2021-10-13: 2 mg via INTRAVENOUS

## 2021-10-13 MED ORDER — IOHEXOL 300 MG/ML  SOLN: 100.0000 mL | Freq: Once | INTRAMUSCULAR | Status: DC | PRN

## 2021-10-13 MED ORDER — OXYCODONE HCL 5 MG PO TABS: 5.0000 mg | ORAL_TABLET | Freq: Once | ORAL | Status: DC | PRN

## 2021-10-13 MED ORDER — ACETAMINOPHEN 10 MG/ML IV SOLN
INTRAVENOUS | Status: AC
  Filled 2021-10-13: qty 100

## 2021-10-13 MED ORDER — CHLORHEXIDINE GLUCONATE 0.12 % MT SOLN
OROMUCOSAL | Status: AC
  Administered 2021-10-13: 15 mL via OROMUCOSAL
  Filled 2021-10-13: qty 15

## 2021-10-13 MED ORDER — PHENYLEPHRINE HCL-NACL 20-0.9 MG/250ML-% IV SOLN: 0.0000 ug/min | INTRAVENOUS | Status: DC

## 2021-10-13 MED ORDER — OXYCODONE-ACETAMINOPHEN 5-325 MG PO TABS
1.0000 | ORAL_TABLET | ORAL | Status: DC | PRN
  Administered 2021-10-13: 1 via ORAL
  Filled 2021-10-13: qty 1

## 2021-10-13 MED ORDER — ASPIRIN 81 MG PO CHEW
81.0000 mg | CHEWABLE_TABLET | Freq: Every day | ORAL | Status: DC
  Administered 2021-10-13 – 2021-10-14 (×2): 81 mg via ORAL
  Filled 2021-10-13 (×2): qty 1

## 2021-10-13 MED ORDER — MIDAZOLAM HCL 2 MG/2ML IJ SOLN
INTRAMUSCULAR | Status: AC
  Filled 2021-10-13: qty 2

## 2021-10-13 MED ORDER — FENTANYL CITRATE (PF) 250 MCG/5ML IJ SOLN
INTRAMUSCULAR | Status: DC | PRN
  Administered 2021-10-13: 100 ug via INTRAVENOUS

## 2021-10-13 MED ORDER — ACETAMINOPHEN 160 MG/5ML PO SOLN: 650.0000 mg | ORAL | Status: DC | PRN

## 2021-10-13 MED ORDER — PHENYLEPHRINE 40 MCG/ML (10ML) SYRINGE FOR IV PUSH (FOR BLOOD PRESSURE SUPPORT)
PREFILLED_SYRINGE | INTRAVENOUS | Status: DC | PRN
  Administered 2021-10-13: 40 ug via INTRAVENOUS
  Administered 2021-10-13: 80 ug via INTRAVENOUS
  Administered 2021-10-13 (×3): 40 ug via INTRAVENOUS
  Administered 2021-10-13: 80 ug via INTRAVENOUS
  Administered 2021-10-13 (×2): 40 ug via INTRAVENOUS

## 2021-10-13 MED ORDER — ROCURONIUM BROMIDE 10 MG/ML (PF) SYRINGE
PREFILLED_SYRINGE | INTRAVENOUS | Status: DC | PRN
  Administered 2021-10-13: 10 mg via INTRAVENOUS
  Administered 2021-10-13: 20 mg via INTRAVENOUS
  Administered 2021-10-13: 10 mg via INTRAVENOUS
  Administered 2021-10-13: 20 mg via INTRAVENOUS
  Administered 2021-10-13: 15 mg via INTRAVENOUS
  Administered 2021-10-13: 50 mg via INTRAVENOUS

## 2021-10-13 MED ORDER — ACETAMINOPHEN 10 MG/ML IV SOLN
1000.0000 mg | Freq: Once | INTRAVENOUS | Status: DC | PRN
  Administered 2021-10-13: 1000 mg via INTRAVENOUS

## 2021-10-13 MED ORDER — FENTANYL CITRATE (PF) 100 MCG/2ML IJ SOLN: 25.0000 ug | INTRAMUSCULAR | Status: DC | PRN

## 2021-10-13 MED ORDER — ONDANSETRON HCL 4 MG/2ML IJ SOLN
INTRAMUSCULAR | Status: DC | PRN
  Administered 2021-10-13: 4 mg via INTRAVENOUS

## 2021-10-13 MED ORDER — SODIUM CHLORIDE 0.9 % IV SOLN: INTRAVENOUS | Status: DC | PRN

## 2021-10-13 MED ORDER — VANCOMYCIN HCL IN DEXTROSE 1-5 GM/200ML-% IV SOLN
INTRAVENOUS | Status: AC
  Filled 2021-10-13: qty 200

## 2021-10-13 MED ORDER — NIMODIPINE 30 MG PO CAPS
0.0000 mg | ORAL_CAPSULE | ORAL | Status: AC
  Administered 2021-10-13: 30 mg via ORAL
  Filled 2021-10-13: qty 1

## 2021-10-13 MED ORDER — PROPOFOL 10 MG/ML IV BOLUS
INTRAVENOUS | Status: DC | PRN
  Administered 2021-10-13: 20 mg via INTRAVENOUS
  Administered 2021-10-13: 100 mg via INTRAVENOUS

## 2021-10-13 MED ORDER — LIDOCAINE HCL 1 % IJ SOLN
INTRAMUSCULAR | Status: AC
  Filled 2021-10-13: qty 20

## 2021-10-13 MED ORDER — ACETAMINOPHEN 650 MG RE SUPP: 650.0000 mg | RECTAL | Status: DC | PRN

## 2021-10-13 MED ORDER — CHLORHEXIDINE GLUCONATE 0.12 % MT SOLN: 15.0000 mL | Freq: Once | OROMUCOSAL | Status: AC

## 2021-10-13 MED ORDER — LIDOCAINE 2% (20 MG/ML) 5 ML SYRINGE
INTRAMUSCULAR | Status: DC | PRN
  Administered 2021-10-13: 50 mg via INTRAVENOUS

## 2021-10-13 MED ORDER — ACETAMINOPHEN 500 MG PO TABS: 1000.0000 mg | ORAL_TABLET | Freq: Once | ORAL | Status: DC | PRN

## 2021-10-13 MED ORDER — ACETAMINOPHEN 325 MG PO TABS
650.0000 mg | ORAL_TABLET | ORAL | Status: DC | PRN
  Administered 2021-10-13 – 2021-10-14 (×3): 650 mg via ORAL
  Filled 2021-10-13 (×3): qty 2

## 2021-10-13 MED ORDER — VANCOMYCIN HCL 1000 MG IV SOLR: 1000.0000 mg | INTRAVENOUS | Status: DC

## 2021-10-13 MED ORDER — VANCOMYCIN HCL IN DEXTROSE 1-5 GM/200ML-% IV SOLN
1000.0000 mg | INTRAVENOUS | Status: AC
  Administered 2021-10-13: 1000 mg via INTRAVENOUS

## 2021-10-13 MED ORDER — ONDANSETRON HCL 4 MG/2ML IJ SOLN
4.0000 mg | Freq: Four times a day (QID) | INTRAMUSCULAR | Status: DC | PRN
Start: 2021-10-13 — End: 2021-10-14

## 2021-10-13 MED ORDER — IOHEXOL 300 MG/ML  SOLN
100.0000 mL | Freq: Once | INTRAMUSCULAR | Status: AC | PRN
  Administered 2021-10-13: 50 mL via INTRA_ARTERIAL

## 2021-10-13 MED ORDER — ASPIRIN 81 MG PO CHEW: 81.0000 mg | CHEWABLE_TABLET | Freq: Every day | ORAL | Status: DC

## 2021-10-13 MED ORDER — ACETAMINOPHEN 160 MG/5ML PO SOLN: 1000.0000 mg | Freq: Once | ORAL | Status: DC | PRN

## 2021-10-13 MED ORDER — VERAPAMIL HCL 2.5 MG/ML IV SOLN
INTRAVENOUS | Status: AC | PRN
  Administered 2021-10-13: 1 mg via INTRA_ARTERIAL

## 2021-10-13 MED ORDER — DEXAMETHASONE SODIUM PHOSPHATE 10 MG/ML IJ SOLN
INTRAMUSCULAR | Status: DC | PRN
Start: 2021-10-13 — End: 2021-10-13
  Administered 2021-10-13: 10 mg via INTRAVENOUS

## 2021-10-13 MED ORDER — OXYCODONE HCL 5 MG/5ML PO SOLN: 5.0000 mg | Freq: Once | ORAL | Status: DC | PRN

## 2021-10-13 MED ORDER — CHLORHEXIDINE GLUCONATE CLOTH 2 % EX PADS: 6.0000 | MEDICATED_PAD | Freq: Every day | CUTANEOUS | Status: DC

## 2021-10-13 MED ORDER — SODIUM CHLORIDE 0.9 % IV SOLN: INTRAVENOUS | Status: DC

## 2021-10-13 MED ORDER — IOHEXOL 300 MG/ML  SOLN
100.0000 mL | Freq: Once | INTRAMUSCULAR | Status: AC | PRN
  Administered 2021-10-13: 75 mL via INTRAVENOUS

## 2021-10-13 MED ORDER — FENTANYL CITRATE (PF) 100 MCG/2ML IJ SOLN
INTRAMUSCULAR | Status: AC
  Filled 2021-10-13: qty 2

## 2021-10-13 MED ORDER — PHENYLEPHRINE HCL-NACL 20-0.9 MG/250ML-% IV SOLN
INTRAVENOUS | Status: DC | PRN
  Administered 2021-10-13: 25 ug/min via INTRAVENOUS

## 2021-10-13 MED ORDER — HEPARIN SODIUM (PORCINE) 1000 UNIT/ML IJ SOLN
INTRAMUSCULAR | Status: DC | PRN
  Administered 2021-10-13: 3000 [IU] via INTRAVENOUS

## 2021-10-13 MED ORDER — VERAPAMIL HCL 2.5 MG/ML IV SOLN
INTRAVENOUS | Status: AC
  Filled 2021-10-13: qty 2

## 2021-10-13 NOTE — Sedation Documentation (Signed)
ACT prior to procedure 131 notified Dr Debbrah Alar

## 2021-10-13 NOTE — Procedures (Signed)
INTERVENTIONAL NEURORADIOLOGY BRIEF POSTPROCEDURE NOTE  DIAGNOSTIC CEREBRAL ANGIOGRAM AND ENDOVASCULAR DAVF EMBOLIZATION  Attending: Dr. Baldemar Lenis  Assistant: None.  Diagnosis: Right sigmoid sinus complex d-AVF  Access site: Right common femoral artery and common femoral vein.  Access closure: Artery - exoseal; vein - manual pressure.  Anesthesia: GETA.  Medication used:  Refer to anesthesia documentation.  Complications: None.  Estimated blood loss: Negligible.  Specimen: None.  Findings: Redemonstrated right sigmoid sinus multi hole dAVF. Endovascular embolization performed with coils placed in the draining venous sinus pocket with complete obliteration. No evidence of thromboembolic or hemorrhagic complication.  The patient tolerated the procedure well without incident or complication and is in stable condition.   PLAN:  - Bed rest x 6 hours; may raise head of bed 30 degree after 1.5 hours. - SBP 90 - 140 mmHg.

## 2021-10-13 NOTE — Anesthesia Procedure Notes (Signed)
Procedure Name: Intubation Date/Time: 10/13/2021 9:01 AM Performed by: Lovie Chol, CRNA Pre-anesthesia Checklist: Patient identified, Emergency Drugs available, Suction available and Patient being monitored Patient Re-evaluated:Patient Re-evaluated prior to induction Oxygen Delivery Method: Circle System Utilized Preoxygenation: Pre-oxygenation with 100% oxygen Induction Type: IV induction Ventilation: Mask ventilation without difficulty Laryngoscope Size: Miller and 2 Grade View: Grade I Tube type: Oral Tube size: 7.0 mm Number of attempts: 1 Airway Equipment and Method: Stylet and Oral airway Placement Confirmation: ETT inserted through vocal cords under direct vision, positive ETCO2 and breath sounds checked- equal and bilateral Secured at: 21 cm Tube secured with: Tape Dental Injury: Teeth and Oropharynx as per pre-operative assessment

## 2021-10-13 NOTE — Sedation Documentation (Signed)
ACT 317 notified Dr Rodrigues  °

## 2021-10-13 NOTE — Sedation Documentation (Signed)
ACT 287 notified Dr Debbrah Alar.

## 2021-10-13 NOTE — Transfer of Care (Signed)
Immediate Anesthesia Transfer of Care Note  Patient: Ciarah Astin  Procedure(s) Performed: EMBOLIZATION  Patient Location: PACU  Anesthesia Type:General  Level of Consciousness: oriented, drowsy and patient cooperative  Airway & Oxygen Therapy: Patient Spontanous Breathing and Patient connected to nasal cannula oxygen  Post-op Assessment: Report given to RN and Post -op Vital signs reviewed and stable  Post vital signs: Reviewed  Last Vitals:  Vitals Value Taken Time  BP 101/67 10/13/21 1314  Temp    Pulse 80 10/13/21 1320  Resp 15 10/13/21 1320  SpO2 100 % 10/13/21 1320  Vitals shown include unvalidated device data.  Last Pain:  Vitals:   10/13/21 0736  TempSrc:   PainSc: 0-No pain         Complications: No notable events documented.

## 2021-10-13 NOTE — Anesthesia Procedure Notes (Signed)
Arterial Line Insertion Start/End2/04/2022 9:04 AM, 10/13/2021 9:11 AM Performed by: Val Eagle, MD, anesthesiologist  Patient location: OR. Preanesthetic checklist: patient identified, IV checked, site marked, risks and benefits discussed, surgical consent, monitors and equipment checked, pre-op evaluation, timeout performed and anesthesia consent Left, radial was placed Catheter size: 20 G Hand hygiene performed  and maximum sterile barriers used   Attempts: 1 Procedure performed without using ultrasound guided technique. Following insertion, dressing applied and Biopatch. Post procedure assessment: normal and unchanged  Patient tolerated the procedure well with no immediate complications.

## 2021-10-13 NOTE — Progress Notes (Signed)
Referring Physician(s): Dr Rolin Barry   Supervising Physician: Baldemar Lenis  Patient Status:  [facility] - In-pt  Chief Complaint:  Rt side tinnitus  Subjective:  DIAGNOSTIC CEREBRAL ANGIOGRAM AND ENDOVASCULAR DAVF EMBOLIZATION Findings: Redemonstrated right sigmoid sinus multi hole dAVF. Endovascular embolization performed with coils placed in the draining venous sinus pocket with complete obliteration. No evidence of thromboembolic or hemorrhagic complication.  Pt in PACU Doing well Uncomfortable lying still on back Denies right ear tinnitus at this point Denies headache   Allergies: Erythromycin and Penicillins  Medications: Prior to Admission medications   Medication Sig Start Date End Date Taking? Authorizing Provider  ALPRAZolam Prudy Feeler) 0.5 MG tablet Take 0.5 mg by mouth 3 (three) times daily as needed for anxiety.   Yes [provider]  cetirizine (ZYRTEC) 10 MG tablet Take 10 mg by mouth daily as needed for allergies.   Yes [provider]  cholecalciferol (VITAMIN D3) 25 MCG (1000 UNIT) tablet Take 1,000 Units by mouth daily.   Yes [provider]  cyproheptadine (PERIACTIN) 4 MG tablet Take 4 mg by mouth at bedtime as needed for allergies. 09/29/15  Yes [provider]  ibuprofen (ADVIL) 200 MG tablet Take 600 mg by mouth every 6 (six) hours as needed for moderate pain or headache.   Yes [provider]  thyroid (ARMOUR) 120 MG tablet Take 120 mg by mouth daily.   Yes [provider]  tretinoin (RETIN-A) 0.025 % cream Apply 1 application topically at bedtime.   Yes [provider]  albuterol (VENTOLIN HFA) 108 (90 Base) MCG/ACT inhaler Inhale 2 puffs into the lungs every 6 (six) hours as needed for wheezing or shortness of breath.    [provider]  levonorgestrel (MIRENA) 20 MCG/24HR IUD 1 each by Intrauterine route once.      [provider]  MELATONIN-MAGNESIUM CITRATE  PO Take 1 Dose by mouth at bedtime as needed (sleep). 1 dose = 2 teaspoons    [provider]  valACYclovir (VALTREX) 1000 MG tablet Take 1,000 mg by mouth 2 (two) times daily as needed (fever blisters).    [provider]     Vital Signs: BP 102/68 (BP Location: Right Arm)    Pulse 77    Temp 98.5 F (36.9 C)    Resp 15    Ht 5' 0.5" (1.537 m)    Wt 102 lb 8 oz (46.5 kg)    SpO2 100%    BMI 19.69 kg/m   A/O Face symmetrical Seems comfortable In NAD Moves all 4s Rt groin NT no bleeding No hematoma Rt foot good pulse by doppler  Dr Quay Burow has seen pt and examined pt  Imaging: No results found.  Labs:  CBC: Recent Labs    09/24/21 1124 10/13/21 0741  WBC 5.0 5.5  HGB 15.1* 15.2*  HCT 47.1* 45.8  PLT 152 145*    COAGS: Recent Labs    09/24/21 1220 10/13/21 0741  INR 1.1 1.1    BMP: Recent Labs    09/24/21 1124 10/13/21 0741  NA 138 137  K 4.2 4.3  CL 101 103  CO2 28 25  GLUCOSE 90 94  BUN 10 14  CALCIUM 9.7 9.6  CREATININE 0.67 0.71  GFRNONAA >60 >60    LIVER FUNCTION TESTS: No results for input(s): BILITOT, AST, ALT, ALKPHOS, PROT, ALBUMIN in the last 8760 hours.  Assessment and Plan:  Post DAVF embolization To be transferred to Neuro ICU soon  Hopeful DC in am if stable  Electronically Signed: Robet Leu, PA-C 10/13/2021, 2:22 PM   I spent a total of 15 Minutes at the the patient's bedside AND on the patient's hospital floor or unit, greater than 50% of which was counseling/coordinating care for D AVF embolization

## 2021-10-13 NOTE — Sedation Documentation (Signed)
ACT 251 notified Dr Quay Burow.

## 2021-10-13 NOTE — Sedation Documentation (Signed)
ACT 311 notified Dr Rodrigues.  °

## 2021-10-13 NOTE — Sedation Documentation (Signed)
ACT 293 notified Dr Quay Burow

## 2021-10-13 NOTE — Sedation Documentation (Signed)
ACT 317 notified Dr Debbrah Alar

## 2021-10-13 NOTE — Sedation Documentation (Signed)
ACT 311 notified Dr Quay Burow.

## 2021-10-13 NOTE — Anesthesia Preprocedure Evaluation (Addendum)
Anesthesia Evaluation  Patient identified by MRN, date of birth, ID band Patient awake    Reviewed: Allergy & Precautions, NPO status , Patient's Chart, lab work & pertinent test results  History of Anesthesia Complications Negative for: history of anesthetic complications  Airway Mallampati: I  TM Distance: >3 FB Neck ROM: Full    Dental  (+) Teeth Intact, Dental Advisory Given   Pulmonary neg shortness of breath, asthma , neg sleep apnea, neg COPD, neg recent URI,    breath sounds clear to auscultation       Cardiovascular negative cardio ROS   Rhythm:Regular     Neuro/Psych  Headaches, PSYCHIATRIC DISORDERS Anxiety DURAL AV FISTULA    GI/Hepatic Neg liver ROS, GERD  ,  Endo/Other  Hypothyroidism   Renal/GU negative Renal ROS     Musculoskeletal negative musculoskeletal ROS (+)   Abdominal   Peds  Hematology negative hematology ROS (+) Lab Results      Component                Value               Date                      WBC                      5.5                 10/13/2021                HGB                      15.2 (H)            10/13/2021                HCT                      45.8                10/13/2021                MCV                      96.6                10/13/2021                PLT                      145 (L)             10/13/2021              Anesthesia Other Findings   Reproductive/Obstetrics Lab Results      Component                Value               Date                      PREGTESTUR               NEGATIVE            10/13/2021  Anesthesia Physical Anesthesia Plan  ASA: 2  Anesthesia Plan: General   Post-op Pain Management: Minimal or no pain anticipated   Induction: Intravenous  PONV Risk Score and Plan: 3 and Ondansetron and Dexamethasone  Airway Management Planned: Oral ETT  Additional Equipment:  Arterial line  Intra-op Plan:   Post-operative Plan: Extubation in OR  Informed Consent: I have reviewed the patients History and Physical, chart, labs and discussed the procedure including the risks, benefits and alternatives for the proposed anesthesia with the patient or authorized representative who has indicated his/her understanding and acceptance.     Dental advisory given  Plan Discussed with: CRNA and Anesthesiologist  Anesthesia Plan Comments:         Anesthesia Quick Evaluation

## 2021-10-14 ENCOUNTER — Encounter (HOSPITAL_COMMUNITY): Payer: Self-pay | Admitting: Neuroradiology

## 2021-10-14 MED ORDER — METHYLPREDNISOLONE 4 MG PO TBPK: ORAL_TABLET | ORAL | 0 refills | Status: AC

## 2021-10-14 MED ORDER — IBUPROFEN 200 MG PO TABS: 400.0000 mg | ORAL_TABLET | Freq: Once | ORAL | Status: AC

## 2021-10-14 MED ORDER — HYDROCORTISONE SOD SUC (PF) 100 MG IJ SOLR
INTRAMUSCULAR | Status: AC
  Administered 2021-10-14: 100 mg via INTRAVENOUS
  Filled 2021-10-14: qty 2

## 2021-10-14 MED ORDER — HYDROCORTISONE SOD SUC (PF) 100 MG IJ SOLR: 100.0000 mg | Freq: Once | INTRAMUSCULAR | Status: AC

## 2021-10-14 MED ORDER — IBUPROFEN 200 MG PO TABS
ORAL_TABLET | ORAL | Status: AC
  Administered 2021-10-14: 400 mg via ORAL
  Filled 2021-10-14: qty 2

## 2021-10-14 NOTE — Discharge Summary (Signed)
Patient ID: Cassandra Jefferson MRN: [id number] DOB/AGE: [id number] y.o.  Admit date: 10/13/2021 Discharge date: 10/14/2021  Supervising Physician: Pedro Earls  Patient Status: [facility] - In-pt  Admission Diagnoses: Dural AV Fistula  Discharge Diagnoses:  Principal Problem:   DAVF (dural arteriovenous fistula)   Discharged Condition: good  Hospital Course: Patient with a history of DAVF who presented to the [facility] Neuro Interventional Radiology department for an image-guided cerebral angiogram and endovascular DAVF embolization. She was admitted to the Neuro ICU for planned overnight observation and with the exception of a strong headache she had an uneventful night. This morning she states her headache is better and seemed to respond best to ibuprofen and hydrocortisone. Dr. Karenann Cai explained that her headaches are likely due to inflammation from the procedure yesterday and the new changes in cerebral blood flow.   The patient has been out of bed, has voided and has had something to eat and drink. She will be prescribed a Medrol dose pack at discharge and she was encouraged to take 200 mg ibuprofen and/or 325 mg aspirin every 4-6 hours as needed for headache. She was advised to avoid bending, stooping or lifting great than 10 lb for one week and to not submerge the vascular groin site in water for one week. Ok to shower when she gets home.    She will follow up with Dr. Karenann Cai in approximately 6 months. A scheduler from our office will call her with a date/time. The patient knows she can call our office for an earlier appointment if she has worsening headaches or other concerns.   Consults: None  Significant Diagnostic Studies: IR Transcath/Emboliz  Result Date: 10/13/2021 INDICATION: 41 year old female with past medical history significant for chronic headache, IBS and thyroid disease presenting with pulsatile tinnitus with significant impact in  her quality of life. MR angiogram performed September 10, 2021 was suggestive of a right transverse/sigmoid sinus dural AV fistula. 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 EXAM: ULTRASOUND-GUIDED VASCULAR ACCESS DIAGNOSTIC CEREBRAL ANGIOGRAM ENDOVASCULAR EMBOLIZATION OF DURAL ARTERIOVENOUS FISTULA FLAT PANEL HEAD CT COMPARISON:  Cerebral angiogram September 24, 2021. MEDICATIONS: Vancomycin 1 gm IV. The antibiotic was administered within 1 hour of the procedure. ANESTHESIA/SEDATION: The procedure was performed under general anesthesia. CONTRAST:  125 mL Omnipaque 300 milligram/mL. FLUOROSCOPY: Radiation Exposure Index (as provided by the fluoroscopic device): 1,209 mGy Kerma. COMPLICATIONS: None immediate. TECHNIQUE: Informed written consent was obtained from the patient after a thorough discussion of the procedural risks, benefits and alternatives. All questions were addressed. Maximal Sterile Barrier Technique was utilized including caps, mask, sterile gowns, sterile gloves, sterile drape, hand hygiene and skin antiseptic. A timeout was performed prior to the initiation of the procedure. The right groin was prepped and draped in the usual sterile fashion. Using a micropuncture kit and the modified Seldinger technique, access was gained to the right common femoral artery and a 6 French sheath was placed. Real-time ultrasound guidance was utilized for vascular access including the acquisition of a permanent ultrasound image documenting patency of the accessed vessel. Next, using a micropuncture kit and the modified Seldinger technique, access was gained to the right common femoral vein and an 8 French sheath was placed. Real-time ultrasound guidance was utilized for vascular access including the acquisition of a permanent ultrasound image documenting patency of the accessed vessel. Through the arterial access and under fluoroscopy,  a  4 [place] Berenstein 2 catheter was navigated over a 0.035" Terumo Glidewire into the aortic arch. The catheter was placed into the right common carotid artery and then advanced into the right internal carotid artery. Frontal and lateral angiograms of the head were obtained. The catheter was then retracted into the right external carotid artery and under fluoroscopic guidance, advanced into the left posterior auricular artery. Frontal and lateral angiograms of the head were obtained. The catheter was then placed into the internal maxillary artery. Frontal and lateral angiograms of the head were obtained. Finally, the catheter was placed into the left occipital artery. Frontal and lateral angiograms of the head were obtained. Through venous access and under fluoroscopy, an 8 [place] Neuron Max guide catheter was navigated over a 6 [place] Berenstein 2 catheter and a 0.035" Terumo Glidewire into the superior vena cava. The catheter was placed into the right internal jugular vein and then advanced into the right jugular bulb. The diagnostic catheter was removed. Frontal and lateral venograms of the skull base were obtained. FINDINGS: 1. Normal caliber of the right common femoral artery, adequate for vascular access. 2. Normal caliber of the right common femoral artery, adequate for vascular access. 3. Right internal carotid artery angiograms showed brisk contrast opacification of the right MCA and ACA vascular trees a right sigmoid sinus fistula is seen supplied by the lateral tentorial artery. Physiological venous drainage through the left transverse/sigmoid sinus with fistula draining into the right sigmoid sinus. 4. Right posterior auricular artery angiograms showed dural AV fistula supplied by posterior auricular artery branches at the right transverse-sigmoid junction with retrograde flow into the contralateral transverse sinus. 5. Right internal maxillary artery angiograms showed dural AV fistula supplied by  petrous and petrosquamous branches for of the right middle meningeal artery in draining into the transverse-sigmoid sinus junction noting retrograde flow into the left transverse sinus. 6. Right occipital artery angiograms showed dural AV fistula supplied by branches of the occipital artery draining into the right transverse-sigmoid junction as well as in the sigmoid sinus near the jugular bulb. Retrograde flow into the torcular, proximal superior sagittal sinus and left transverse sinus are noted. 7. There appear to be a separate channel of the right sigmoid sinus draining the fistula that is partially separated from the main sinus lumen. 8. Right jugular bulb venogram confirms location of the catheter within the jugular bulb with rapid washout. Prominent posterior cervical ectatic veins are noted. PROCEDURE: Through the arterial catheter placed in the right occipital artery, magnified frontal and lateral views of the head were obtained centered on the posterior fossa. Images were utilized as roadmap. Through the neuron max, a scepter balloon catheter was navigated over an Aristotle 14 micro guidewire into the right sigmoid sinus. Except the balloon was inflated and angiograms were obtained via right occipital artery contrast injection with frontal and lateral views the working projections. Angiogram confirmed the presence of 2 separate venous sinus channels with fistula draining into the smaller, most posteriorly located channel. Using biplane roadmap, an SL 10 microcatheter was navigated over a synchro support microguidewire into the right sigmoid posterior channel. Magnified angiograms with frontal and lateral views confirm the presence of the microcatheter within the separate channel. There are, coil embolization of this channel was performed via SL 10 microcatheter. Control angiograms were obtained after coil placement. A 3 mm x 6 cm 3D Axium coil, a 5 mm x 15 cm 3 Axium coil, a 2 mm x 8 cm helix Axium Prime  coil, a 2.5  mm x 6 cm 3D Axium Prime coil, a 3 mm x 10 cm helix Axium Prime coil, a 3 mm x 10 cm you leaks Axium Prime coil, a 5 mm x 15 cm 3D Axium coil, a 6 mm x 20 cm 3D Axium coil, a 6 mm x 20 cm 3D Axium coil, a 7 mm x 30 cm 3D Axium Prime coil and a 5 mm x 20 cm 3D Axium Prime coil were implanted. An attempt to implant a 5 mm x 20 cm 3D Axium Prime coil resulted in coil stretching. This coil with the corresponding microcatheter were then retracted and removed. Then, using biplane roadmap guidance, a headway duo was navigated over a synchro 2 micro guidewire into the right sigmoid sinus posterior pouch where it coiling was greater the performed as described above. Additional coils were placed followed by control angiograms obtained via right occipital artery contrast injection with frontal and lateral views of the head. A 2 mm x 4 cm helix Axium Prime coil, a 3 mm x 8 cm 3D Axium Prime coil, a 2.5 mm x 4 cm 3D Axium Prime coil and a 2 mm x 3 cm helix Axium Prime coil were implanted. Right occipital artery angiograms with magnified frontal and lateral views of the head no longer shows early venous drainage into the right sigmoid sinus. Faint delayed contrast opacification at the transverse-sigmoid sinus is noted which appear to represent minimal residual fistula, likely representing less than 5% of the original fistula. The Berenstein 2 catheter was placed into the right internal maxillary artery. Angiogram showed no opacification of the right middle meningeal artery, except for its origin, likely related to stasis. Then, the catheter was placed into the right internal carotid artery angiograms show brisk opacification of the right ACA and MCA vascular tree without evidence of thromboembolic complication. No early opacification of the right sigmoid sinus seen via lateral tentorial artery which appear decreased in caliber compared to pre embolization angiogram. Physiological anterograde flow through the right  transverse and sigmoid sinus is seen. The catheter was subsequently retracted into the innominate artery and advanced into the right subclavian artery and then into the cervical right vertebral artery. Frontal and lateral angiograms of the head were obtained. Early opacification of the right sigmoid sinus via posterior meningeal artery described on prior angiogram is no longer seen. Physiological anterograde flow through the right transverse and sigmoid sinus is seen. The arterial and venous catheters were subsequently withdrawn. Flat panel CT of the head was obtained and post processed in a separate workstation with concurrent attending physician supervision. Selected images were sent to PACS. No evidence of hemorrhagic complication noted. Right common femoral artery angiogram was obtained in right anterior oblique view. The puncture is at the level of the common femoral artery. The artery has normal caliber, adequate for closure device. A 5 [place] Exoseal was utilized for arterial access closure. The venous sheath was removed. Manual pressure was held over the arterial and venous access site for approximately 20 minutes. Adequate hemostasis was achieved. IMPRESSION: 1. 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 Minimal residual fistula may be present at the transverse-sigmoid junction with delayed venous opacification, likely representing less than 5% of original fistula. 2. No evidence of thromboembolic or hemorrhagic complication on final angiograms and flat panel CT. PLAN: Patient transferred to ICU for overnight observation. Electronically Signed   By: Erven Colla  de Sindy Messing M.D.   On: 10/13/2021 16:50   IR Angiogram Follow Up Study  Result Date: 10/13/2021 INDICATION: 41 year old female with past medical history significant for chronic  headache, IBS and thyroid disease presenting with pulsatile tinnitus with significant impact in her quality of life. MR angiogram performed September 10, 2021 was suggestive of a right transverse/sigmoid sinus dural AV fistula. 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 EXAM: ULTRASOUND-GUIDED VASCULAR ACCESS DIAGNOSTIC CEREBRAL ANGIOGRAM ENDOVASCULAR EMBOLIZATION OF DURAL ARTERIOVENOUS FISTULA FLAT PANEL HEAD CT COMPARISON:  Cerebral angiogram September 24, 2021. MEDICATIONS: Vancomycin 1 gm IV. The antibiotic was administered within 1 hour of the procedure. ANESTHESIA/SEDATION: The procedure was performed under general anesthesia. CONTRAST:  125 mL Omnipaque 300 milligram/mL. FLUOROSCOPY: Radiation Exposure Index (as provided by the fluoroscopic device): 1,209 mGy Kerma. COMPLICATIONS: None immediate. TECHNIQUE: Informed written consent was obtained from the patient after a thorough discussion of the procedural risks, benefits and alternatives. All questions were addressed. Maximal Sterile Barrier Technique was utilized including caps, mask, sterile gowns, sterile gloves, sterile drape, hand hygiene and skin antiseptic. A timeout was performed prior to the initiation of the procedure. The right groin was prepped and draped in the usual sterile fashion. Using a micropuncture kit and the modified Seldinger technique, access was gained to the right common femoral artery and a 6 French sheath was placed. Real-time ultrasound guidance was utilized for vascular access including the acquisition of a permanent ultrasound image documenting patency of the accessed vessel. Next, using a micropuncture kit and the modified Seldinger technique, access was gained to the right common femoral vein and an 8 French sheath was placed. Real-time ultrasound guidance was utilized for vascular access including the acquisition of a permanent ultrasound  image documenting patency of the accessed vessel. Through the arterial access and under fluoroscopy, a 4 [place] Berenstein 2 catheter was navigated over a 0.035" Terumo Glidewire into the aortic arch. The catheter was placed into the right common carotid artery and then advanced into the right internal carotid artery. Frontal and lateral angiograms of the head were obtained. The catheter was then retracted into the right external carotid artery and under fluoroscopic guidance, advanced into the left posterior auricular artery. Frontal and lateral angiograms of the head were obtained. The catheter was then placed into the internal maxillary artery. Frontal and lateral angiograms of the head were obtained. Finally, the catheter was placed into the left occipital artery. Frontal and lateral angiograms of the head were obtained. Through venous access and under fluoroscopy, an 8 [place] Neuron Max guide catheter was navigated over a 6 [place] Berenstein 2 catheter and a 0.035" Terumo Glidewire into the superior vena cava. The catheter was placed into the right internal jugular vein and then advanced into the right jugular bulb. The diagnostic catheter was removed. Frontal and lateral venograms of the skull base were obtained. FINDINGS: 1. Normal caliber of the right common femoral artery, adequate for vascular access. 2. Normal caliber of the right common femoral artery, adequate for vascular access. 3. Right internal carotid artery angiograms showed brisk contrast opacification of the right MCA and ACA vascular trees a right sigmoid sinus fistula is seen supplied by the lateral tentorial artery. Physiological venous drainage through the left transverse/sigmoid sinus with fistula draining into the right sigmoid sinus. 4. Right posterior auricular artery angiograms showed dural AV fistula supplied by posterior auricular artery branches at the right transverse-sigmoid junction with  retrograde flow into the contralateral  transverse sinus. 5. Right internal maxillary artery angiograms showed dural AV fistula supplied by petrous and petrosquamous branches for of the right middle meningeal artery in draining into the transverse-sigmoid sinus junction noting retrograde flow into the left transverse sinus. 6. Right occipital artery angiograms showed dural AV fistula supplied by branches of the occipital artery draining into the right transverse-sigmoid junction as well as in the sigmoid sinus near the jugular bulb. Retrograde flow into the torcular, proximal superior sagittal sinus and left transverse sinus are noted. 7. There appear to be a separate channel of the right sigmoid sinus draining the fistula that is partially separated from the main sinus lumen. 8. Right jugular bulb venogram confirms location of the catheter within the jugular bulb with rapid washout. Prominent posterior cervical ectatic veins are noted. PROCEDURE: Through the arterial catheter placed in the right occipital artery, magnified frontal and lateral views of the head were obtained centered on the posterior fossa. Images were utilized as roadmap. Through the neuron max, a scepter balloon catheter was navigated over an Aristotle 14 micro guidewire into the right sigmoid sinus. Except the balloon was inflated and angiograms were obtained via right occipital artery contrast injection with frontal and lateral views the working projections. Angiogram confirmed the presence of 2 separate venous sinus channels with fistula draining into the smaller, most posteriorly located channel. Using biplane roadmap, an SL 10 microcatheter was navigated over a synchro support microguidewire into the right sigmoid posterior channel. Magnified angiograms with frontal and lateral views confirm the presence of the microcatheter within the separate channel. There are, coil embolization of this channel was performed via SL 10 microcatheter. Control angiograms were obtained after coil  placement. A 3 mm x 6 cm 3D Axium coil, a 5 mm x 15 cm 3 Axium coil, a 2 mm x 8 cm helix Axium Prime coil, a 2.5 mm x 6 cm 3D Axium Prime coil, a 3 mm x 10 cm helix Axium Prime coil, a 3 mm x 10 cm you leaks Axium Prime coil, a 5 mm x 15 cm 3D Axium coil, a 6 mm x 20 cm 3D Axium coil, a 6 mm x 20 cm 3D Axium coil, a 7 mm x 30 cm 3D Axium Prime coil and a 5 mm x 20 cm 3D Axium Prime coil were implanted. An attempt to implant a 5 mm x 20 cm 3D Axium Prime coil resulted in coil stretching. This coil with the corresponding microcatheter were then retracted and removed. Then, using biplane roadmap guidance, a headway duo was navigated over a synchro 2 micro guidewire into the right sigmoid sinus posterior pouch where it coiling was greater the performed as described above. Additional coils were placed followed by control angiograms obtained via right occipital artery contrast injection with frontal and lateral views of the head. A 2 mm x 4 cm helix Axium Prime coil, a 3 mm x 8 cm 3D Axium Prime coil, a 2.5 mm x 4 cm 3D Axium Prime coil and a 2 mm x 3 cm helix Axium Prime coil were implanted. Right occipital artery angiograms with magnified frontal and lateral views of the head no longer shows early venous drainage into the right sigmoid sinus. Faint delayed contrast opacification at the transverse-sigmoid sinus is noted which appear to represent minimal residual fistula, likely representing less than 5% of the original fistula. The Berenstein 2 catheter was placed into the right internal maxillary artery. Angiogram showed no opacification of  the right middle meningeal artery, except for its origin, likely related to stasis. Then, the catheter was placed into the right internal carotid artery angiograms show brisk opacification of the right ACA and MCA vascular tree without evidence of thromboembolic complication. No early opacification of the right sigmoid sinus seen via lateral tentorial artery which appear decreased in  caliber compared to pre embolization angiogram. Physiological anterograde flow through the right transverse and sigmoid sinus is seen. The catheter was subsequently retracted into the innominate artery and advanced into the right subclavian artery and then into the cervical right vertebral artery. Frontal and lateral angiograms of the head were obtained. Early opacification of the right sigmoid sinus via posterior meningeal artery described on prior angiogram is no longer seen. Physiological anterograde flow through the right transverse and sigmoid sinus is seen. The arterial and venous catheters were subsequently withdrawn. Flat panel CT of the head was obtained and post processed in a separate workstation with concurrent attending physician supervision. Selected images were sent to PACS. No evidence of hemorrhagic complication noted. Right common femoral artery angiogram was obtained in right anterior oblique view. The puncture is at the level of the common femoral artery. The artery has normal caliber, adequate for closure device. A 5 [place] Exoseal was utilized for arterial access closure. The venous sheath was removed. Manual pressure was held over the arterial and venous access site for approximately 20 minutes. Adequate hemostasis was achieved. IMPRESSION: 1. 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 Minimal residual fistula may be present at the transverse-sigmoid junction with delayed venous opacification, likely representing less than 5% of original fistula. 2. No evidence of thromboembolic or hemorrhagic complication on final angiograms and flat panel CT. PLAN: Patient transferred to ICU for overnight observation. Electronically Signed   By: Pedro Earls M.D.   On: 10/13/2021 16:50   IR Angiogram Follow Up Study  Result Date:  10/13/2021 INDICATION: 41 year old female with past medical history significant for chronic headache, IBS and thyroid disease presenting with pulsatile tinnitus with significant impact in her quality of life. MR angiogram performed September 10, 2021 was suggestive of a right transverse/sigmoid sinus dural AV fistula. 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 EXAM: ULTRASOUND-GUIDED VASCULAR ACCESS DIAGNOSTIC CEREBRAL ANGIOGRAM ENDOVASCULAR EMBOLIZATION OF DURAL ARTERIOVENOUS FISTULA FLAT PANEL HEAD CT COMPARISON:  Cerebral angiogram September 24, 2021. MEDICATIONS: Vancomycin 1 gm IV. The antibiotic was administered within 1 hour of the procedure. ANESTHESIA/SEDATION: The procedure was performed under general anesthesia. CONTRAST:  125 mL Omnipaque 300 milligram/mL. FLUOROSCOPY: Radiation Exposure Index (as provided by the fluoroscopic device): 1,209 mGy Kerma. COMPLICATIONS: None immediate. TECHNIQUE: Informed written consent was obtained from the patient after a thorough discussion of the procedural risks, benefits and alternatives. All questions were addressed. Maximal Sterile Barrier Technique was utilized including caps, mask, sterile gowns, sterile gloves, sterile drape, hand hygiene and skin antiseptic. A timeout was performed prior to the initiation of the procedure. The right groin was prepped and draped in the usual sterile fashion. Using a micropuncture kit and the modified Seldinger technique, access was gained to the right common femoral artery and a 6 French sheath was placed. Real-time ultrasound guidance was utilized for vascular access including the acquisition of a permanent ultrasound image documenting patency of the accessed vessel. Next, using a micropuncture  kit and the modified Seldinger technique, access was gained to the right common femoral vein and an 8 French sheath was placed. Real-time ultrasound  guidance was utilized for vascular access including the acquisition of a permanent ultrasound image documenting patency of the accessed vessel. Through the arterial access and under fluoroscopy, a 4 [place] Berenstein 2 catheter was navigated over a 0.035" Terumo Glidewire into the aortic arch. The catheter was placed into the right common carotid artery and then advanced into the right internal carotid artery. Frontal and lateral angiograms of the head were obtained. The catheter was then retracted into the right external carotid artery and under fluoroscopic guidance, advanced into the left posterior auricular artery. Frontal and lateral angiograms of the head were obtained. The catheter was then placed into the internal maxillary artery. Frontal and lateral angiograms of the head were obtained. Finally, the catheter was placed into the left occipital artery. Frontal and lateral angiograms of the head were obtained. Through venous access and under fluoroscopy, an 8 [place] Neuron Max guide catheter was navigated over a 6 [place] Berenstein 2 catheter and a 0.035" Terumo Glidewire into the superior vena cava. The catheter was placed into the right internal jugular vein and then advanced into the right jugular bulb. The diagnostic catheter was removed. Frontal and lateral venograms of the skull base were obtained. FINDINGS: 1. Normal caliber of the right common femoral artery, adequate for vascular access. 2. Normal caliber of the right common femoral artery, adequate for vascular access. 3. Right internal carotid artery angiograms showed brisk contrast opacification of the right MCA and ACA vascular trees a right sigmoid sinus fistula is seen supplied by the lateral tentorial artery. Physiological venous drainage through the left transverse/sigmoid sinus with fistula draining into the right sigmoid sinus. 4. Right posterior auricular artery angiograms showed dural AV fistula supplied by posterior auricular artery  branches at the right transverse-sigmoid junction with retrograde flow into the contralateral transverse sinus. 5. Right internal maxillary artery angiograms showed dural AV fistula supplied by petrous and petrosquamous branches for of the right middle meningeal artery in draining into the transverse-sigmoid sinus junction noting retrograde flow into the left transverse sinus. 6. Right occipital artery angiograms showed dural AV fistula supplied by branches of the occipital artery draining into the right transverse-sigmoid junction as well as in the sigmoid sinus near the jugular bulb. Retrograde flow into the torcular, proximal superior sagittal sinus and left transverse sinus are noted. 7. There appear to be a separate channel of the right sigmoid sinus draining the fistula that is partially separated from the main sinus lumen. 8. Right jugular bulb venogram confirms location of the catheter within the jugular bulb with rapid washout. Prominent posterior cervical ectatic veins are noted. PROCEDURE: Through the arterial catheter placed in the right occipital artery, magnified frontal and lateral views of the head were obtained centered on the posterior fossa. Images were utilized as roadmap. Through the neuron max, a scepter balloon catheter was navigated over an Aristotle 14 micro guidewire into the right sigmoid sinus. Except the balloon was inflated and angiograms were obtained via right occipital artery contrast injection with frontal and lateral views the working projections. Angiogram confirmed the presence of 2 separate venous sinus channels with fistula draining into the smaller, most posteriorly located channel. Using biplane roadmap, an SL 10 microcatheter was navigated over a synchro support microguidewire into the right sigmoid posterior channel. Magnified angiograms with frontal and lateral views confirm the presence of the microcatheter within the  separate channel. There are, coil embolization of this  channel was performed via SL 10 microcatheter. Control angiograms were obtained after coil placement. A 3 mm x 6 cm 3D Axium coil, a 5 mm x 15 cm 3 Axium coil, a 2 mm x 8 cm helix Axium Prime coil, a 2.5 mm x 6 cm 3D Axium Prime coil, a 3 mm x 10 cm helix Axium Prime coil, a 3 mm x 10 cm you leaks Axium Prime coil, a 5 mm x 15 cm 3D Axium coil, a 6 mm x 20 cm 3D Axium coil, a 6 mm x 20 cm 3D Axium coil, a 7 mm x 30 cm 3D Axium Prime coil and a 5 mm x 20 cm 3D Axium Prime coil were implanted. An attempt to implant a 5 mm x 20 cm 3D Axium Prime coil resulted in coil stretching. This coil with the corresponding microcatheter were then retracted and removed. Then, using biplane roadmap guidance, a headway duo was navigated over a synchro 2 micro guidewire into the right sigmoid sinus posterior pouch where it coiling was greater the performed as described above. Additional coils were placed followed by control angiograms obtained via right occipital artery contrast injection with frontal and lateral views of the head. A 2 mm x 4 cm helix Axium Prime coil, a 3 mm x 8 cm 3D Axium Prime coil, a 2.5 mm x 4 cm 3D Axium Prime coil and a 2 mm x 3 cm helix Axium Prime coil were implanted. Right occipital artery angiograms with magnified frontal and lateral views of the head no longer shows early venous drainage into the right sigmoid sinus. Faint delayed contrast opacification at the transverse-sigmoid sinus is noted which appear to represent minimal residual fistula, likely representing less than 5% of the original fistula. The Berenstein 2 catheter was placed into the right internal maxillary artery. Angiogram showed no opacification of the right middle meningeal artery, except for its origin, likely related to stasis. Then, the catheter was placed into the right internal carotid artery angiograms show brisk opacification of the right ACA and MCA vascular tree without evidence of thromboembolic complication. No early  opacification of the right sigmoid sinus seen via lateral tentorial artery which appear decreased in caliber compared to pre embolization angiogram. Physiological anterograde flow through the right transverse and sigmoid sinus is seen. The catheter was subsequently retracted into the innominate artery and advanced into the right subclavian artery and then into the cervical right vertebral artery. Frontal and lateral angiograms of the head were obtained. Early opacification of the right sigmoid sinus via posterior meningeal artery described on prior angiogram is no longer seen. Physiological anterograde flow through the right transverse and sigmoid sinus is seen. The arterial and venous catheters were subsequently withdrawn. Flat panel CT of the head was obtained and post processed in a separate workstation with concurrent attending physician supervision. Selected images were sent to PACS. No evidence of hemorrhagic complication noted. Right common femoral artery angiogram was obtained in right anterior oblique view. The puncture is at the level of the common femoral artery. The artery has normal caliber, adequate for closure device. A 5 [place] Exoseal was utilized for arterial access closure. The venous sheath was removed. Manual pressure was held over the arterial and venous access site for approximately 20 minutes. Adequate hemostasis was achieved. IMPRESSION: 1. 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 Minimal residual fistula may be present at the transverse-sigmoid junction with delayed venous opacification, likely representing less than 5% of original fistula. 2. No evidence of thromboembolic or hemorrhagic complication on final angiograms and flat panel CT. PLAN: Patient transferred to ICU for overnight observation. Electronically Signed   By:  Pedro Earls M.D.   On: 10/13/2021 16:50   IR Angiogram Follow Up Study  Result Date: 10/13/2021 INDICATION: 41 year old female with past medical history significant for chronic headache, IBS and thyroid disease presenting with pulsatile tinnitus with significant impact in her quality of life. MR angiogram performed September 10, 2021 was suggestive of a right transverse/sigmoid sinus dural AV fistula. 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 EXAM: ULTRASOUND-GUIDED VASCULAR ACCESS DIAGNOSTIC CEREBRAL ANGIOGRAM ENDOVASCULAR EMBOLIZATION OF DURAL ARTERIOVENOUS FISTULA FLAT PANEL HEAD CT COMPARISON:  Cerebral angiogram September 24, 2021. MEDICATIONS: Vancomycin 1 gm IV. The antibiotic was administered within 1 hour of the procedure. ANESTHESIA/SEDATION: The procedure was performed under general anesthesia. CONTRAST:  125 mL Omnipaque 300 milligram/mL. FLUOROSCOPY: Radiation Exposure Index (as provided by the fluoroscopic device): 1,209 mGy Kerma. COMPLICATIONS: None immediate. TECHNIQUE: Informed written consent was obtained from the patient after a thorough discussion of the procedural risks, benefits and alternatives. All questions were addressed. Maximal Sterile Barrier Technique was utilized including caps, mask, sterile gowns, sterile gloves, sterile drape, hand hygiene and skin antiseptic. A timeout was performed prior to the initiation of the procedure. The right groin was prepped and draped in the usual sterile fashion. Using a micropuncture kit and the modified Seldinger technique, access was gained to the right common femoral artery and a 6 French sheath was placed. Real-time ultrasound guidance was utilized for vascular access including the acquisition of a permanent ultrasound image documenting patency of the accessed vessel. Next, using a micropuncture kit and the modified Seldinger technique, access  was gained to the right common femoral vein and an 8 French sheath was placed. Real-time ultrasound guidance was utilized for vascular access including the acquisition of a permanent ultrasound image documenting patency of the accessed vessel. Through the arterial access and under fluoroscopy, a 4 [place] Berenstein 2 catheter was navigated over a 0.035" Terumo Glidewire into the aortic arch. The catheter was placed into the right common carotid artery and then advanced into the right internal carotid artery. Frontal and lateral angiograms of the head were obtained. The catheter was then retracted into the right external carotid artery and under fluoroscopic guidance, advanced into the left posterior auricular artery. Frontal and lateral angiograms of the head were obtained. The catheter was then placed into the internal maxillary artery. Frontal and lateral angiograms of the head were obtained. Finally, the catheter was placed into the left occipital artery. Frontal and lateral angiograms of the head were obtained. Through venous access and under fluoroscopy, an 8 [place] Neuron Max guide catheter was navigated over a 6 [place] Berenstein 2 catheter and a 0.035" Terumo Glidewire into the superior vena cava. The catheter was placed into the right internal jugular vein and then advanced into the right jugular bulb. The diagnostic catheter was removed. Frontal and lateral venograms of the skull base were obtained. FINDINGS: 1. Normal caliber of the right common femoral artery, adequate for vascular access. 2. Normal caliber of the right common femoral artery, adequate for vascular access. 3. Right internal carotid artery angiograms showed brisk contrast opacification of the right MCA and  ACA vascular trees a right sigmoid sinus fistula is seen supplied by the lateral tentorial artery. Physiological venous drainage through the left transverse/sigmoid sinus with fistula draining into the right sigmoid sinus. 4. Right  posterior auricular artery angiograms showed dural AV fistula supplied by posterior auricular artery branches at the right transverse-sigmoid junction with retrograde flow into the contralateral transverse sinus. 5. Right internal maxillary artery angiograms showed dural AV fistula supplied by petrous and petrosquamous branches for of the right middle meningeal artery in draining into the transverse-sigmoid sinus junction noting retrograde flow into the left transverse sinus. 6. Right occipital artery angiograms showed dural AV fistula supplied by branches of the occipital artery draining into the right transverse-sigmoid junction as well as in the sigmoid sinus near the jugular bulb. Retrograde flow into the torcular, proximal superior sagittal sinus and left transverse sinus are noted. 7. There appear to be a separate channel of the right sigmoid sinus draining the fistula that is partially separated from the main sinus lumen. 8. Right jugular bulb venogram confirms location of the catheter within the jugular bulb with rapid washout. Prominent posterior cervical ectatic veins are noted. PROCEDURE: Through the arterial catheter placed in the right occipital artery, magnified frontal and lateral views of the head were obtained centered on the posterior fossa. Images were utilized as roadmap. Through the neuron max, a scepter balloon catheter was navigated over an Aristotle 14 micro guidewire into the right sigmoid sinus. Except the balloon was inflated and angiograms were obtained via right occipital artery contrast injection with frontal and lateral views the working projections. Angiogram confirmed the presence of 2 separate venous sinus channels with fistula draining into the smaller, most posteriorly located channel. Using biplane roadmap, an SL 10 microcatheter was navigated over a synchro support microguidewire into the right sigmoid posterior channel. Magnified angiograms with frontal and lateral views  confirm the presence of the microcatheter within the separate channel. There are, coil embolization of this channel was performed via SL 10 microcatheter. Control angiograms were obtained after coil placement. A 3 mm x 6 cm 3D Axium coil, a 5 mm x 15 cm 3 Axium coil, a 2 mm x 8 cm helix Axium Prime coil, a 2.5 mm x 6 cm 3D Axium Prime coil, a 3 mm x 10 cm helix Axium Prime coil, a 3 mm x 10 cm you leaks Axium Prime coil, a 5 mm x 15 cm 3D Axium coil, a 6 mm x 20 cm 3D Axium coil, a 6 mm x 20 cm 3D Axium coil, a 7 mm x 30 cm 3D Axium Prime coil and a 5 mm x 20 cm 3D Axium Prime coil were implanted. An attempt to implant a 5 mm x 20 cm 3D Axium Prime coil resulted in coil stretching. This coil with the corresponding microcatheter were then retracted and removed. Then, using biplane roadmap guidance, a headway duo was navigated over a synchro 2 micro guidewire into the right sigmoid sinus posterior pouch where it coiling was greater the performed as described above. Additional coils were placed followed by control angiograms obtained via right occipital artery contrast injection with frontal and lateral views of the head. A 2 mm x 4 cm helix Axium Prime coil, a 3 mm x 8 cm 3D Axium Prime coil, a 2.5 mm x 4 cm 3D Axium Prime coil and a 2 mm x 3 cm helix Axium Prime coil were implanted. Right occipital artery angiograms with magnified frontal and lateral views of the head  no longer shows early venous drainage into the right sigmoid sinus. Faint delayed contrast opacification at the transverse-sigmoid sinus is noted which appear to represent minimal residual fistula, likely representing less than 5% of the original fistula. The Berenstein 2 catheter was placed into the right internal maxillary artery. Angiogram showed no opacification of the right middle meningeal artery, except for its origin, likely related to stasis. Then, the catheter was placed into the right internal carotid artery angiograms show brisk  opacification of the right ACA and MCA vascular tree without evidence of thromboembolic complication. No early opacification of the right sigmoid sinus seen via lateral tentorial artery which appear decreased in caliber compared to pre embolization angiogram. Physiological anterograde flow through the right transverse and sigmoid sinus is seen. The catheter was subsequently retracted into the innominate artery and advanced into the right subclavian artery and then into the cervical right vertebral artery. Frontal and lateral angiograms of the head were obtained. Early opacification of the right sigmoid sinus via posterior meningeal artery described on prior angiogram is no longer seen. Physiological anterograde flow through the right transverse and sigmoid sinus is seen. The arterial and venous catheters were subsequently withdrawn. Flat panel CT of the head was obtained and post processed in a separate workstation with concurrent attending physician supervision. Selected images were sent to PACS. No evidence of hemorrhagic complication noted. Right common femoral artery angiogram was obtained in right anterior oblique view. The puncture is at the level of the common femoral artery. The artery has normal caliber, adequate for closure device. A 5 [place] Exoseal was utilized for arterial access closure. The venous sheath was removed. Manual pressure was held over the arterial and venous access site for approximately 20 minutes. Adequate hemostasis was achieved. IMPRESSION: 1. 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 Minimal residual fistula may be present at the transverse-sigmoid junction with delayed venous opacification, likely representing less than 5% of original fistula. 2. No evidence of thromboembolic or hemorrhagic complication on final angiograms and  flat panel CT. PLAN: Patient transferred to ICU for overnight observation. Electronically Signed   By: Pedro Earls M.D.   On: 10/13/2021 16:50   IR Angiogram Follow Up Study  Result Date: 10/13/2021 INDICATION: 41 year old female with past medical history significant for chronic headache, IBS and thyroid disease presenting with pulsatile tinnitus with significant impact in her quality of life. MR angiogram performed September 10, 2021 was suggestive of a right transverse/sigmoid sinus dural AV fistula. 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 EXAM: ULTRASOUND-GUIDED VASCULAR ACCESS DIAGNOSTIC CEREBRAL ANGIOGRAM ENDOVASCULAR EMBOLIZATION OF DURAL ARTERIOVENOUS FISTULA FLAT PANEL HEAD CT COMPARISON:  Cerebral angiogram September 24, 2021. MEDICATIONS: Vancomycin 1 gm IV. The antibiotic was administered within 1 hour of the procedure. ANESTHESIA/SEDATION: The procedure was performed under general anesthesia. CONTRAST:  125 mL Omnipaque 300 milligram/mL. FLUOROSCOPY: Radiation Exposure Index (as provided by the fluoroscopic device): 1,209 mGy Kerma. COMPLICATIONS: None immediate. TECHNIQUE: Informed written consent was obtained from the patient after a thorough discussion of the procedural risks, benefits and alternatives. All questions were addressed. Maximal Sterile Barrier Technique was utilized including caps, mask, sterile gowns, sterile gloves, sterile drape, hand hygiene and skin antiseptic. A timeout was performed prior to the initiation of the procedure. The right groin was prepped and draped in the  usual sterile fashion. Using a micropuncture kit and the modified Seldinger technique, access was gained to the right common femoral artery and a 6 French sheath was placed. Real-time ultrasound guidance was utilized for vascular access including the acquisition of a permanent ultrasound image documenting  patency of the accessed vessel. Next, using a micropuncture kit and the modified Seldinger technique, access was gained to the right common femoral vein and an 8 French sheath was placed. Real-time ultrasound guidance was utilized for vascular access including the acquisition of a permanent ultrasound image documenting patency of the accessed vessel. Through the arterial access and under fluoroscopy, a 4 [place] Berenstein 2 catheter was navigated over a 0.035" Terumo Glidewire into the aortic arch. The catheter was placed into the right common carotid artery and then advanced into the right internal carotid artery. Frontal and lateral angiograms of the head were obtained. The catheter was then retracted into the right external carotid artery and under fluoroscopic guidance, advanced into the left posterior auricular artery. Frontal and lateral angiograms of the head were obtained. The catheter was then placed into the internal maxillary artery. Frontal and lateral angiograms of the head were obtained. Finally, the catheter was placed into the left occipital artery. Frontal and lateral angiograms of the head were obtained. Through venous access and under fluoroscopy, an 8 [place] Neuron Max guide catheter was navigated over a 6 [place] Berenstein 2 catheter and a 0.035" Terumo Glidewire into the superior vena cava. The catheter was placed into the right internal jugular vein and then advanced into the right jugular bulb. The diagnostic catheter was removed. Frontal and lateral venograms of the skull base were obtained. FINDINGS: 1. Normal caliber of the right common femoral artery, adequate for vascular access. 2. Normal caliber of the right common femoral artery, adequate for vascular access. 3. Right internal carotid artery angiograms showed brisk contrast opacification of the right MCA and ACA vascular trees a right sigmoid sinus fistula is seen supplied by the lateral tentorial artery. Physiological venous drainage  through the left transverse/sigmoid sinus with fistula draining into the right sigmoid sinus. 4. Right posterior auricular artery angiograms showed dural AV fistula supplied by posterior auricular artery branches at the right transverse-sigmoid junction with retrograde flow into the contralateral transverse sinus. 5. Right internal maxillary artery angiograms showed dural AV fistula supplied by petrous and petrosquamous branches for of the right middle meningeal artery in draining into the transverse-sigmoid sinus junction noting retrograde flow into the left transverse sinus. 6. Right occipital artery angiograms showed dural AV fistula supplied by branches of the occipital artery draining into the right transverse-sigmoid junction as well as in the sigmoid sinus near the jugular bulb. Retrograde flow into the torcular, proximal superior sagittal sinus and left transverse sinus are noted. 7. There appear to be a separate channel of the right sigmoid sinus draining the fistula that is partially separated from the main sinus lumen. 8. Right jugular bulb venogram confirms location of the catheter within the jugular bulb with rapid washout. Prominent posterior cervical ectatic veins are noted. PROCEDURE: Through the arterial catheter placed in the right occipital artery, magnified frontal and lateral views of the head were obtained centered on the posterior fossa. Images were utilized as roadmap. Through the neuron max, a scepter balloon catheter was navigated over an Aristotle 14 micro guidewire into the right sigmoid sinus. Except the balloon was inflated and angiograms were obtained via right occipital artery contrast injection with frontal and lateral views the working projections. Angiogram  confirmed the presence of 2 separate venous sinus channels with fistula draining into the smaller, most posteriorly located channel. Using biplane roadmap, an SL 10 microcatheter was navigated over a synchro support  microguidewire into the right sigmoid posterior channel. Magnified angiograms with frontal and lateral views confirm the presence of the microcatheter within the separate channel. There are, coil embolization of this channel was performed via SL 10 microcatheter. Control angiograms were obtained after coil placement. A 3 mm x 6 cm 3D Axium coil, a 5 mm x 15 cm 3 Axium coil, a 2 mm x 8 cm helix Axium Prime coil, a 2.5 mm x 6 cm 3D Axium Prime coil, a 3 mm x 10 cm helix Axium Prime coil, a 3 mm x 10 cm you leaks Axium Prime coil, a 5 mm x 15 cm 3D Axium coil, a 6 mm x 20 cm 3D Axium coil, a 6 mm x 20 cm 3D Axium coil, a 7 mm x 30 cm 3D Axium Prime coil and a 5 mm x 20 cm 3D Axium Prime coil were implanted. An attempt to implant a 5 mm x 20 cm 3D Axium Prime coil resulted in coil stretching. This coil with the corresponding microcatheter were then retracted and removed. Then, using biplane roadmap guidance, a headway duo was navigated over a synchro 2 micro guidewire into the right sigmoid sinus posterior pouch where it coiling was greater the performed as described above. Additional coils were placed followed by control angiograms obtained via right occipital artery contrast injection with frontal and lateral views of the head. A 2 mm x 4 cm helix Axium Prime coil, a 3 mm x 8 cm 3D Axium Prime coil, a 2.5 mm x 4 cm 3D Axium Prime coil and a 2 mm x 3 cm helix Axium Prime coil were implanted. Right occipital artery angiograms with magnified frontal and lateral views of the head no longer shows early venous drainage into the right sigmoid sinus. Faint delayed contrast opacification at the transverse-sigmoid sinus is noted which appear to represent minimal residual fistula, likely representing less than 5% of the original fistula. The Berenstein 2 catheter was placed into the right internal maxillary artery. Angiogram showed no opacification of the right middle meningeal artery, except for its origin, likely related to  stasis. Then, the catheter was placed into the right internal carotid artery angiograms show brisk opacification of the right ACA and MCA vascular tree without evidence of thromboembolic complication. No early opacification of the right sigmoid sinus seen via lateral tentorial artery which appear decreased in caliber compared to pre embolization angiogram. Physiological anterograde flow through the right transverse and sigmoid sinus is seen. The catheter was subsequently retracted into the innominate artery and advanced into the right subclavian artery and then into the cervical right vertebral artery. Frontal and lateral angiograms of the head were obtained. Early opacification of the right sigmoid sinus via posterior meningeal artery described on prior angiogram is no longer seen. Physiological anterograde flow through the right transverse and sigmoid sinus is seen. The arterial and venous catheters were subsequently withdrawn. Flat panel CT of the head was obtained and post processed in a separate workstation with concurrent attending physician supervision. Selected images were sent to PACS. No evidence of hemorrhagic complication noted. Right common femoral artery angiogram was obtained in right anterior oblique view. The puncture is at the level of the common femoral artery. The artery has normal caliber, adequate for closure device. A 5 [place] Exoseal was utilized  for arterial access closure. The venous sheath was removed. Manual pressure was held over the arterial and venous access site for approximately 20 minutes. Adequate hemostasis was achieved. IMPRESSION: 1. 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 Minimal residual fistula may be present at the transverse-sigmoid junction with delayed venous opacification, likely representing less than 5% of  original fistula. 2. No evidence of thromboembolic or hemorrhagic complication on final angiograms and flat panel CT. PLAN: Patient transferred to ICU for overnight observation. Electronically Signed   By: Pedro Earls M.D.   On: 10/13/2021 16:50   IR Angiogram Follow Up Study  Result Date: 10/13/2021 INDICATION: 41 year old female with past medical history significant for chronic headache, IBS and thyroid disease presenting with pulsatile tinnitus with significant impact in her quality of life. MR angiogram performed September 10, 2021 was suggestive of a right transverse/sigmoid sinus dural AV fistula. 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 EXAM: ULTRASOUND-GUIDED VASCULAR ACCESS DIAGNOSTIC CEREBRAL ANGIOGRAM ENDOVASCULAR EMBOLIZATION OF DURAL ARTERIOVENOUS FISTULA FLAT PANEL HEAD CT COMPARISON:  Cerebral angiogram September 24, 2021. MEDICATIONS: Vancomycin 1 gm IV. The antibiotic was administered within 1 hour of the procedure. ANESTHESIA/SEDATION: The procedure was performed under general anesthesia. CONTRAST:  125 mL Omnipaque 300 milligram/mL. FLUOROSCOPY: Radiation Exposure Index (as provided by the fluoroscopic device): 1,209 mGy Kerma. COMPLICATIONS: None immediate. TECHNIQUE: Informed written consent was obtained from the patient after a thorough discussion of the procedural risks, benefits and alternatives. All questions were addressed. Maximal Sterile Barrier Technique was utilized including caps, mask, sterile gowns, sterile gloves, sterile drape, hand hygiene and skin antiseptic. A timeout was performed prior to the initiation of the procedure. The right groin was prepped and draped in the usual sterile fashion. Using a micropuncture kit and the modified Seldinger technique, access was gained to the right common femoral artery and a 6 French sheath was placed. Real-time ultrasound guidance was  utilized for vascular access including the acquisition of a permanent ultrasound image documenting patency of the accessed vessel. Next, using a micropuncture kit and the modified Seldinger technique, access was gained to the right common femoral vein and an 8 French sheath was placed. Real-time ultrasound guidance was utilized for vascular access including the acquisition of a permanent ultrasound image documenting patency of the accessed vessel. Through the arterial access and under fluoroscopy, a 4 [place] Berenstein 2 catheter was navigated over a 0.035" Terumo Glidewire into the aortic arch. The catheter was placed into the right common carotid artery and then advanced into the right internal carotid artery. Frontal and lateral angiograms of the head were obtained. The catheter was then retracted into the right external carotid artery and under fluoroscopic guidance, advanced into the left posterior auricular artery. Frontal and lateral angiograms of the head were obtained. The catheter was then placed into the internal maxillary artery. Frontal and lateral angiograms of the head were obtained. Finally, the catheter was placed into the left occipital artery. Frontal and lateral angiograms of the head were obtained. Through venous access and under fluoroscopy, an 8 [place] Neuron Max guide catheter was navigated over a 6 [place] Berenstein 2 catheter and a 0.035" Terumo Glidewire into the superior vena cava. The catheter was placed into the right internal jugular vein and then advanced into the right jugular bulb. The diagnostic catheter  was removed. Frontal and lateral venograms of the skull base were obtained. FINDINGS: 1. Normal caliber of the right common femoral artery, adequate for vascular access. 2. Normal caliber of the right common femoral artery, adequate for vascular access. 3. Right internal carotid artery angiograms showed brisk contrast opacification of the right MCA and ACA vascular trees a right  sigmoid sinus fistula is seen supplied by the lateral tentorial artery. Physiological venous drainage through the left transverse/sigmoid sinus with fistula draining into the right sigmoid sinus. 4. Right posterior auricular artery angiograms showed dural AV fistula supplied by posterior auricular artery branches at the right transverse-sigmoid junction with retrograde flow into the contralateral transverse sinus. 5. Right internal maxillary artery angiograms showed dural AV fistula supplied by petrous and petrosquamous branches for of the right middle meningeal artery in draining into the transverse-sigmoid sinus junction noting retrograde flow into the left transverse sinus. 6. Right occipital artery angiograms showed dural AV fistula supplied by branches of the occipital artery draining into the right transverse-sigmoid junction as well as in the sigmoid sinus near the jugular bulb. Retrograde flow into the torcular, proximal superior sagittal sinus and left transverse sinus are noted. 7. There appear to be a separate channel of the right sigmoid sinus draining the fistula that is partially separated from the main sinus lumen. 8. Right jugular bulb venogram confirms location of the catheter within the jugular bulb with rapid washout. Prominent posterior cervical ectatic veins are noted. PROCEDURE: Through the arterial catheter placed in the right occipital artery, magnified frontal and lateral views of the head were obtained centered on the posterior fossa. Images were utilized as roadmap. Through the neuron max, a scepter balloon catheter was navigated over an Aristotle 14 micro guidewire into the right sigmoid sinus. Except the balloon was inflated and angiograms were obtained via right occipital artery contrast injection with frontal and lateral views the working projections. Angiogram confirmed the presence of 2 separate venous sinus channels with fistula draining into the smaller, most posteriorly located  channel. Using biplane roadmap, an SL 10 microcatheter was navigated over a synchro support microguidewire into the right sigmoid posterior channel. Magnified angiograms with frontal and lateral views confirm the presence of the microcatheter within the separate channel. There are, coil embolization of this channel was performed via SL 10 microcatheter. Control angiograms were obtained after coil placement. A 3 mm x 6 cm 3D Axium coil, a 5 mm x 15 cm 3 Axium coil, a 2 mm x 8 cm helix Axium Prime coil, a 2.5 mm x 6 cm 3D Axium Prime coil, a 3 mm x 10 cm helix Axium Prime coil, a 3 mm x 10 cm you leaks Axium Prime coil, a 5 mm x 15 cm 3D Axium coil, a 6 mm x 20 cm 3D Axium coil, a 6 mm x 20 cm 3D Axium coil, a 7 mm x 30 cm 3D Axium Prime coil and a 5 mm x 20 cm 3D Axium Prime coil were implanted. An attempt to implant a 5 mm x 20 cm 3D Axium Prime coil resulted in coil stretching. This coil with the corresponding microcatheter were then retracted and removed. Then, using biplane roadmap guidance, a headway duo was navigated over a synchro 2 micro guidewire into the right sigmoid sinus posterior pouch where it coiling was greater the performed as described above. Additional coils were placed followed by control angiograms obtained via right occipital artery contrast injection with frontal and lateral views of the head. A 2  mm x 4 cm helix Axium Prime coil, a 3 mm x 8 cm 3D Axium Prime coil, a 2.5 mm x 4 cm 3D Axium Prime coil and a 2 mm x 3 cm helix Axium Prime coil were implanted. Right occipital artery angiograms with magnified frontal and lateral views of the head no longer shows early venous drainage into the right sigmoid sinus. Faint delayed contrast opacification at the transverse-sigmoid sinus is noted which appear to represent minimal residual fistula, likely representing less than 5% of the original fistula. The Berenstein 2 catheter was placed into the right internal maxillary artery. Angiogram showed no  opacification of the right middle meningeal artery, except for its origin, likely related to stasis. Then, the catheter was placed into the right internal carotid artery angiograms show brisk opacification of the right ACA and MCA vascular tree without evidence of thromboembolic complication. No early opacification of the right sigmoid sinus seen via lateral tentorial artery which appear decreased in caliber compared to pre embolization angiogram. Physiological anterograde flow through the right transverse and sigmoid sinus is seen. The catheter was subsequently retracted into the innominate artery and advanced into the right subclavian artery and then into the cervical right vertebral artery. Frontal and lateral angiograms of the head were obtained. Early opacification of the right sigmoid sinus via posterior meningeal artery described on prior angiogram is no longer seen. Physiological anterograde flow through the right transverse and sigmoid sinus is seen. The arterial and venous catheters were subsequently withdrawn. Flat panel CT of the head was obtained and post processed in a separate workstation with concurrent attending physician supervision. Selected images were sent to PACS. No evidence of hemorrhagic complication noted. Right common femoral artery angiogram was obtained in right anterior oblique view. The puncture is at the level of the common femoral artery. The artery has normal caliber, adequate for closure device. A 5 [place] Exoseal was utilized for arterial access closure. The venous sheath was removed. Manual pressure was held over the arterial and venous access site for approximately 20 minutes. Adequate hemostasis was achieved. IMPRESSION: 1. 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 Minimal residual fistula may be present at the  transverse-sigmoid junction with delayed venous opacification, likely representing less than 5% of original fistula. 2. No evidence of thromboembolic or hemorrhagic complication on final angiograms and flat panel CT. PLAN: Patient transferred to ICU for overnight observation. Electronically Signed   By: Pedro Earls M.D.   On: 10/13/2021 16:50   IR Angiogram Follow Up Study  Result Date: 10/13/2021 INDICATION: 41 year old female with past medical history significant for chronic headache, IBS and thyroid disease presenting with pulsatile tinnitus with significant impact in her quality of life. MR angiogram performed September 10, 2021 was suggestive of a right transverse/sigmoid sinus dural AV fistula. 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 EXAM: ULTRASOUND-GUIDED VASCULAR ACCESS DIAGNOSTIC CEREBRAL ANGIOGRAM ENDOVASCULAR EMBOLIZATION OF DURAL ARTERIOVENOUS FISTULA FLAT PANEL HEAD CT COMPARISON:  Cerebral angiogram September 24, 2021. MEDICATIONS: Vancomycin 1 gm IV. The antibiotic was administered within 1 hour of the procedure. ANESTHESIA/SEDATION: The procedure was performed under general anesthesia. CONTRAST:  125 mL Omnipaque 300 milligram/mL. FLUOROSCOPY: Radiation Exposure Index (as provided by the fluoroscopic device): 1,209 mGy Kerma. COMPLICATIONS: None immediate. TECHNIQUE: Informed written consent was obtained from the patient after  a thorough discussion of the procedural risks, benefits and alternatives. All questions were addressed. Maximal Sterile Barrier Technique was utilized including caps, mask, sterile gowns, sterile gloves, sterile drape, hand hygiene and skin antiseptic. A timeout was performed prior to the initiation of the procedure. The right groin was prepped and draped in the usual sterile fashion. Using a micropuncture kit and the modified Seldinger technique, access was gained to the  right common femoral artery and a 6 French sheath was placed. Real-time ultrasound guidance was utilized for vascular access including the acquisition of a permanent ultrasound image documenting patency of the accessed vessel. Next, using a micropuncture kit and the modified Seldinger technique, access was gained to the right common femoral vein and an 8 French sheath was placed. Real-time ultrasound guidance was utilized for vascular access including the acquisition of a permanent ultrasound image documenting patency of the accessed vessel. Through the arterial access and under fluoroscopy, a 4 [place] Berenstein 2 catheter was navigated over a 0.035" Terumo Glidewire into the aortic arch. The catheter was placed into the right common carotid artery and then advanced into the right internal carotid artery. Frontal and lateral angiograms of the head were obtained. The catheter was then retracted into the right external carotid artery and under fluoroscopic guidance, advanced into the left posterior auricular artery. Frontal and lateral angiograms of the head were obtained. The catheter was then placed into the internal maxillary artery. Frontal and lateral angiograms of the head were obtained. Finally, the catheter was placed into the left occipital artery. Frontal and lateral angiograms of the head were obtained. Through venous access and under fluoroscopy, an 8 [place] Neuron Max guide catheter was navigated over a 6 [place] Berenstein 2 catheter and a 0.035" Terumo Glidewire into the superior vena cava. The catheter was placed into the right internal jugular vein and then advanced into the right jugular bulb. The diagnostic catheter was removed. Frontal and lateral venograms of the skull base were obtained. FINDINGS: 1. Normal caliber of the right common femoral artery, adequate for vascular access. 2. Normal caliber of the right common femoral artery, adequate for vascular access. 3. Right internal carotid artery  angiograms showed brisk contrast opacification of the right MCA and ACA vascular trees a right sigmoid sinus fistula is seen supplied by the lateral tentorial artery. Physiological venous drainage through the left transverse/sigmoid sinus with fistula draining into the right sigmoid sinus. 4. Right posterior auricular artery angiograms showed dural AV fistula supplied by posterior auricular artery branches at the right transverse-sigmoid junction with retrograde flow into the contralateral transverse sinus. 5. Right internal maxillary artery angiograms showed dural AV fistula supplied by petrous and petrosquamous branches for of the right middle meningeal artery in draining into the transverse-sigmoid sinus junction noting retrograde flow into the left transverse sinus. 6. Right occipital artery angiograms showed dural AV fistula supplied by branches of the occipital artery draining into the right transverse-sigmoid junction as well as in the sigmoid sinus near the jugular bulb. Retrograde flow into the torcular, proximal superior sagittal sinus and left transverse sinus are noted. 7. There appear to be a separate channel of the right sigmoid sinus draining the fistula that is partially separated from the main sinus lumen. 8. Right jugular bulb venogram confirms location of the catheter within the jugular bulb with rapid washout. Prominent posterior cervical ectatic veins are noted. PROCEDURE: Through the arterial catheter placed in the right occipital artery, magnified frontal and lateral views of the head were obtained centered  on the posterior fossa. Images were utilized as roadmap. Through the neuron max, a scepter balloon catheter was navigated over an Aristotle 14 micro guidewire into the right sigmoid sinus. Except the balloon was inflated and angiograms were obtained via right occipital artery contrast injection with frontal and lateral views the working projections. Angiogram confirmed the presence of 2  separate venous sinus channels with fistula draining into the smaller, most posteriorly located channel. Using biplane roadmap, an SL 10 microcatheter was navigated over a synchro support microguidewire into the right sigmoid posterior channel. Magnified angiograms with frontal and lateral views confirm the presence of the microcatheter within the separate channel. There are, coil embolization of this channel was performed via SL 10 microcatheter. Control angiograms were obtained after coil placement. A 3 mm x 6 cm 3D Axium coil, a 5 mm x 15 cm 3 Axium coil, a 2 mm x 8 cm helix Axium Prime coil, a 2.5 mm x 6 cm 3D Axium Prime coil, a 3 mm x 10 cm helix Axium Prime coil, a 3 mm x 10 cm you leaks Axium Prime coil, a 5 mm x 15 cm 3D Axium coil, a 6 mm x 20 cm 3D Axium coil, a 6 mm x 20 cm 3D Axium coil, a 7 mm x 30 cm 3D Axium Prime coil and a 5 mm x 20 cm 3D Axium Prime coil were implanted. An attempt to implant a 5 mm x 20 cm 3D Axium Prime coil resulted in coil stretching. This coil with the corresponding microcatheter were then retracted and removed. Then, using biplane roadmap guidance, a headway duo was navigated over a synchro 2 micro guidewire into the right sigmoid sinus posterior pouch where it coiling was greater the performed as described above. Additional coils were placed followed by control angiograms obtained via right occipital artery contrast injection with frontal and lateral views of the head. A 2 mm x 4 cm helix Axium Prime coil, a 3 mm x 8 cm 3D Axium Prime coil, a 2.5 mm x 4 cm 3D Axium Prime coil and a 2 mm x 3 cm helix Axium Prime coil were implanted. Right occipital artery angiograms with magnified frontal and lateral views of the head no longer shows early venous drainage into the right sigmoid sinus. Faint delayed contrast opacification at the transverse-sigmoid sinus is noted which appear to represent minimal residual fistula, likely representing less than 5% of the original fistula. The  Berenstein 2 catheter was placed into the right internal maxillary artery. Angiogram showed no opacification of the right middle meningeal artery, except for its origin, likely related to stasis. Then, the catheter was placed into the right internal carotid artery angiograms show brisk opacification of the right ACA and MCA vascular tree without evidence of thromboembolic complication. No early opacification of the right sigmoid sinus seen via lateral tentorial artery which appear decreased in caliber compared to pre embolization angiogram. Physiological anterograde flow through the right transverse and sigmoid sinus is seen. The catheter was subsequently retracted into the innominate artery and advanced into the right subclavian artery and then into the cervical right vertebral artery. Frontal and lateral angiograms of the head were obtained. Early opacification of the right sigmoid sinus via posterior meningeal artery described on prior angiogram is no longer seen. Physiological anterograde flow through the right transverse and sigmoid sinus is seen. The arterial and venous catheters were subsequently withdrawn. Flat panel CT of the head was obtained and post processed in a separate workstation with  concurrent attending physician supervision. Selected images were sent to PACS. No evidence of hemorrhagic complication noted. Right common femoral artery angiogram was obtained in right anterior oblique view. The puncture is at the level of the common femoral artery. The artery has normal caliber, adequate for closure device. A 5 [place] Exoseal was utilized for arterial access closure. The venous sheath was removed. Manual pressure was held over the arterial and venous access site for approximately 20 minutes. Adequate hemostasis was achieved. IMPRESSION: 1. 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 Minimal residual fistula may be present at the transverse-sigmoid junction with delayed venous opacification, likely representing less than 5% of original fistula. 2. No evidence of thromboembolic or hemorrhagic complication on final angiograms and flat panel CT. PLAN: Patient transferred to ICU for overnight observation. Electronically Signed   By: Pedro Earls M.D.   On: 10/13/2021 16:50   IR Angiogram Follow Up Study  Result Date: 10/13/2021 INDICATION: 41 year old female with past medical history significant for chronic headache, IBS and thyroid disease presenting with pulsatile tinnitus with significant impact in her quality of life. MR angiogram performed September 10, 2021 was suggestive of a right transverse/sigmoid sinus dural AV fistula. 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 EXAM: ULTRASOUND-GUIDED VASCULAR ACCESS DIAGNOSTIC CEREBRAL ANGIOGRAM ENDOVASCULAR EMBOLIZATION OF DURAL ARTERIOVENOUS FISTULA FLAT PANEL HEAD CT COMPARISON:  Cerebral angiogram September 24, 2021. MEDICATIONS: Vancomycin 1 gm IV. The antibiotic was administered within 1 hour of the procedure. ANESTHESIA/SEDATION: The procedure was performed under general anesthesia. CONTRAST:  125 mL Omnipaque 300 milligram/mL. FLUOROSCOPY: Radiation Exposure Index (as provided by the fluoroscopic device): 1,209 mGy Kerma. COMPLICATIONS: None immediate. TECHNIQUE: Informed written consent was obtained from the patient after a thorough discussion of the procedural risks, benefits and alternatives. All questions were addressed. Maximal Sterile Barrier Technique was utilized including caps, mask, sterile gowns, sterile gloves, sterile drape, hand hygiene and skin antiseptic. A timeout was performed prior to the initiation of the procedure. The right groin was prepped and draped in the usual sterile  fashion. Using a micropuncture kit and the modified Seldinger technique, access was gained to the right common femoral artery and a 6 French sheath was placed. Real-time ultrasound guidance was utilized for vascular access including the acquisition of a permanent ultrasound image documenting patency of the accessed vessel. Next, using a micropuncture kit and the modified Seldinger technique, access was gained to the right common femoral vein and an 8 French sheath was placed. Real-time ultrasound guidance was utilized for vascular access including the acquisition of a permanent ultrasound image documenting patency of the accessed vessel. Through the arterial access and under fluoroscopy, a 4 [place] Berenstein 2 catheter was navigated over a 0.035" Terumo Glidewire into the aortic arch. The catheter was placed into the right common carotid artery and then advanced into the right internal carotid artery. Frontal and lateral angiograms of the head were obtained. The catheter was then retracted into the right external carotid artery and under fluoroscopic guidance, advanced into the left posterior auricular artery. Frontal and lateral angiograms of the head were obtained. The catheter was then placed into the internal maxillary artery. Frontal and lateral angiograms of the head were obtained. Finally, the catheter was placed into the left occipital artery. Frontal and lateral angiograms of the head were  obtained. Through venous access and under fluoroscopy, an 8 [place] Neuron Max guide catheter was navigated over a 6 [place] Berenstein 2 catheter and a 0.035" Terumo Glidewire into the superior vena cava. The catheter was placed into the right internal jugular vein and then advanced into the right jugular bulb. The diagnostic catheter was removed. Frontal and lateral venograms of the skull base were obtained. FINDINGS: 1. Normal caliber of the right common femoral artery, adequate for vascular access. 2. Normal caliber of  the right common femoral artery, adequate for vascular access. 3. Right internal carotid artery angiograms showed brisk contrast opacification of the right MCA and ACA vascular trees a right sigmoid sinus fistula is seen supplied by the lateral tentorial artery. Physiological venous drainage through the left transverse/sigmoid sinus with fistula draining into the right sigmoid sinus. 4. Right posterior auricular artery angiograms showed dural AV fistula supplied by posterior auricular artery branches at the right transverse-sigmoid junction with retrograde flow into the contralateral transverse sinus. 5. Right internal maxillary artery angiograms showed dural AV fistula supplied by petrous and petrosquamous branches for of the right middle meningeal artery in draining into the transverse-sigmoid sinus junction noting retrograde flow into the left transverse sinus. 6. Right occipital artery angiograms showed dural AV fistula supplied by branches of the occipital artery draining into the right transverse-sigmoid junction as well as in the sigmoid sinus near the jugular bulb. Retrograde flow into the torcular, proximal superior sagittal sinus and left transverse sinus are noted. 7. There appear to be a separate channel of the right sigmoid sinus draining the fistula that is partially separated from the main sinus lumen. 8. Right jugular bulb venogram confirms location of the catheter within the jugular bulb with rapid washout. Prominent posterior cervical ectatic veins are noted. PROCEDURE: Through the arterial catheter placed in the right occipital artery, magnified frontal and lateral views of the head were obtained centered on the posterior fossa. Images were utilized as roadmap. Through the neuron max, a scepter balloon catheter was navigated over an Aristotle 14 micro guidewire into the right sigmoid sinus. Except the balloon was inflated and angiograms were obtained via right occipital artery contrast injection  with frontal and lateral views the working projections. Angiogram confirmed the presence of 2 separate venous sinus channels with fistula draining into the smaller, most posteriorly located channel. Using biplane roadmap, an SL 10 microcatheter was navigated over a synchro support microguidewire into the right sigmoid posterior channel. Magnified angiograms with frontal and lateral views confirm the presence of the microcatheter within the separate channel. There are, coil embolization of this channel was performed via SL 10 microcatheter. Control angiograms were obtained after coil placement. A 3 mm x 6 cm 3D Axium coil, a 5 mm x 15 cm 3 Axium coil, a 2 mm x 8 cm helix Axium Prime coil, a 2.5 mm x 6 cm 3D Axium Prime coil, a 3 mm x 10 cm helix Axium Prime coil, a 3 mm x 10 cm you leaks Axium Prime coil, a 5 mm x 15 cm 3D Axium coil, a 6 mm x 20 cm 3D Axium coil, a 6 mm x 20 cm 3D Axium coil, a 7 mm x 30 cm 3D Axium Prime coil and a 5 mm x 20 cm 3D Axium Prime coil were implanted. An attempt to implant a 5 mm x 20 cm 3D Axium Prime coil resulted in coil stretching. This coil with the corresponding microcatheter were then retracted and removed. Then, using biplane roadmap  guidance, a headway duo was navigated over a synchro 2 micro guidewire into the right sigmoid sinus posterior pouch where it coiling was greater the performed as described above. Additional coils were placed followed by control angiograms obtained via right occipital artery contrast injection with frontal and lateral views of the head. A 2 mm x 4 cm helix Axium Prime coil, a 3 mm x 8 cm 3D Axium Prime coil, a 2.5 mm x 4 cm 3D Axium Prime coil and a 2 mm x 3 cm helix Axium Prime coil were implanted. Right occipital artery angiograms with magnified frontal and lateral views of the head no longer shows early venous drainage into the right sigmoid sinus. Faint delayed contrast opacification at the transverse-sigmoid sinus is noted which appear to  represent minimal residual fistula, likely representing less than 5% of the original fistula. The Berenstein 2 catheter was placed into the right internal maxillary artery. Angiogram showed no opacification of the right middle meningeal artery, except for its origin, likely related to stasis. Then, the catheter was placed into the right internal carotid artery angiograms show brisk opacification of the right ACA and MCA vascular tree without evidence of thromboembolic complication. No early opacification of the right sigmoid sinus seen via lateral tentorial artery which appear decreased in caliber compared to pre embolization angiogram. Physiological anterograde flow through the right transverse and sigmoid sinus is seen. The catheter was subsequently retracted into the innominate artery and advanced into the right subclavian artery and then into the cervical right vertebral artery. Frontal and lateral angiograms of the head were obtained. Early opacification of the right sigmoid sinus via posterior meningeal artery described on prior angiogram is no longer seen. Physiological anterograde flow through the right transverse and sigmoid sinus is seen. The arterial and venous catheters were subsequently withdrawn. Flat panel CT of the head was obtained and post processed in a separate workstation with concurrent attending physician supervision. Selected images were sent to PACS. No evidence of hemorrhagic complication noted. Right common femoral artery angiogram was obtained in right anterior oblique view. The puncture is at the level of the common femoral artery. The artery has normal caliber, adequate for closure device. A 5 [place] Exoseal was utilized for arterial access closure. The venous sheath was removed. Manual pressure was held over the arterial and venous access site for approximately 20 minutes. Adequate hemostasis was achieved. IMPRESSION: 1. 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 Minimal residual fistula may be present at the transverse-sigmoid junction with delayed venous opacification, likely representing less than 5% of original fistula. 2. No evidence of thromboembolic or hemorrhagic complication on final angiograms and flat panel CT. PLAN: Patient transferred to ICU for overnight observation. Electronically Signed   By: Pedro Earls M.D.   On: 10/13/2021 16:50   IR Angiogram Follow Up Study  Result Date: 10/13/2021 INDICATION: 41 year old female with past medical history significant for chronic headache, IBS and thyroid disease presenting with pulsatile tinnitus with significant impact in her quality of life. MR angiogram performed September 10, 2021 was suggestive of a right transverse/sigmoid sinus dural AV fistula. 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 EXAM: ULTRASOUND-GUIDED VASCULAR ACCESS DIAGNOSTIC CEREBRAL ANGIOGRAM ENDOVASCULAR EMBOLIZATION OF DURAL ARTERIOVENOUS FISTULA FLAT PANEL HEAD CT COMPARISON:  Cerebral angiogram September 24, 2021. MEDICATIONS: Vancomycin  1 gm IV. The antibiotic was administered within 1 hour of the procedure. ANESTHESIA/SEDATION: The procedure was performed under general anesthesia. CONTRAST:  125 mL Omnipaque 300 milligram/mL. FLUOROSCOPY: Radiation Exposure Index (as provided by the fluoroscopic device): 1,209 mGy Kerma. COMPLICATIONS: None immediate. TECHNIQUE: Informed written consent was obtained from the patient after a thorough discussion of the procedural risks, benefits and alternatives. All questions were addressed. Maximal Sterile Barrier Technique was utilized including caps, mask, sterile gowns, sterile gloves, sterile drape, hand hygiene and skin antiseptic. A timeout was performed prior to the  initiation of the procedure. The right groin was prepped and draped in the usual sterile fashion. Using a micropuncture kit and the modified Seldinger technique, access was gained to the right common femoral artery and a 6 French sheath was placed. Real-time ultrasound guidance was utilized for vascular access including the acquisition of a permanent ultrasound image documenting patency of the accessed vessel. Next, using a micropuncture kit and the modified Seldinger technique, access was gained to the right common femoral vein and an 8 French sheath was placed. Real-time ultrasound guidance was utilized for vascular access including the acquisition of a permanent ultrasound image documenting patency of the accessed vessel. Through the arterial access and under fluoroscopy, a 4 [place] Berenstein 2 catheter was navigated over a 0.035" Terumo Glidewire into the aortic arch. The catheter was placed into the right common carotid artery and then advanced into the right internal carotid artery. Frontal and lateral angiograms of the head were obtained. The catheter was then retracted into the right external carotid artery and under fluoroscopic guidance, advanced into the left posterior auricular artery. Frontal and lateral angiograms of the head were obtained. The catheter was then placed into the internal maxillary artery. Frontal and lateral angiograms of the head were obtained. Finally, the catheter was placed into the left occipital artery. Frontal and lateral angiograms of the head were obtained. Through venous access and under fluoroscopy, an 8 [place] Neuron Max guide catheter was navigated over a 6 [place] Berenstein 2 catheter and a 0.035" Terumo Glidewire into the superior vena cava. The catheter was placed into the right internal jugular vein and then advanced into the right jugular bulb. The diagnostic catheter was removed. Frontal and lateral venograms of the skull base were obtained. FINDINGS: 1. Normal  caliber of the right common femoral artery, adequate for vascular access. 2. Normal caliber of the right common femoral artery, adequate for vascular access. 3. Right internal carotid artery angiograms showed brisk contrast opacification of the right MCA and ACA vascular trees a right sigmoid sinus fistula is seen supplied by the lateral tentorial artery. Physiological venous drainage through the left transverse/sigmoid sinus with fistula draining into the right sigmoid sinus. 4. Right posterior auricular artery angiograms showed dural AV fistula supplied by posterior auricular artery branches at the right transverse-sigmoid junction with retrograde flow into the contralateral transverse sinus. 5. Right internal maxillary artery angiograms showed dural AV fistula supplied by petrous and petrosquamous branches for of the right middle meningeal artery in draining into the transverse-sigmoid sinus junction noting retrograde flow into the left transverse sinus. 6. Right occipital artery angiograms showed dural AV fistula supplied by branches of the occipital artery draining into the right transverse-sigmoid junction as well as in the sigmoid sinus near the jugular bulb. Retrograde flow into the torcular, proximal superior sagittal sinus and left transverse sinus are noted. 7. There appear to be a separate channel of the right sigmoid sinus draining the fistula that  is partially separated from the main sinus lumen. 8. Right jugular bulb venogram confirms location of the catheter within the jugular bulb with rapid washout. Prominent posterior cervical ectatic veins are noted. PROCEDURE: Through the arterial catheter placed in the right occipital artery, magnified frontal and lateral views of the head were obtained centered on the posterior fossa. Images were utilized as roadmap. Through the neuron max, a scepter balloon catheter was navigated over an Aristotle 14 micro guidewire into the right sigmoid sinus. Except the  balloon was inflated and angiograms were obtained via right occipital artery contrast injection with frontal and lateral views the working projections. Angiogram confirmed the presence of 2 separate venous sinus channels with fistula draining into the smaller, most posteriorly located channel. Using biplane roadmap, an SL 10 microcatheter was navigated over a synchro support microguidewire into the right sigmoid posterior channel. Magnified angiograms with frontal and lateral views confirm the presence of the microcatheter within the separate channel. There are, coil embolization of this channel was performed via SL 10 microcatheter. Control angiograms were obtained after coil placement. A 3 mm x 6 cm 3D Axium coil, a 5 mm x 15 cm 3 Axium coil, a 2 mm x 8 cm helix Axium Prime coil, a 2.5 mm x 6 cm 3D Axium Prime coil, a 3 mm x 10 cm helix Axium Prime coil, a 3 mm x 10 cm you leaks Axium Prime coil, a 5 mm x 15 cm 3D Axium coil, a 6 mm x 20 cm 3D Axium coil, a 6 mm x 20 cm 3D Axium coil, a 7 mm x 30 cm 3D Axium Prime coil and a 5 mm x 20 cm 3D Axium Prime coil were implanted. An attempt to implant a 5 mm x 20 cm 3D Axium Prime coil resulted in coil stretching. This coil with the corresponding microcatheter were then retracted and removed. Then, using biplane roadmap guidance, a headway duo was navigated over a synchro 2 micro guidewire into the right sigmoid sinus posterior pouch where it coiling was greater the performed as described above. Additional coils were placed followed by control angiograms obtained via right occipital artery contrast injection with frontal and lateral views of the head. A 2 mm x 4 cm helix Axium Prime coil, a 3 mm x 8 cm 3D Axium Prime coil, a 2.5 mm x 4 cm 3D Axium Prime coil and a 2 mm x 3 cm helix Axium Prime coil were implanted. Right occipital artery angiograms with magnified frontal and lateral views of the head no longer shows early venous drainage into the right sigmoid sinus.  Faint delayed contrast opacification at the transverse-sigmoid sinus is noted which appear to represent minimal residual fistula, likely representing less than 5% of the original fistula. The Berenstein 2 catheter was placed into the right internal maxillary artery. Angiogram showed no opacification of the right middle meningeal artery, except for its origin, likely related to stasis. Then, the catheter was placed into the right internal carotid artery angiograms show brisk opacification of the right ACA and MCA vascular tree without evidence of thromboembolic complication. No early opacification of the right sigmoid sinus seen via lateral tentorial artery which appear decreased in caliber compared to pre embolization angiogram. Physiological anterograde flow through the right transverse and sigmoid sinus is seen. The catheter was subsequently retracted into the innominate artery and advanced into the right subclavian artery and then into the cervical right vertebral artery. Frontal and lateral angiograms of the head were obtained. Early  opacification of the right sigmoid sinus via posterior meningeal artery described on prior angiogram is no longer seen. Physiological anterograde flow through the right transverse and sigmoid sinus is seen. The arterial and venous catheters were subsequently withdrawn. Flat panel CT of the head was obtained and post processed in a separate workstation with concurrent attending physician supervision. Selected images were sent to PACS. No evidence of hemorrhagic complication noted. Right common femoral artery angiogram was obtained in right anterior oblique view. The puncture is at the level of the common femoral artery. The artery has normal caliber, adequate for closure device. A 5 [place] Exoseal was utilized for arterial access closure. The venous sheath was removed. Manual pressure was held over the arterial and venous access site for approximately 20 minutes. Adequate hemostasis  was achieved. IMPRESSION: 1. 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 Minimal residual fistula may be present at the transverse-sigmoid junction with delayed venous opacification, likely representing less than 5% of original fistula. 2. No evidence of thromboembolic or hemorrhagic complication on final angiograms and flat panel CT. PLAN: Patient transferred to ICU for overnight observation. Electronically Signed   By: Pedro Earls M.D.   On: 10/13/2021 16:50   IR Angiogram Follow Up Study  Result Date: 10/13/2021 INDICATION: 41 year old female with past medical history significant for chronic headache, IBS and thyroid disease presenting with pulsatile tinnitus with significant impact in her quality of life. MR angiogram performed September 10, 2021 was suggestive of a right transverse/sigmoid sinus dural AV fistula. 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 EXAM: ULTRASOUND-GUIDED VASCULAR ACCESS DIAGNOSTIC CEREBRAL ANGIOGRAM ENDOVASCULAR EMBOLIZATION OF DURAL ARTERIOVENOUS FISTULA FLAT PANEL HEAD CT COMPARISON:  Cerebral angiogram September 24, 2021. MEDICATIONS: Vancomycin 1 gm IV. The antibiotic was administered within 1 hour of the procedure. ANESTHESIA/SEDATION: The procedure was performed under general anesthesia. CONTRAST:  125 mL Omnipaque 300 milligram/mL. FLUOROSCOPY: Radiation Exposure Index (as provided by the fluoroscopic device): 1,209 mGy Kerma. COMPLICATIONS: None immediate. TECHNIQUE: Informed written consent was obtained from the patient after a thorough discussion of the procedural risks, benefits and alternatives. All questions were addressed. Maximal Sterile Barrier Technique was utilized including caps, mask, sterile gowns, sterile  gloves, sterile drape, hand hygiene and skin antiseptic. A timeout was performed prior to the initiation of the procedure. The right groin was prepped and draped in the usual sterile fashion. Using a micropuncture kit and the modified Seldinger technique, access was gained to the right common femoral artery and a 6 French sheath was placed. Real-time ultrasound guidance was utilized for vascular access including the acquisition of a permanent ultrasound image documenting patency of the accessed vessel. Next, using a micropuncture kit and the modified Seldinger technique, access was gained to the right common femoral vein and an 8 French sheath was placed. Real-time ultrasound guidance was utilized for vascular access including the acquisition of a permanent ultrasound image documenting patency of the accessed vessel. Through the arterial access and under fluoroscopy, a 4 [place] Berenstein 2 catheter was navigated over a 0.035" Terumo Glidewire into the aortic arch. The catheter was placed into the right common carotid artery and then advanced into the right internal carotid artery. Frontal and lateral angiograms of the head were obtained. The catheter was then retracted into the right external carotid artery and under fluoroscopic  guidance, advanced into the left posterior auricular artery. Frontal and lateral angiograms of the head were obtained. The catheter was then placed into the internal maxillary artery. Frontal and lateral angiograms of the head were obtained. Finally, the catheter was placed into the left occipital artery. Frontal and lateral angiograms of the head were obtained. Through venous access and under fluoroscopy, an 8 [place] Neuron Max guide catheter was navigated over a 6 [place] Berenstein 2 catheter and a 0.035" Terumo Glidewire into the superior vena cava. The catheter was placed into the right internal jugular vein and then advanced into the right jugular bulb. The diagnostic catheter was  removed. Frontal and lateral venograms of the skull base were obtained. FINDINGS: 1. Normal caliber of the right common femoral artery, adequate for vascular access. 2. Normal caliber of the right common femoral artery, adequate for vascular access. 3. Right internal carotid artery angiograms showed brisk contrast opacification of the right MCA and ACA vascular trees a right sigmoid sinus fistula is seen supplied by the lateral tentorial artery. Physiological venous drainage through the left transverse/sigmoid sinus with fistula draining into the right sigmoid sinus. 4. Right posterior auricular artery angiograms showed dural AV fistula supplied by posterior auricular artery branches at the right transverse-sigmoid junction with retrograde flow into the contralateral transverse sinus. 5. Right internal maxillary artery angiograms showed dural AV fistula supplied by petrous and petrosquamous branches for of the right middle meningeal artery in draining into the transverse-sigmoid sinus junction noting retrograde flow into the left transverse sinus. 6. Right occipital artery angiograms showed dural AV fistula supplied by branches of the occipital artery draining into the right transverse-sigmoid junction as well as in the sigmoid sinus near the jugular bulb. Retrograde flow into the torcular, proximal superior sagittal sinus and left transverse sinus are noted. 7. There appear to be a separate channel of the right sigmoid sinus draining the fistula that is partially separated from the main sinus lumen. 8. Right jugular bulb venogram confirms location of the catheter within the jugular bulb with rapid washout. Prominent posterior cervical ectatic veins are noted. PROCEDURE: Through the arterial catheter placed in the right occipital artery, magnified frontal and lateral views of the head were obtained centered on the posterior fossa. Images were utilized as roadmap. Through the neuron max, a scepter balloon catheter  was navigated over an Aristotle 14 micro guidewire into the right sigmoid sinus. Except the balloon was inflated and angiograms were obtained via right occipital artery contrast injection with frontal and lateral views the working projections. Angiogram confirmed the presence of 2 separate venous sinus channels with fistula draining into the smaller, most posteriorly located channel. Using biplane roadmap, an SL 10 microcatheter was navigated over a synchro support microguidewire into the right sigmoid posterior channel. Magnified angiograms with frontal and lateral views confirm the presence of the microcatheter within the separate channel. There are, coil embolization of this channel was performed via SL 10 microcatheter. Control angiograms were obtained after coil placement. A 3 mm x 6 cm 3D Axium coil, a 5 mm x 15 cm 3 Axium coil, a 2 mm x 8 cm helix Axium Prime coil, a 2.5 mm x 6 cm 3D Axium Prime coil, a 3 mm x 10 cm helix Axium Prime coil, a 3 mm x 10 cm you leaks Axium Prime coil, a 5 mm x 15 cm 3D Axium coil, a 6 mm x 20 cm 3D Axium coil, a 6 mm x 20 cm 3D Axium coil, a 7  mm x 30 cm 3D Axium Prime coil and a 5 mm x 20 cm 3D Axium Prime coil were implanted. An attempt to implant a 5 mm x 20 cm 3D Axium Prime coil resulted in coil stretching. This coil with the corresponding microcatheter were then retracted and removed. Then, using biplane roadmap guidance, a headway duo was navigated over a synchro 2 micro guidewire into the right sigmoid sinus posterior pouch where it coiling was greater the performed as described above. Additional coils were placed followed by control angiograms obtained via right occipital artery contrast injection with frontal and lateral views of the head. A 2 mm x 4 cm helix Axium Prime coil, a 3 mm x 8 cm 3D Axium Prime coil, a 2.5 mm x 4 cm 3D Axium Prime coil and a 2 mm x 3 cm helix Axium Prime coil were implanted. Right occipital artery angiograms with magnified frontal and  lateral views of the head no longer shows early venous drainage into the right sigmoid sinus. Faint delayed contrast opacification at the transverse-sigmoid sinus is noted which appear to represent minimal residual fistula, likely representing less than 5% of the original fistula. The Berenstein 2 catheter was placed into the right internal maxillary artery. Angiogram showed no opacification of the right middle meningeal artery, except for its origin, likely related to stasis. Then, the catheter was placed into the right internal carotid artery angiograms show brisk opacification of the right ACA and MCA vascular tree without evidence of thromboembolic complication. No early opacification of the right sigmoid sinus seen via lateral tentorial artery which appear decreased in caliber compared to pre embolization angiogram. Physiological anterograde flow through the right transverse and sigmoid sinus is seen. The catheter was subsequently retracted into the innominate artery and advanced into the right subclavian artery and then into the cervical right vertebral artery. Frontal and lateral angiograms of the head were obtained. Early opacification of the right sigmoid sinus via posterior meningeal artery described on prior angiogram is no longer seen. Physiological anterograde flow through the right transverse and sigmoid sinus is seen. The arterial and venous catheters were subsequently withdrawn. Flat panel CT of the head was obtained and post processed in a separate workstation with concurrent attending physician supervision. Selected images were sent to PACS. No evidence of hemorrhagic complication noted. Right common femoral artery angiogram was obtained in right anterior oblique view. The puncture is at the level of the common femoral artery. The artery has normal caliber, adequate for closure device. A 5 [place] Exoseal was utilized for arterial access closure. The venous sheath was removed. Manual pressure was  held over the arterial and venous access site for approximately 20 minutes. Adequate hemostasis was achieved. IMPRESSION: 1. 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 Minimal residual fistula may be present at the transverse-sigmoid junction with delayed venous opacification, likely representing less than 5% of original fistula. 2. No evidence of thromboembolic or hemorrhagic complication on final angiograms and flat panel CT. PLAN: Patient transferred to ICU for overnight observation. Electronically Signed   By: Pedro Earls M.D.   On: 10/13/2021 16:50   IR Angiogram Follow Up Study  Result Date: 10/13/2021 INDICATION: 41 year old female with past medical history significant for chronic headache, IBS and thyroid disease presenting with pulsatile tinnitus with significant impact in her quality of life. MR angiogram performed September 10, 2021 was suggestive of  a right transverse/sigmoid sinus dural AV fistula. 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 EXAM: ULTRASOUND-GUIDED VASCULAR ACCESS DIAGNOSTIC CEREBRAL ANGIOGRAM ENDOVASCULAR EMBOLIZATION OF DURAL ARTERIOVENOUS FISTULA FLAT PANEL HEAD CT COMPARISON:  Cerebral angiogram September 24, 2021. MEDICATIONS: Vancomycin 1 gm IV. The antibiotic was administered within 1 hour of the procedure. ANESTHESIA/SEDATION: The procedure was performed under general anesthesia. CONTRAST:  125 mL Omnipaque 300 milligram/mL. FLUOROSCOPY: Radiation Exposure Index (as provided by the fluoroscopic device): 1,209 mGy Kerma. COMPLICATIONS: None immediate. TECHNIQUE: Informed written consent was obtained from the patient after a thorough discussion of the procedural risks, benefits and alternatives. All questions were  addressed. Maximal Sterile Barrier Technique was utilized including caps, mask, sterile gowns, sterile gloves, sterile drape, hand hygiene and skin antiseptic. A timeout was performed prior to the initiation of the procedure. The right groin was prepped and draped in the usual sterile fashion. Using a micropuncture kit and the modified Seldinger technique, access was gained to the right common femoral artery and a 6 French sheath was placed. Real-time ultrasound guidance was utilized for vascular access including the acquisition of a permanent ultrasound image documenting patency of the accessed vessel. Next, using a micropuncture kit and the modified Seldinger technique, access was gained to the right common femoral vein and an 8 French sheath was placed. Real-time ultrasound guidance was utilized for vascular access including the acquisition of a permanent ultrasound image documenting patency of the accessed vessel. Through the arterial access and under fluoroscopy, a 4 [place] Berenstein 2 catheter was navigated over a 0.035" Terumo Glidewire into the aortic arch. The catheter was placed into the right common carotid artery and then advanced into the right internal carotid artery. Frontal and lateral angiograms of the head were obtained. The catheter was then retracted into the right external carotid artery and under fluoroscopic guidance, advanced into the left posterior auricular artery. Frontal and lateral angiograms of the head were obtained. The catheter was then placed into the internal maxillary artery. Frontal and lateral angiograms of the head were obtained. Finally, the catheter was placed into the left occipital artery. Frontal and lateral angiograms of the head were obtained. Through venous access and under fluoroscopy, an 8 [place] Neuron Max guide catheter was navigated over a 6 [place] Berenstein 2 catheter and a 0.035" Terumo Glidewire into the superior vena cava. The catheter was placed into the  right internal jugular vein and then advanced into the right jugular bulb. The diagnostic catheter was removed. Frontal and lateral venograms of the skull base were obtained. FINDINGS: 1. Normal caliber of the right common femoral artery, adequate for vascular access. 2. Normal caliber of the right common femoral artery, adequate for vascular access. 3. Right internal carotid artery angiograms showed brisk contrast opacification of the right MCA and ACA vascular trees a right sigmoid sinus fistula is seen supplied by the lateral tentorial artery. Physiological venous drainage through the left transverse/sigmoid sinus with fistula draining into the right sigmoid sinus. 4. Right posterior auricular artery angiograms showed dural AV fistula supplied by posterior auricular artery branches at the right transverse-sigmoid junction with retrograde flow into the contralateral transverse sinus. 5. Right internal maxillary artery angiograms showed dural AV fistula supplied by petrous and petrosquamous branches for of the right middle meningeal artery in draining into the transverse-sigmoid sinus junction noting retrograde flow into the left transverse sinus. 6. Right occipital artery angiograms showed dural AV fistula supplied by  branches of the occipital artery draining into the right transverse-sigmoid junction as well as in the sigmoid sinus near the jugular bulb. Retrograde flow into the torcular, proximal superior sagittal sinus and left transverse sinus are noted. 7. There appear to be a separate channel of the right sigmoid sinus draining the fistula that is partially separated from the main sinus lumen. 8. Right jugular bulb venogram confirms location of the catheter within the jugular bulb with rapid washout. Prominent posterior cervical ectatic veins are noted. PROCEDURE: Through the arterial catheter placed in the right occipital artery, magnified frontal and lateral views of the head were obtained centered on the  posterior fossa. Images were utilized as roadmap. Through the neuron max, a scepter balloon catheter was navigated over an Aristotle 14 micro guidewire into the right sigmoid sinus. Except the balloon was inflated and angiograms were obtained via right occipital artery contrast injection with frontal and lateral views the working projections. Angiogram confirmed the presence of 2 separate venous sinus channels with fistula draining into the smaller, most posteriorly located channel. Using biplane roadmap, an SL 10 microcatheter was navigated over a synchro support microguidewire into the right sigmoid posterior channel. Magnified angiograms with frontal and lateral views confirm the presence of the microcatheter within the separate channel. There are, coil embolization of this channel was performed via SL 10 microcatheter. Control angiograms were obtained after coil placement. A 3 mm x 6 cm 3D Axium coil, a 5 mm x 15 cm 3 Axium coil, a 2 mm x 8 cm helix Axium Prime coil, a 2.5 mm x 6 cm 3D Axium Prime coil, a 3 mm x 10 cm helix Axium Prime coil, a 3 mm x 10 cm you leaks Axium Prime coil, a 5 mm x 15 cm 3D Axium coil, a 6 mm x 20 cm 3D Axium coil, a 6 mm x 20 cm 3D Axium coil, a 7 mm x 30 cm 3D Axium Prime coil and a 5 mm x 20 cm 3D Axium Prime coil were implanted. An attempt to implant a 5 mm x 20 cm 3D Axium Prime coil resulted in coil stretching. This coil with the corresponding microcatheter were then retracted and removed. Then, using biplane roadmap guidance, a headway duo was navigated over a synchro 2 micro guidewire into the right sigmoid sinus posterior pouch where it coiling was greater the performed as described above. Additional coils were placed followed by control angiograms obtained via right occipital artery contrast injection with frontal and lateral views of the head. A 2 mm x 4 cm helix Axium Prime coil, a 3 mm x 8 cm 3D Axium Prime coil, a 2.5 mm x 4 cm 3D Axium Prime coil and a 2 mm x 3 cm helix  Axium Prime coil were implanted. Right occipital artery angiograms with magnified frontal and lateral views of the head no longer shows early venous drainage into the right sigmoid sinus. Faint delayed contrast opacification at the transverse-sigmoid sinus is noted which appear to represent minimal residual fistula, likely representing less than 5% of the original fistula. The Berenstein 2 catheter was placed into the right internal maxillary artery. Angiogram showed no opacification of the right middle meningeal artery, except for its origin, likely related to stasis. Then, the catheter was placed into the right internal carotid artery angiograms show brisk opacification of the right ACA and MCA vascular tree without evidence of thromboembolic complication. No early opacification of the right sigmoid sinus seen via lateral tentorial artery which appear  decreased in caliber compared to pre embolization angiogram. Physiological anterograde flow through the right transverse and sigmoid sinus is seen. The catheter was subsequently retracted into the innominate artery and advanced into the right subclavian artery and then into the cervical right vertebral artery. Frontal and lateral angiograms of the head were obtained. Early opacification of the right sigmoid sinus via posterior meningeal artery described on prior angiogram is no longer seen. Physiological anterograde flow through the right transverse and sigmoid sinus is seen. The arterial and venous catheters were subsequently withdrawn. Flat panel CT of the head was obtained and post processed in a separate workstation with concurrent attending physician supervision. Selected images were sent to PACS. No evidence of hemorrhagic complication noted. Right common femoral artery angiogram was obtained in right anterior oblique view. The puncture is at the level of the common femoral artery. The artery has normal caliber, adequate for closure device. A 5 [place] Exoseal  was utilized for arterial access closure. The venous sheath was removed. Manual pressure was held over the arterial and venous access site for approximately 20 minutes. Adequate hemostasis was achieved. IMPRESSION: 1. 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 Minimal residual fistula may be present at the transverse-sigmoid junction with delayed venous opacification, likely representing less than 5% of original fistula. 2. No evidence of thromboembolic or hemorrhagic complication on final angiograms and flat panel CT. PLAN: Patient transferred to ICU for overnight observation. Electronically Signed   By: Pedro Earls M.D.   On: 10/13/2021 16:50   IR Angiogram Follow Up Study  Result Date: 10/13/2021 INDICATION: 41 year old female with past medical history significant for chronic headache, IBS and thyroid disease presenting with pulsatile tinnitus with significant impact in her quality of life. MR angiogram performed September 10, 2021 was suggestive of a right transverse/sigmoid sinus dural AV fistula. 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 EXAM: ULTRASOUND-GUIDED VASCULAR ACCESS DIAGNOSTIC CEREBRAL ANGIOGRAM ENDOVASCULAR EMBOLIZATION OF DURAL ARTERIOVENOUS FISTULA FLAT PANEL HEAD CT COMPARISON:  Cerebral angiogram September 24, 2021. MEDICATIONS: Vancomycin 1 gm IV. The antibiotic was administered within 1 hour of the procedure. ANESTHESIA/SEDATION: The procedure was performed under general anesthesia. CONTRAST:  125 mL Omnipaque 300 milligram/mL. FLUOROSCOPY: Radiation Exposure Index (as provided by the fluoroscopic device): 1,209 mGy Kerma. COMPLICATIONS: None immediate. TECHNIQUE: Informed written consent was obtained from the patient after a  thorough discussion of the procedural risks, benefits and alternatives. All questions were addressed. Maximal Sterile Barrier Technique was utilized including caps, mask, sterile gowns, sterile gloves, sterile drape, hand hygiene and skin antiseptic. A timeout was performed prior to the initiation of the procedure. The right groin was prepped and draped in the usual sterile fashion. Using a micropuncture kit and the modified Seldinger technique, access was gained to the right common femoral artery and a 6 French sheath was placed. Real-time ultrasound guidance was utilized for vascular access including the acquisition of a permanent ultrasound image documenting patency of the accessed vessel. Next, using a micropuncture kit and the modified Seldinger technique, access was gained to the right common femoral vein and an 8 French sheath was placed. Real-time ultrasound guidance was utilized for vascular access including the acquisition of a permanent ultrasound image documenting patency of the accessed vessel. Through the arterial access and under fluoroscopy, a 4 [place] Berenstein 2  catheter was navigated over a 0.035" Terumo Glidewire into the aortic arch. The catheter was placed into the right common carotid artery and then advanced into the right internal carotid artery. Frontal and lateral angiograms of the head were obtained. The catheter was then retracted into the right external carotid artery and under fluoroscopic guidance, advanced into the left posterior auricular artery. Frontal and lateral angiograms of the head were obtained. The catheter was then placed into the internal maxillary artery. Frontal and lateral angiograms of the head were obtained. Finally, the catheter was placed into the left occipital artery. Frontal and lateral angiograms of the head were obtained. Through venous access and under fluoroscopy, an 8 [place] Neuron Max guide catheter was navigated over a 6 [place] Berenstein 2 catheter and  a 0.035" Terumo Glidewire into the superior vena cava. The catheter was placed into the right internal jugular vein and then advanced into the right jugular bulb. The diagnostic catheter was removed. Frontal and lateral venograms of the skull base were obtained. FINDINGS: 1. Normal caliber of the right common femoral artery, adequate for vascular access. 2. Normal caliber of the right common femoral artery, adequate for vascular access. 3. Right internal carotid artery angiograms showed brisk contrast opacification of the right MCA and ACA vascular trees a right sigmoid sinus fistula is seen supplied by the lateral tentorial artery. Physiological venous drainage through the left transverse/sigmoid sinus with fistula draining into the right sigmoid sinus. 4. Right posterior auricular artery angiograms showed dural AV fistula supplied by posterior auricular artery branches at the right transverse-sigmoid junction with retrograde flow into the contralateral transverse sinus. 5. Right internal maxillary artery angiograms showed dural AV fistula supplied by petrous and petrosquamous branches for of the right middle meningeal artery in draining into the transverse-sigmoid sinus junction noting retrograde flow into the left transverse sinus. 6. Right occipital artery angiograms showed dural AV fistula supplied by branches of the occipital artery draining into the right transverse-sigmoid junction as well as in the sigmoid sinus near the jugular bulb. Retrograde flow into the torcular, proximal superior sagittal sinus and left transverse sinus are noted. 7. There appear to be a separate channel of the right sigmoid sinus draining the fistula that is partially separated from the main sinus lumen. 8. Right jugular bulb venogram confirms location of the catheter within the jugular bulb with rapid washout. Prominent posterior cervical ectatic veins are noted. PROCEDURE: Through the arterial catheter placed in the right  occipital artery, magnified frontal and lateral views of the head were obtained centered on the posterior fossa. Images were utilized as roadmap. Through the neuron max, a scepter balloon catheter was navigated over an Aristotle 14 micro guidewire into the right sigmoid sinus. Except the balloon was inflated and angiograms were obtained via right occipital artery contrast injection with frontal and lateral views the working projections. Angiogram confirmed the presence of 2 separate venous sinus channels with fistula draining into the smaller, most posteriorly located channel. Using biplane roadmap, an SL 10 microcatheter was navigated over a synchro support microguidewire into the right sigmoid posterior channel. Magnified angiograms with frontal and lateral views confirm the presence of the microcatheter within the separate channel. There are, coil embolization of this channel was performed via SL 10 microcatheter. Control angiograms were obtained after coil placement. A 3 mm x 6 cm 3D Axium coil, a 5 mm x 15 cm 3 Axium coil, a 2 mm x 8 cm helix Axium Prime coil, a 2.5 mm x 6 cm  3D Axium Prime coil, a 3 mm x 10 cm helix Axium Prime coil, a 3 mm x 10 cm you leaks Axium Prime coil, a 5 mm x 15 cm 3D Axium coil, a 6 mm x 20 cm 3D Axium coil, a 6 mm x 20 cm 3D Axium coil, a 7 mm x 30 cm 3D Axium Prime coil and a 5 mm x 20 cm 3D Axium Prime coil were implanted. An attempt to implant a 5 mm x 20 cm 3D Axium Prime coil resulted in coil stretching. This coil with the corresponding microcatheter were then retracted and removed. Then, using biplane roadmap guidance, a headway duo was navigated over a synchro 2 micro guidewire into the right sigmoid sinus posterior pouch where it coiling was greater the performed as described above. Additional coils were placed followed by control angiograms obtained via right occipital artery contrast injection with frontal and lateral views of the head. A 2 mm x 4 cm helix Axium Prime  coil, a 3 mm x 8 cm 3D Axium Prime coil, a 2.5 mm x 4 cm 3D Axium Prime coil and a 2 mm x 3 cm helix Axium Prime coil were implanted. Right occipital artery angiograms with magnified frontal and lateral views of the head no longer shows early venous drainage into the right sigmoid sinus. Faint delayed contrast opacification at the transverse-sigmoid sinus is noted which appear to represent minimal residual fistula, likely representing less than 5% of the original fistula. The Berenstein 2 catheter was placed into the right internal maxillary artery. Angiogram showed no opacification of the right middle meningeal artery, except for its origin, likely related to stasis. Then, the catheter was placed into the right internal carotid artery angiograms show brisk opacification of the right ACA and MCA vascular tree without evidence of thromboembolic complication. No early opacification of the right sigmoid sinus seen via lateral tentorial artery which appear decreased in caliber compared to pre embolization angiogram. Physiological anterograde flow through the right transverse and sigmoid sinus is seen. The catheter was subsequently retracted into the innominate artery and advanced into the right subclavian artery and then into the cervical right vertebral artery. Frontal and lateral angiograms of the head were obtained. Early opacification of the right sigmoid sinus via posterior meningeal artery described on prior angiogram is no longer seen. Physiological anterograde flow through the right transverse and sigmoid sinus is seen. The arterial and venous catheters were subsequently withdrawn. Flat panel CT of the head was obtained and post processed in a separate workstation with concurrent attending physician supervision. Selected images were sent to PACS. No evidence of hemorrhagic complication noted. Right common femoral artery angiogram was obtained in right anterior oblique view. The puncture is at the level of the  common femoral artery. The artery has normal caliber, adequate for closure device. A 5 [place] Exoseal was utilized for arterial access closure. The venous sheath was removed. Manual pressure was held over the arterial and venous access site for approximately 20 minutes. Adequate hemostasis was achieved. IMPRESSION: 1. 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 Minimal residual fistula may be present at the transverse-sigmoid junction with delayed venous opacification, likely representing less than 5% of original fistula. 2. No evidence of thromboembolic or hemorrhagic complication on final angiograms and flat panel CT. PLAN: Patient transferred to ICU for overnight observation. Electronically Signed   By: Jeanella Craze  Debbrah Alar M.D.   On: 10/13/2021 16:50   IR Angiogram Follow Up Study  Result Date: 10/13/2021 INDICATION: 41 year old female with past medical history significant for chronic headache, IBS and thyroid disease presenting with pulsatile tinnitus with significant impact in her quality of life. MR angiogram performed September 10, 2021 was suggestive of a right transverse/sigmoid sinus dural AV fistula. 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 EXAM: ULTRASOUND-GUIDED VASCULAR ACCESS DIAGNOSTIC CEREBRAL ANGIOGRAM ENDOVASCULAR EMBOLIZATION OF DURAL ARTERIOVENOUS FISTULA FLAT PANEL HEAD CT COMPARISON:  Cerebral angiogram September 24, 2021. MEDICATIONS: Vancomycin 1 gm IV. The antibiotic was administered within 1 hour of the procedure. ANESTHESIA/SEDATION: The procedure was performed under general anesthesia. CONTRAST:  125 mL Omnipaque 300 milligram/mL. FLUOROSCOPY: Radiation Exposure Index (as provided by the fluoroscopic device): 1,209 mGy Kerma.  COMPLICATIONS: None immediate. TECHNIQUE: Informed written consent was obtained from the patient after a thorough discussion of the procedural risks, benefits and alternatives. All questions were addressed. Maximal Sterile Barrier Technique was utilized including caps, mask, sterile gowns, sterile gloves, sterile drape, hand hygiene and skin antiseptic. A timeout was performed prior to the initiation of the procedure. The right groin was prepped and draped in the usual sterile fashion. Using a micropuncture kit and the modified Seldinger technique, access was gained to the right common femoral artery and a 6 French sheath was placed. Real-time ultrasound guidance was utilized for vascular access including the acquisition of a permanent ultrasound image documenting patency of the accessed vessel. Next, using a micropuncture kit and the modified Seldinger technique, access was gained to the right common femoral vein and an 8 French sheath was placed. Real-time ultrasound guidance was utilized for vascular access including the acquisition of a permanent ultrasound image documenting patency of the accessed vessel. Through the arterial access and under fluoroscopy, a 4 [place] Berenstein 2 catheter was navigated over a 0.035" Terumo Glidewire into the aortic arch. The catheter was placed into the right common carotid artery and then advanced into the right internal carotid artery. Frontal and lateral angiograms of the head were obtained. The catheter was then retracted into the right external carotid artery and under fluoroscopic guidance, advanced into the left posterior auricular artery. Frontal and lateral angiograms of the head were obtained. The catheter was then placed into the internal maxillary artery. Frontal and lateral angiograms of the head were obtained. Finally, the catheter was placed into the left occipital artery. Frontal and lateral angiograms of the head were obtained. Through venous access and under  fluoroscopy, an 8 [place] Neuron Max guide catheter was navigated over a 6 [place] Berenstein 2 catheter and a 0.035" Terumo Glidewire into the superior vena cava. The catheter was placed into the right internal jugular vein and then advanced into the right jugular bulb. The diagnostic catheter was removed. Frontal and lateral venograms of the skull base were obtained. FINDINGS: 1. Normal caliber of the right common femoral artery, adequate for vascular access. 2. Normal caliber of the right common femoral artery, adequate for vascular access. 3. Right internal carotid artery angiograms showed brisk contrast opacification of the right MCA and ACA vascular trees a right sigmoid sinus fistula is seen supplied by the lateral tentorial artery. Physiological venous drainage through the left transverse/sigmoid sinus with fistula draining into the right sigmoid sinus. 4. Right posterior auricular artery angiograms showed dural AV fistula supplied by posterior auricular artery branches at the right transverse-sigmoid junction with retrograde flow  into the contralateral transverse sinus. 5. Right internal maxillary artery angiograms showed dural AV fistula supplied by petrous and petrosquamous branches for of the right middle meningeal artery in draining into the transverse-sigmoid sinus junction noting retrograde flow into the left transverse sinus. 6. Right occipital artery angiograms showed dural AV fistula supplied by branches of the occipital artery draining into the right transverse-sigmoid junction as well as in the sigmoid sinus near the jugular bulb. Retrograde flow into the torcular, proximal superior sagittal sinus and left transverse sinus are noted. 7. There appear to be a separate channel of the right sigmoid sinus draining the fistula that is partially separated from the main sinus lumen. 8. Right jugular bulb venogram confirms location of the catheter within the jugular bulb with rapid washout. Prominent  posterior cervical ectatic veins are noted. PROCEDURE: Through the arterial catheter placed in the right occipital artery, magnified frontal and lateral views of the head were obtained centered on the posterior fossa. Images were utilized as roadmap. Through the neuron max, a scepter balloon catheter was navigated over an Aristotle 14 micro guidewire into the right sigmoid sinus. Except the balloon was inflated and angiograms were obtained via right occipital artery contrast injection with frontal and lateral views the working projections. Angiogram confirmed the presence of 2 separate venous sinus channels with fistula draining into the smaller, most posteriorly located channel. Using biplane roadmap, an SL 10 microcatheter was navigated over a synchro support microguidewire into the right sigmoid posterior channel. Magnified angiograms with frontal and lateral views confirm the presence of the microcatheter within the separate channel. There are, coil embolization of this channel was performed via SL 10 microcatheter. Control angiograms were obtained after coil placement. A 3 mm x 6 cm 3D Axium coil, a 5 mm x 15 cm 3 Axium coil, a 2 mm x 8 cm helix Axium Prime coil, a 2.5 mm x 6 cm 3D Axium Prime coil, a 3 mm x 10 cm helix Axium Prime coil, a 3 mm x 10 cm you leaks Axium Prime coil, a 5 mm x 15 cm 3D Axium coil, a 6 mm x 20 cm 3D Axium coil, a 6 mm x 20 cm 3D Axium coil, a 7 mm x 30 cm 3D Axium Prime coil and a 5 mm x 20 cm 3D Axium Prime coil were implanted. An attempt to implant a 5 mm x 20 cm 3D Axium Prime coil resulted in coil stretching. This coil with the corresponding microcatheter were then retracted and removed. Then, using biplane roadmap guidance, a headway duo was navigated over a synchro 2 micro guidewire into the right sigmoid sinus posterior pouch where it coiling was greater the performed as described above. Additional coils were placed followed by control angiograms obtained via right occipital  artery contrast injection with frontal and lateral views of the head. A 2 mm x 4 cm helix Axium Prime coil, a 3 mm x 8 cm 3D Axium Prime coil, a 2.5 mm x 4 cm 3D Axium Prime coil and a 2 mm x 3 cm helix Axium Prime coil were implanted. Right occipital artery angiograms with magnified frontal and lateral views of the head no longer shows early venous drainage into the right sigmoid sinus. Faint delayed contrast opacification at the transverse-sigmoid sinus is noted which appear to represent minimal residual fistula, likely representing less than 5% of the original fistula. The Berenstein 2 catheter was placed into the right internal maxillary artery. Angiogram showed no opacification of the right  middle meningeal artery, except for its origin, likely related to stasis. Then, the catheter was placed into the right internal carotid artery angiograms show brisk opacification of the right ACA and MCA vascular tree without evidence of thromboembolic complication. No early opacification of the right sigmoid sinus seen via lateral tentorial artery which appear decreased in caliber compared to pre embolization angiogram. Physiological anterograde flow through the right transverse and sigmoid sinus is seen. The catheter was subsequently retracted into the innominate artery and advanced into the right subclavian artery and then into the cervical right vertebral artery. Frontal and lateral angiograms of the head were obtained. Early opacification of the right sigmoid sinus via posterior meningeal artery described on prior angiogram is no longer seen. Physiological anterograde flow through the right transverse and sigmoid sinus is seen. The arterial and venous catheters were subsequently withdrawn. Flat panel CT of the head was obtained and post processed in a separate workstation with concurrent attending physician supervision. Selected images were sent to PACS. No evidence of hemorrhagic complication noted. Right common  femoral artery angiogram was obtained in right anterior oblique view. The puncture is at the level of the common femoral artery. The artery has normal caliber, adequate for closure device. A 5 [place] Exoseal was utilized for arterial access closure. The venous sheath was removed. Manual pressure was held over the arterial and venous access site for approximately 20 minutes. Adequate hemostasis was achieved. IMPRESSION: 1. 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 Minimal residual fistula may be present at the transverse-sigmoid junction with delayed venous opacification, likely representing less than 5% of original fistula. 2. No evidence of thromboembolic or hemorrhagic complication on final angiograms and flat panel CT. PLAN: Patient transferred to ICU for overnight observation. Electronically Signed   By: Pedro Earls M.D.   On: 10/13/2021 16:50   IR Angiogram Follow Up Study  Result Date: 10/13/2021 INDICATION: 41 year old female with past medical history significant for chronic headache, IBS and thyroid disease presenting with pulsatile tinnitus with significant impact in her quality of life. MR angiogram performed September 10, 2021 was suggestive of a right transverse/sigmoid sinus dural AV fistula. 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 EXAM: ULTRASOUND-GUIDED VASCULAR ACCESS DIAGNOSTIC CEREBRAL ANGIOGRAM ENDOVASCULAR EMBOLIZATION OF DURAL ARTERIOVENOUS FISTULA FLAT PANEL HEAD CT COMPARISON:  Cerebral angiogram September 24, 2021. MEDICATIONS: Vancomycin 1 gm IV. The antibiotic was administered within 1 hour of the procedure. ANESTHESIA/SEDATION: The procedure was performed under general anesthesia. CONTRAST:  125 mL Omnipaque 300 milligram/mL.  FLUOROSCOPY: Radiation Exposure Index (as provided by the fluoroscopic device): 1,209 mGy Kerma. COMPLICATIONS: None immediate. TECHNIQUE: Informed written consent was obtained from the patient after a thorough discussion of the procedural risks, benefits and alternatives. All questions were addressed. Maximal Sterile Barrier Technique was utilized including caps, mask, sterile gowns, sterile gloves, sterile drape, hand hygiene and skin antiseptic. A timeout was performed prior to the initiation of the procedure. The right groin was prepped and draped in the usual sterile fashion. Using a micropuncture kit and the modified Seldinger technique, access was gained to the right common femoral artery and a 6 French sheath was placed. Real-time ultrasound guidance was utilized for vascular access including the acquisition of a permanent ultrasound image documenting patency of the accessed vessel. Next, using a micropuncture kit and  the modified Seldinger technique, access was gained to the right common femoral vein and an 8 French sheath was placed. Real-time ultrasound guidance was utilized for vascular access including the acquisition of a permanent ultrasound image documenting patency of the accessed vessel. Through the arterial access and under fluoroscopy, a 4 [place] Berenstein 2 catheter was navigated over a 0.035" Terumo Glidewire into the aortic arch. The catheter was placed into the right common carotid artery and then advanced into the right internal carotid artery. Frontal and lateral angiograms of the head were obtained. The catheter was then retracted into the right external carotid artery and under fluoroscopic guidance, advanced into the left posterior auricular artery. Frontal and lateral angiograms of the head were obtained. The catheter was then placed into the internal maxillary artery. Frontal and lateral angiograms of the head were obtained. Finally, the catheter was placed into the left occipital  artery. Frontal and lateral angiograms of the head were obtained. Through venous access and under fluoroscopy, an 8 [place] Neuron Max guide catheter was navigated over a 6 [place] Berenstein 2 catheter and a 0.035" Terumo Glidewire into the superior vena cava. The catheter was placed into the right internal jugular vein and then advanced into the right jugular bulb. The diagnostic catheter was removed. Frontal and lateral venograms of the skull base were obtained. FINDINGS: 1. Normal caliber of the right common femoral artery, adequate for vascular access. 2. Normal caliber of the right common femoral artery, adequate for vascular access. 3. Right internal carotid artery angiograms showed brisk contrast opacification of the right MCA and ACA vascular trees a right sigmoid sinus fistula is seen supplied by the lateral tentorial artery. Physiological venous drainage through the left transverse/sigmoid sinus with fistula draining into the right sigmoid sinus. 4. Right posterior auricular artery angiograms showed dural AV fistula supplied by posterior auricular artery branches at the right transverse-sigmoid junction with retrograde flow into the contralateral transverse sinus. 5. Right internal maxillary artery angiograms showed dural AV fistula supplied by petrous and petrosquamous branches for of the right middle meningeal artery in draining into the transverse-sigmoid sinus junction noting retrograde flow into the left transverse sinus. 6. Right occipital artery angiograms showed dural AV fistula supplied by branches of the occipital artery draining into the right transverse-sigmoid junction as well as in the sigmoid sinus near the jugular bulb. Retrograde flow into the torcular, proximal superior sagittal sinus and left transverse sinus are noted. 7. There appear to be a separate channel of the right sigmoid sinus draining the fistula that is partially separated from the main sinus lumen. 8. Right jugular bulb  venogram confirms location of the catheter within the jugular bulb with rapid washout. Prominent posterior cervical ectatic veins are noted. PROCEDURE: Through the arterial catheter placed in the right occipital artery, magnified frontal and lateral views of the head were obtained centered on the posterior fossa. Images were utilized as roadmap. Through the neuron max, a scepter balloon catheter was navigated over an Aristotle 14 micro guidewire into the right sigmoid sinus. Except the balloon was inflated and angiograms were obtained via right occipital artery contrast injection with frontal and lateral views the working projections. Angiogram confirmed the presence of 2 separate venous sinus channels with fistula draining into the smaller, most posteriorly located channel. Using biplane roadmap, an SL 10 microcatheter was navigated over a synchro support microguidewire into the right sigmoid posterior channel. Magnified angiograms with frontal and lateral views confirm the presence of the microcatheter within the separate channel.  There are, coil embolization of this channel was performed via SL 10 microcatheter. Control angiograms were obtained after coil placement. A 3 mm x 6 cm 3D Axium coil, a 5 mm x 15 cm 3 Axium coil, a 2 mm x 8 cm helix Axium Prime coil, a 2.5 mm x 6 cm 3D Axium Prime coil, a 3 mm x 10 cm helix Axium Prime coil, a 3 mm x 10 cm you leaks Axium Prime coil, a 5 mm x 15 cm 3D Axium coil, a 6 mm x 20 cm 3D Axium coil, a 6 mm x 20 cm 3D Axium coil, a 7 mm x 30 cm 3D Axium Prime coil and a 5 mm x 20 cm 3D Axium Prime coil were implanted. An attempt to implant a 5 mm x 20 cm 3D Axium Prime coil resulted in coil stretching. This coil with the corresponding microcatheter were then retracted and removed. Then, using biplane roadmap guidance, a headway duo was navigated over a synchro 2 micro guidewire into the right sigmoid sinus posterior pouch where it coiling was greater the performed as described  above. Additional coils were placed followed by control angiograms obtained via right occipital artery contrast injection with frontal and lateral views of the head. A 2 mm x 4 cm helix Axium Prime coil, a 3 mm x 8 cm 3D Axium Prime coil, a 2.5 mm x 4 cm 3D Axium Prime coil and a 2 mm x 3 cm helix Axium Prime coil were implanted. Right occipital artery angiograms with magnified frontal and lateral views of the head no longer shows early venous drainage into the right sigmoid sinus. Faint delayed contrast opacification at the transverse-sigmoid sinus is noted which appear to represent minimal residual fistula, likely representing less than 5% of the original fistula. The Berenstein 2 catheter was placed into the right internal maxillary artery. Angiogram showed no opacification of the right middle meningeal artery, except for its origin, likely related to stasis. Then, the catheter was placed into the right internal carotid artery angiograms show brisk opacification of the right ACA and MCA vascular tree without evidence of thromboembolic complication. No early opacification of the right sigmoid sinus seen via lateral tentorial artery which appear decreased in caliber compared to pre embolization angiogram. Physiological anterograde flow through the right transverse and sigmoid sinus is seen. The catheter was subsequently retracted into the innominate artery and advanced into the right subclavian artery and then into the cervical right vertebral artery. Frontal and lateral angiograms of the head were obtained. Early opacification of the right sigmoid sinus via posterior meningeal artery described on prior angiogram is no longer seen. Physiological anterograde flow through the right transverse and sigmoid sinus is seen. The arterial and venous catheters were subsequently withdrawn. Flat panel CT of the head was obtained and post processed in a separate workstation with concurrent attending physician supervision.  Selected images were sent to PACS. No evidence of hemorrhagic complication noted. Right common femoral artery angiogram was obtained in right anterior oblique view. The puncture is at the level of the common femoral artery. The artery has normal caliber, adequate for closure device. A 5 [place] Exoseal was utilized for arterial access closure. The venous sheath was removed. Manual pressure was held over the arterial and venous access site for approximately 20 minutes. Adequate hemostasis was achieved. IMPRESSION: 1. 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  Minimal residual fistula may be present at the transverse-sigmoid junction with delayed venous opacification, likely representing less than 5% of original fistula. 2. No evidence of thromboembolic or hemorrhagic complication on final angiograms and flat panel CT. PLAN: Patient transferred to ICU for overnight observation. Electronically Signed   By: Pedro Earls M.D.   On: 10/13/2021 16:50   IR Angiogram Follow Up Study  Result Date: 10/13/2021 INDICATION: 41 year old female with past medical history significant for chronic headache, IBS and thyroid disease presenting with pulsatile tinnitus with significant impact in her quality of life. MR angiogram performed September 10, 2021 was suggestive of a right transverse/sigmoid sinus dural AV fistula. 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 EXAM: ULTRASOUND-GUIDED VASCULAR ACCESS DIAGNOSTIC CEREBRAL ANGIOGRAM ENDOVASCULAR EMBOLIZATION OF DURAL ARTERIOVENOUS FISTULA FLAT PANEL HEAD CT COMPARISON:  Cerebral angiogram September 24, 2021. MEDICATIONS: Vancomycin 1 gm IV. The antibiotic was administered within 1 hour of the procedure. ANESTHESIA/SEDATION: The  procedure was performed under general anesthesia. CONTRAST:  125 mL Omnipaque 300 milligram/mL. FLUOROSCOPY: Radiation Exposure Index (as provided by the fluoroscopic device): 1,209 mGy Kerma. COMPLICATIONS: None immediate. TECHNIQUE: Informed written consent was obtained from the patient after a thorough discussion of the procedural risks, benefits and alternatives. All questions were addressed. Maximal Sterile Barrier Technique was utilized including caps, mask, sterile gowns, sterile gloves, sterile drape, hand hygiene and skin antiseptic. A timeout was performed prior to the initiation of the procedure. The right groin was prepped and draped in the usual sterile fashion. Using a micropuncture kit and the modified Seldinger technique, access was gained to the right common femoral artery and a 6 French sheath was placed. Real-time ultrasound guidance was utilized for vascular access including the acquisition of a permanent ultrasound image documenting patency of the accessed vessel. Next, using a micropuncture kit and the modified Seldinger technique, access was gained to the right common femoral vein and an 8 French sheath was placed. Real-time ultrasound guidance was utilized for vascular access including the acquisition of a permanent ultrasound image documenting patency of the accessed vessel. Through the arterial access and under fluoroscopy, a 4 [place] Berenstein 2 catheter was navigated over a 0.035" Terumo Glidewire into the aortic arch. The catheter was placed into the right common carotid artery and then advanced into the right internal carotid artery. Frontal and lateral angiograms of the head were obtained. The catheter was then retracted into the right external carotid artery and under fluoroscopic guidance, advanced into the left posterior auricular artery. Frontal and lateral angiograms of the head were obtained. The catheter was then placed into the internal maxillary artery. Frontal and lateral  angiograms of the head were obtained. Finally, the catheter was placed into the left occipital artery. Frontal and lateral angiograms of the head were obtained. Through venous access and under fluoroscopy, an 8 [place] Neuron Max guide catheter was navigated over a 6 [place] Berenstein 2 catheter and a 0.035" Terumo Glidewire into the superior vena cava. The catheter was placed into the right internal jugular vein and then advanced into the right jugular bulb. The diagnostic catheter was removed. Frontal and lateral venograms of the skull base were obtained. FINDINGS: 1. Normal caliber of the right common femoral artery, adequate for vascular access. 2. Normal caliber of the right common femoral artery, adequate for vascular access. 3. Right internal carotid artery angiograms showed brisk contrast opacification of the right MCA and ACA vascular  trees a right sigmoid sinus fistula is seen supplied by the lateral tentorial artery. Physiological venous drainage through the left transverse/sigmoid sinus with fistula draining into the right sigmoid sinus. 4. Right posterior auricular artery angiograms showed dural AV fistula supplied by posterior auricular artery branches at the right transverse-sigmoid junction with retrograde flow into the contralateral transverse sinus. 5. Right internal maxillary artery angiograms showed dural AV fistula supplied by petrous and petrosquamous branches for of the right middle meningeal artery in draining into the transverse-sigmoid sinus junction noting retrograde flow into the left transverse sinus. 6. Right occipital artery angiograms showed dural AV fistula supplied by branches of the occipital artery draining into the right transverse-sigmoid junction as well as in the sigmoid sinus near the jugular bulb. Retrograde flow into the torcular, proximal superior sagittal sinus and left transverse sinus are noted. 7. There appear to be a separate channel of the right sigmoid sinus draining  the fistula that is partially separated from the main sinus lumen. 8. Right jugular bulb venogram confirms location of the catheter within the jugular bulb with rapid washout. Prominent posterior cervical ectatic veins are noted. PROCEDURE: Through the arterial catheter placed in the right occipital artery, magnified frontal and lateral views of the head were obtained centered on the posterior fossa. Images were utilized as roadmap. Through the neuron max, a scepter balloon catheter was navigated over an Aristotle 14 micro guidewire into the right sigmoid sinus. Except the balloon was inflated and angiograms were obtained via right occipital artery contrast injection with frontal and lateral views the working projections. Angiogram confirmed the presence of 2 separate venous sinus channels with fistula draining into the smaller, most posteriorly located channel. Using biplane roadmap, an SL 10 microcatheter was navigated over a synchro support microguidewire into the right sigmoid posterior channel. Magnified angiograms with frontal and lateral views confirm the presence of the microcatheter within the separate channel. There are, coil embolization of this channel was performed via SL 10 microcatheter. Control angiograms were obtained after coil placement. A 3 mm x 6 cm 3D Axium coil, a 5 mm x 15 cm 3 Axium coil, a 2 mm x 8 cm helix Axium Prime coil, a 2.5 mm x 6 cm 3D Axium Prime coil, a 3 mm x 10 cm helix Axium Prime coil, a 3 mm x 10 cm you leaks Axium Prime coil, a 5 mm x 15 cm 3D Axium coil, a 6 mm x 20 cm 3D Axium coil, a 6 mm x 20 cm 3D Axium coil, a 7 mm x 30 cm 3D Axium Prime coil and a 5 mm x 20 cm 3D Axium Prime coil were implanted. An attempt to implant a 5 mm x 20 cm 3D Axium Prime coil resulted in coil stretching. This coil with the corresponding microcatheter were then retracted and removed. Then, using biplane roadmap guidance, a headway duo was navigated over a synchro 2 micro guidewire into the  right sigmoid sinus posterior pouch where it coiling was greater the performed as described above. Additional coils were placed followed by control angiograms obtained via right occipital artery contrast injection with frontal and lateral views of the head. A 2 mm x 4 cm helix Axium Prime coil, a 3 mm x 8 cm 3D Axium Prime coil, a 2.5 mm x 4 cm 3D Axium Prime coil and a 2 mm x 3 cm helix Axium Prime coil were implanted. Right occipital artery angiograms with magnified frontal and lateral views of the head no longer  shows early venous drainage into the right sigmoid sinus. Faint delayed contrast opacification at the transverse-sigmoid sinus is noted which appear to represent minimal residual fistula, likely representing less than 5% of the original fistula. The Berenstein 2 catheter was placed into the right internal maxillary artery. Angiogram showed no opacification of the right middle meningeal artery, except for its origin, likely related to stasis. Then, the catheter was placed into the right internal carotid artery angiograms show brisk opacification of the right ACA and MCA vascular tree without evidence of thromboembolic complication. No early opacification of the right sigmoid sinus seen via lateral tentorial artery which appear decreased in caliber compared to pre embolization angiogram. Physiological anterograde flow through the right transverse and sigmoid sinus is seen. The catheter was subsequently retracted into the innominate artery and advanced into the right subclavian artery and then into the cervical right vertebral artery. Frontal and lateral angiograms of the head were obtained. Early opacification of the right sigmoid sinus via posterior meningeal artery described on prior angiogram is no longer seen. Physiological anterograde flow through the right transverse and sigmoid sinus is seen. The arterial and venous catheters were subsequently withdrawn. Flat panel CT of the head was obtained and  post processed in a separate workstation with concurrent attending physician supervision. Selected images were sent to PACS. No evidence of hemorrhagic complication noted. Right common femoral artery angiogram was obtained in right anterior oblique view. The puncture is at the level of the common femoral artery. The artery has normal caliber, adequate for closure device. A 5 [place] Exoseal was utilized for arterial access closure. The venous sheath was removed. Manual pressure was held over the arterial and venous access site for approximately 20 minutes. Adequate hemostasis was achieved. IMPRESSION: 1. 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 Minimal residual fistula may be present at the transverse-sigmoid junction with delayed venous opacification, likely representing less than 5% of original fistula. 2. No evidence of thromboembolic or hemorrhagic complication on final angiograms and flat panel CT. PLAN: Patient transferred to ICU for overnight observation. Electronically Signed   By: Pedro Earls M.D.   On: 10/13/2021 16:50   IR Angiogram Follow Up Study  Result Date: 10/13/2021 INDICATION: 41 year old female with past medical history significant for chronic headache, IBS and thyroid disease presenting with pulsatile tinnitus with significant impact in her quality of life. MR angiogram performed September 10, 2021 was suggestive of a right transverse/sigmoid sinus dural AV fistula. 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 EXAM: ULTRASOUND-GUIDED VASCULAR ACCESS DIAGNOSTIC CEREBRAL ANGIOGRAM ENDOVASCULAR EMBOLIZATION OF DURAL ARTERIOVENOUS FISTULA FLAT PANEL HEAD CT COMPARISON:  Cerebral angiogram September 24, 2021. MEDICATIONS: Vancomycin 1 gm IV.  The antibiotic was administered within 1 hour of the procedure. ANESTHESIA/SEDATION: The procedure was performed under general anesthesia. CONTRAST:  125 mL Omnipaque 300 milligram/mL. FLUOROSCOPY: Radiation Exposure Index (as provided by the fluoroscopic device): 1,209 mGy Kerma. COMPLICATIONS: None immediate. TECHNIQUE: Informed written consent was obtained from the patient after a thorough discussion of the procedural risks, benefits and alternatives. All questions were addressed. Maximal Sterile Barrier Technique was utilized including caps, mask, sterile gowns, sterile gloves, sterile drape, hand hygiene and skin antiseptic. A timeout was performed prior to the initiation of the procedure. The right groin was prepped and draped in the usual sterile  fashion. Using a micropuncture kit and the modified Seldinger technique, access was gained to the right common femoral artery and a 6 French sheath was placed. Real-time ultrasound guidance was utilized for vascular access including the acquisition of a permanent ultrasound image documenting patency of the accessed vessel. Next, using a micropuncture kit and the modified Seldinger technique, access was gained to the right common femoral vein and an 8 French sheath was placed. Real-time ultrasound guidance was utilized for vascular access including the acquisition of a permanent ultrasound image documenting patency of the accessed vessel. Through the arterial access and under fluoroscopy, a 4 [place] Berenstein 2 catheter was navigated over a 0.035" Terumo Glidewire into the aortic arch. The catheter was placed into the right common carotid artery and then advanced into the right internal carotid artery. Frontal and lateral angiograms of the head were obtained. The catheter was then retracted into the right external carotid artery and under fluoroscopic guidance, advanced into the left posterior auricular artery. Frontal and lateral angiograms of the head were  obtained. The catheter was then placed into the internal maxillary artery. Frontal and lateral angiograms of the head were obtained. Finally, the catheter was placed into the left occipital artery. Frontal and lateral angiograms of the head were obtained. Through venous access and under fluoroscopy, an 8 [place] Neuron Max guide catheter was navigated over a 6 [place] Berenstein 2 catheter and a 0.035" Terumo Glidewire into the superior vena cava. The catheter was placed into the right internal jugular vein and then advanced into the right jugular bulb. The diagnostic catheter was removed. Frontal and lateral venograms of the skull base were obtained. FINDINGS: 1. Normal caliber of the right common femoral artery, adequate for vascular access. 2. Normal caliber of the right common femoral artery, adequate for vascular access. 3. Right internal carotid artery angiograms showed brisk contrast opacification of the right MCA and ACA vascular trees a right sigmoid sinus fistula is seen supplied by the lateral tentorial artery. Physiological venous drainage through the left transverse/sigmoid sinus with fistula draining into the right sigmoid sinus. 4. Right posterior auricular artery angiograms showed dural AV fistula supplied by posterior auricular artery branches at the right transverse-sigmoid junction with retrograde flow into the contralateral transverse sinus. 5. Right internal maxillary artery angiograms showed dural AV fistula supplied by petrous and petrosquamous branches for of the right middle meningeal artery in draining into the transverse-sigmoid sinus junction noting retrograde flow into the left transverse sinus. 6. Right occipital artery angiograms showed dural AV fistula supplied by branches of the occipital artery draining into the right transverse-sigmoid junction as well as in the sigmoid sinus near the jugular bulb. Retrograde flow into the torcular, proximal superior sagittal sinus and left  transverse sinus are noted. 7. There appear to be a separate channel of the right sigmoid sinus draining the fistula that is partially separated from the main sinus lumen. 8. Right jugular bulb venogram confirms location of the catheter within the jugular bulb with rapid washout. Prominent posterior cervical ectatic veins are noted. PROCEDURE: Through the arterial catheter placed in the right occipital artery, magnified frontal and lateral views of the head were obtained centered on the posterior fossa. Images were utilized as roadmap. Through the neuron max, a scepter balloon catheter was navigated over an Aristotle 14 micro guidewire into the right sigmoid sinus. Except the balloon was inflated and angiograms were obtained via right occipital artery contrast injection with frontal and lateral views the working projections. Angiogram confirmed the  presence of 2 separate venous sinus channels with fistula draining into the smaller, most posteriorly located channel. Using biplane roadmap, an SL 10 microcatheter was navigated over a synchro support microguidewire into the right sigmoid posterior channel. Magnified angiograms with frontal and lateral views confirm the presence of the microcatheter within the separate channel. There are, coil embolization of this channel was performed via SL 10 microcatheter. Control angiograms were obtained after coil placement. A 3 mm x 6 cm 3D Axium coil, a 5 mm x 15 cm 3 Axium coil, a 2 mm x 8 cm helix Axium Prime coil, a 2.5 mm x 6 cm 3D Axium Prime coil, a 3 mm x 10 cm helix Axium Prime coil, a 3 mm x 10 cm you leaks Axium Prime coil, a 5 mm x 15 cm 3D Axium coil, a 6 mm x 20 cm 3D Axium coil, a 6 mm x 20 cm 3D Axium coil, a 7 mm x 30 cm 3D Axium Prime coil and a 5 mm x 20 cm 3D Axium Prime coil were implanted. An attempt to implant a 5 mm x 20 cm 3D Axium Prime coil resulted in coil stretching. This coil with the corresponding microcatheter were then retracted and removed. Then,  using biplane roadmap guidance, a headway duo was navigated over a synchro 2 micro guidewire into the right sigmoid sinus posterior pouch where it coiling was greater the performed as described above. Additional coils were placed followed by control angiograms obtained via right occipital artery contrast injection with frontal and lateral views of the head. A 2 mm x 4 cm helix Axium Prime coil, a 3 mm x 8 cm 3D Axium Prime coil, a 2.5 mm x 4 cm 3D Axium Prime coil and a 2 mm x 3 cm helix Axium Prime coil were implanted. Right occipital artery angiograms with magnified frontal and lateral views of the head no longer shows early venous drainage into the right sigmoid sinus. Faint delayed contrast opacification at the transverse-sigmoid sinus is noted which appear to represent minimal residual fistula, likely representing less than 5% of the original fistula. The Berenstein 2 catheter was placed into the right internal maxillary artery. Angiogram showed no opacification of the right middle meningeal artery, except for its origin, likely related to stasis. Then, the catheter was placed into the right internal carotid artery angiograms show brisk opacification of the right ACA and MCA vascular tree without evidence of thromboembolic complication. No early opacification of the right sigmoid sinus seen via lateral tentorial artery which appear decreased in caliber compared to pre embolization angiogram. Physiological anterograde flow through the right transverse and sigmoid sinus is seen. The catheter was subsequently retracted into the innominate artery and advanced into the right subclavian artery and then into the cervical right vertebral artery. Frontal and lateral angiograms of the head were obtained. Early opacification of the right sigmoid sinus via posterior meningeal artery described on prior angiogram is no longer seen. Physiological anterograde flow through the right transverse and sigmoid sinus is seen. The  arterial and venous catheters were subsequently withdrawn. Flat panel CT of the head was obtained and post processed in a separate workstation with concurrent attending physician supervision. Selected images were sent to PACS. No evidence of hemorrhagic complication noted. Right common femoral artery angiogram was obtained in right anterior oblique view. The puncture is at the level of the common femoral artery. The artery has normal caliber, adequate for closure device. A 5 [place] Exoseal was utilized for arterial  access closure. The venous sheath was removed. Manual pressure was held over the arterial and venous access site for approximately 20 minutes. Adequate hemostasis was achieved. IMPRESSION: 1. 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 Minimal residual fistula may be present at the transverse-sigmoid junction with delayed venous opacification, likely representing less than 5% of original fistula. 2. No evidence of thromboembolic or hemorrhagic complication on final angiograms and flat panel CT. PLAN: Patient transferred to ICU for overnight observation. Electronically Signed   By: Pedro Earls M.D.   On: 10/13/2021 16:50   IR Angiogram Follow Up Study  Result Date: 10/13/2021 INDICATION: 41 year old female with past medical history significant for chronic headache, IBS and thyroid disease presenting with pulsatile tinnitus with significant impact in her quality of life. MR angiogram performed September 10, 2021 was suggestive of a right transverse/sigmoid sinus dural AV fistula. 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 EXAM: ULTRASOUND-GUIDED VASCULAR ACCESS DIAGNOSTIC CEREBRAL ANGIOGRAM ENDOVASCULAR EMBOLIZATION OF DURAL ARTERIOVENOUS FISTULA  FLAT PANEL HEAD CT COMPARISON:  Cerebral angiogram September 24, 2021. MEDICATIONS: Vancomycin 1 gm IV. The antibiotic was administered within 1 hour of the procedure. ANESTHESIA/SEDATION: The procedure was performed under general anesthesia. CONTRAST:  125 mL Omnipaque 300 milligram/mL. FLUOROSCOPY: Radiation Exposure Index (as provided by the fluoroscopic device): 1,209 mGy Kerma. COMPLICATIONS: None immediate. TECHNIQUE: Informed written consent was obtained from the patient after a thorough discussion of the procedural risks, benefits and alternatives. All questions were addressed. Maximal Sterile Barrier Technique was utilized including caps, mask, sterile gowns, sterile gloves, sterile drape, hand hygiene and skin antiseptic. A timeout was performed prior to the initiation of the procedure. The right groin was prepped and draped in the usual sterile fashion. Using a micropuncture kit and the modified Seldinger technique, access was gained to the right common femoral artery and a 6 French sheath was placed. Real-time ultrasound guidance was utilized for vascular access including the acquisition of a permanent ultrasound image documenting patency of the accessed vessel. Next, using a micropuncture kit and the modified Seldinger technique, access was gained to the right common femoral vein and an 8 French sheath was placed. Real-time ultrasound guidance was utilized for vascular access including the acquisition of a permanent ultrasound image documenting patency of the accessed vessel. Through the arterial access and under fluoroscopy, a 4 [place] Berenstein 2 catheter was navigated over a 0.035" Terumo Glidewire into the aortic arch. The catheter was placed into the right common carotid artery and then advanced into the right internal carotid artery. Frontal and lateral angiograms of the head were obtained. The catheter was then retracted into the right external carotid artery and under fluoroscopic guidance,  advanced into the left posterior auricular artery. Frontal and lateral angiograms of the head were obtained. The catheter was then placed into the internal maxillary artery. Frontal and lateral angiograms of the head were obtained. Finally, the catheter was placed into the left occipital artery. Frontal and lateral angiograms of the head were obtained. Through venous access and under fluoroscopy, an 8 [place] Neuron Max guide catheter was navigated over a 6 [place] Berenstein 2 catheter and a 0.035" Terumo Glidewire into the superior vena cava. The catheter was placed into the right internal jugular vein and then advanced into the right jugular bulb. The diagnostic catheter was removed.  Frontal and lateral venograms of the skull base were obtained. FINDINGS: 1. Normal caliber of the right common femoral artery, adequate for vascular access. 2. Normal caliber of the right common femoral artery, adequate for vascular access. 3. Right internal carotid artery angiograms showed brisk contrast opacification of the right MCA and ACA vascular trees a right sigmoid sinus fistula is seen supplied by the lateral tentorial artery. Physiological venous drainage through the left transverse/sigmoid sinus with fistula draining into the right sigmoid sinus. 4. Right posterior auricular artery angiograms showed dural AV fistula supplied by posterior auricular artery branches at the right transverse-sigmoid junction with retrograde flow into the contralateral transverse sinus. 5. Right internal maxillary artery angiograms showed dural AV fistula supplied by petrous and petrosquamous branches for of the right middle meningeal artery in draining into the transverse-sigmoid sinus junction noting retrograde flow into the left transverse sinus. 6. Right occipital artery angiograms showed dural AV fistula supplied by branches of the occipital artery draining into the right transverse-sigmoid junction as well as in the sigmoid sinus near the  jugular bulb. Retrograde flow into the torcular, proximal superior sagittal sinus and left transverse sinus are noted. 7. There appear to be a separate channel of the right sigmoid sinus draining the fistula that is partially separated from the main sinus lumen. 8. Right jugular bulb venogram confirms location of the catheter within the jugular bulb with rapid washout. Prominent posterior cervical ectatic veins are noted. PROCEDURE: Through the arterial catheter placed in the right occipital artery, magnified frontal and lateral views of the head were obtained centered on the posterior fossa. Images were utilized as roadmap. Through the neuron max, a scepter balloon catheter was navigated over an Aristotle 14 micro guidewire into the right sigmoid sinus. Except the balloon was inflated and angiograms were obtained via right occipital artery contrast injection with frontal and lateral views the working projections. Angiogram confirmed the presence of 2 separate venous sinus channels with fistula draining into the smaller, most posteriorly located channel. Using biplane roadmap, an SL 10 microcatheter was navigated over a synchro support microguidewire into the right sigmoid posterior channel. Magnified angiograms with frontal and lateral views confirm the presence of the microcatheter within the separate channel. There are, coil embolization of this channel was performed via SL 10 microcatheter. Control angiograms were obtained after coil placement. A 3 mm x 6 cm 3D Axium coil, a 5 mm x 15 cm 3 Axium coil, a 2 mm x 8 cm helix Axium Prime coil, a 2.5 mm x 6 cm 3D Axium Prime coil, a 3 mm x 10 cm helix Axium Prime coil, a 3 mm x 10 cm you leaks Axium Prime coil, a 5 mm x 15 cm 3D Axium coil, a 6 mm x 20 cm 3D Axium coil, a 6 mm x 20 cm 3D Axium coil, a 7 mm x 30 cm 3D Axium Prime coil and a 5 mm x 20 cm 3D Axium Prime coil were implanted. An attempt to implant a 5 mm x 20 cm 3D Axium Prime coil resulted in coil  stretching. This coil with the corresponding microcatheter were then retracted and removed. Then, using biplane roadmap guidance, a headway duo was navigated over a synchro 2 micro guidewire into the right sigmoid sinus posterior pouch where it coiling was greater the performed as described above. Additional coils were placed followed by control angiograms obtained via right occipital artery contrast injection with frontal and lateral views of the head. A 2 mm x  4 cm helix Axium Prime coil, a 3 mm x 8 cm 3D Axium Prime coil, a 2.5 mm x 4 cm 3D Axium Prime coil and a 2 mm x 3 cm helix Axium Prime coil were implanted. Right occipital artery angiograms with magnified frontal and lateral views of the head no longer shows early venous drainage into the right sigmoid sinus. Faint delayed contrast opacification at the transverse-sigmoid sinus is noted which appear to represent minimal residual fistula, likely representing less than 5% of the original fistula. The Berenstein 2 catheter was placed into the right internal maxillary artery. Angiogram showed no opacification of the right middle meningeal artery, except for its origin, likely related to stasis. Then, the catheter was placed into the right internal carotid artery angiograms show brisk opacification of the right ACA and MCA vascular tree without evidence of thromboembolic complication. No early opacification of the right sigmoid sinus seen via lateral tentorial artery which appear decreased in caliber compared to pre embolization angiogram. Physiological anterograde flow through the right transverse and sigmoid sinus is seen. The catheter was subsequently retracted into the innominate artery and advanced into the right subclavian artery and then into the cervical right vertebral artery. Frontal and lateral angiograms of the head were obtained. Early opacification of the right sigmoid sinus via posterior meningeal artery described on prior angiogram is no longer  seen. Physiological anterograde flow through the right transverse and sigmoid sinus is seen. The arterial and venous catheters were subsequently withdrawn. Flat panel CT of the head was obtained and post processed in a separate workstation with concurrent attending physician supervision. Selected images were sent to PACS. No evidence of hemorrhagic complication noted. Right common femoral artery angiogram was obtained in right anterior oblique view. The puncture is at the level of the common femoral artery. The artery has normal caliber, adequate for closure device. A 5 [place] Exoseal was utilized for arterial access closure. The venous sheath was removed. Manual pressure was held over the arterial and venous access site for approximately 20 minutes. Adequate hemostasis was achieved. IMPRESSION: 1. 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 Minimal residual fistula may be present at the transverse-sigmoid junction with delayed venous opacification, likely representing less than 5% of original fistula. 2. No evidence of thromboembolic or hemorrhagic complication on final angiograms and flat panel CT. PLAN: Patient transferred to ICU for overnight observation. Electronically Signed   By: Pedro Earls M.D.   On: 10/13/2021 16:50   IR CT Head Ltd  Result Date: 10/13/2021 INDICATION: 41 year old female with past medical history significant for chronic headache, IBS and thyroid disease presenting with pulsatile tinnitus with significant impact in her quality of life. MR angiogram performed September 10, 2021 was suggestive of a right transverse/sigmoid sinus dural AV fistula. 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 EXAM: ULTRASOUND-GUIDED VASCULAR ACCESS  DIAGNOSTIC CEREBRAL ANGIOGRAM ENDOVASCULAR EMBOLIZATION OF DURAL ARTERIOVENOUS FISTULA FLAT PANEL HEAD CT COMPARISON:  Cerebral angiogram September 24, 2021. MEDICATIONS: Vancomycin 1 gm IV. The antibiotic was administered within 1 hour of the procedure. ANESTHESIA/SEDATION: The procedure was performed under general anesthesia. CONTRAST:  125 mL Omnipaque 300 milligram/mL. FLUOROSCOPY: Radiation Exposure Index (as provided by the fluoroscopic device): 1,209 mGy Kerma. COMPLICATIONS: None immediate. TECHNIQUE: Informed written consent was obtained from the patient after a thorough discussion  of the procedural risks, benefits and alternatives. All questions were addressed. Maximal Sterile Barrier Technique was utilized including caps, mask, sterile gowns, sterile gloves, sterile drape, hand hygiene and skin antiseptic. A timeout was performed prior to the initiation of the procedure. The right groin was prepped and draped in the usual sterile fashion. Using a micropuncture kit and the modified Seldinger technique, access was gained to the right common femoral artery and a 6 French sheath was placed. Real-time ultrasound guidance was utilized for vascular access including the acquisition of a permanent ultrasound image documenting patency of the accessed vessel. Next, using a micropuncture kit and the modified Seldinger technique, access was gained to the right common femoral vein and an 8 French sheath was placed. Real-time ultrasound guidance was utilized for vascular access including the acquisition of a permanent ultrasound image documenting patency of the accessed vessel. Through the arterial access and under fluoroscopy, a 4 [place] Berenstein 2 catheter was navigated over a 0.035" Terumo Glidewire into the aortic arch. The catheter was placed into the right common carotid artery and then advanced into the right internal carotid artery. Frontal and lateral angiograms of the head were obtained. The catheter was then  retracted into the right external carotid artery and under fluoroscopic guidance, advanced into the left posterior auricular artery. Frontal and lateral angiograms of the head were obtained. The catheter was then placed into the internal maxillary artery. Frontal and lateral angiograms of the head were obtained. Finally, the catheter was placed into the left occipital artery. Frontal and lateral angiograms of the head were obtained. Through venous access and under fluoroscopy, an 8 [place] Neuron Max guide catheter was navigated over a 6 [place] Berenstein 2 catheter and a 0.035" Terumo Glidewire into the superior vena cava. The catheter was placed into the right internal jugular vein and then advanced into the right jugular bulb. The diagnostic catheter was removed. Frontal and lateral venograms of the skull base were obtained. FINDINGS: 1. Normal caliber of the right common femoral artery, adequate for vascular access. 2. Normal caliber of the right common femoral artery, adequate for vascular access. 3. Right internal carotid artery angiograms showed brisk contrast opacification of the right MCA and ACA vascular trees a right sigmoid sinus fistula is seen supplied by the lateral tentorial artery. Physiological venous drainage through the left transverse/sigmoid sinus with fistula draining into the right sigmoid sinus. 4. Right posterior auricular artery angiograms showed dural AV fistula supplied by posterior auricular artery branches at the right transverse-sigmoid junction with retrograde flow into the contralateral transverse sinus. 5. Right internal maxillary artery angiograms showed dural AV fistula supplied by petrous and petrosquamous branches for of the right middle meningeal artery in draining into the transverse-sigmoid sinus junction noting retrograde flow into the left transverse sinus. 6. Right occipital artery angiograms showed dural AV fistula supplied by branches of the occipital artery draining  into the right transverse-sigmoid junction as well as in the sigmoid sinus near the jugular bulb. Retrograde flow into the torcular, proximal superior sagittal sinus and left transverse sinus are noted. 7. There appear to be a separate channel of the right sigmoid sinus draining the fistula that is partially separated from the main sinus lumen. 8. Right jugular bulb venogram confirms location of the catheter within the jugular bulb with rapid washout. Prominent posterior cervical ectatic veins are noted. PROCEDURE: Through the arterial catheter placed in the right occipital artery, magnified frontal and lateral views of the head were obtained centered on the posterior  fossa. Images were utilized as roadmap. Through the neuron max, a scepter balloon catheter was navigated over an Aristotle 14 micro guidewire into the right sigmoid sinus. Except the balloon was inflated and angiograms were obtained via right occipital artery contrast injection with frontal and lateral views the working projections. Angiogram confirmed the presence of 2 separate venous sinus channels with fistula draining into the smaller, most posteriorly located channel. Using biplane roadmap, an SL 10 microcatheter was navigated over a synchro support microguidewire into the right sigmoid posterior channel. Magnified angiograms with frontal and lateral views confirm the presence of the microcatheter within the separate channel. There are, coil embolization of this channel was performed via SL 10 microcatheter. Control angiograms were obtained after coil placement. A 3 mm x 6 cm 3D Axium coil, a 5 mm x 15 cm 3 Axium coil, a 2 mm x 8 cm helix Axium Prime coil, a 2.5 mm x 6 cm 3D Axium Prime coil, a 3 mm x 10 cm helix Axium Prime coil, a 3 mm x 10 cm you leaks Axium Prime coil, a 5 mm x 15 cm 3D Axium coil, a 6 mm x 20 cm 3D Axium coil, a 6 mm x 20 cm 3D Axium coil, a 7 mm x 30 cm 3D Axium Prime coil and a 5 mm x 20 cm 3D Axium Prime coil were  implanted. An attempt to implant a 5 mm x 20 cm 3D Axium Prime coil resulted in coil stretching. This coil with the corresponding microcatheter were then retracted and removed. Then, using biplane roadmap guidance, a headway duo was navigated over a synchro 2 micro guidewire into the right sigmoid sinus posterior pouch where it coiling was greater the performed as described above. Additional coils were placed followed by control angiograms obtained via right occipital artery contrast injection with frontal and lateral views of the head. A 2 mm x 4 cm helix Axium Prime coil, a 3 mm x 8 cm 3D Axium Prime coil, a 2.5 mm x 4 cm 3D Axium Prime coil and a 2 mm x 3 cm helix Axium Prime coil were implanted. Right occipital artery angiograms with magnified frontal and lateral views of the head no longer shows early venous drainage into the right sigmoid sinus. Faint delayed contrast opacification at the transverse-sigmoid sinus is noted which appear to represent minimal residual fistula, likely representing less than 5% of the original fistula. The Berenstein 2 catheter was placed into the right internal maxillary artery. Angiogram showed no opacification of the right middle meningeal artery, except for its origin, likely related to stasis. Then, the catheter was placed into the right internal carotid artery angiograms show brisk opacification of the right ACA and MCA vascular tree without evidence of thromboembolic complication. No early opacification of the right sigmoid sinus seen via lateral tentorial artery which appear decreased in caliber compared to pre embolization angiogram. Physiological anterograde flow through the right transverse and sigmoid sinus is seen. The catheter was subsequently retracted into the innominate artery and advanced into the right subclavian artery and then into the cervical right vertebral artery. Frontal and lateral angiograms of the head were obtained. Early opacification of the right  sigmoid sinus via posterior meningeal artery described on prior angiogram is no longer seen. Physiological anterograde flow through the right transverse and sigmoid sinus is seen. The arterial and venous catheters were subsequently withdrawn. Flat panel CT of the head was obtained and post processed in a separate workstation with concurrent attending physician  supervision. Selected images were sent to PACS. No evidence of hemorrhagic complication noted. Right common femoral artery angiogram was obtained in right anterior oblique view. The puncture is at the level of the common femoral artery. The artery has normal caliber, adequate for closure device. A 5 [place] Exoseal was utilized for arterial access closure. The venous sheath was removed. Manual pressure was held over the arterial and venous access site for approximately 20 minutes. Adequate hemostasis was achieved. IMPRESSION: 1. 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 Minimal residual fistula may be present at the transverse-sigmoid junction with delayed venous opacification, likely representing less than 5% of original fistula. 2. No evidence of thromboembolic or hemorrhagic complication on final angiograms and flat panel CT. PLAN: Patient transferred to ICU for overnight observation. Electronically Signed   By: Pedro Earls M.D.   On: 10/13/2021 16:50   IR US Guide Vasc Access Right  Result Date: 10/13/2021 INDICATION: 41 year old female with past medical history significant for chronic headache, IBS and thyroid disease presenting with pulsatile tinnitus with significant impact in her quality of life. MR angiogram performed September 10, 2021 was suggestive of a right transverse/sigmoid sinus dural AV fistula. 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 EXAM: ULTRASOUND-GUIDED VASCULAR ACCESS DIAGNOSTIC CEREBRAL ANGIOGRAM ENDOVASCULAR EMBOLIZATION OF DURAL ARTERIOVENOUS FISTULA FLAT PANEL HEAD CT COMPARISON:  Cerebral angiogram September 24, 2021. MEDICATIONS: Vancomycin 1 gm IV. The antibiotic was administered within 1 hour of the procedure. ANESTHESIA/SEDATION: The procedure was performed under general anesthesia. CONTRAST:  125 mL Omnipaque 300 milligram/mL. FLUOROSCOPY: Radiation Exposure Index (as provided by the fluoroscopic device): 1,209 mGy Kerma. COMPLICATIONS: None immediate. TECHNIQUE: Informed written consent was obtained from the patient after a thorough discussion of the procedural risks, benefits and alternatives. All questions were addressed. Maximal Sterile Barrier Technique was utilized including caps, mask, sterile gowns, sterile gloves, sterile drape, hand hygiene and skin antiseptic. A timeout was performed prior to the initiation of the procedure. The right groin was prepped and draped in the usual sterile fashion. Using a micropuncture kit and the modified Seldinger technique, access was gained to the right common femoral artery and a 6 French sheath was placed. Real-time ultrasound guidance was utilized for vascular access including the acquisition of a permanent ultrasound image documenting patency of the accessed vessel. Next, using a micropuncture kit and the modified Seldinger technique, access was gained to the right common femoral vein and an 8 French sheath was placed. Real-time ultrasound guidance was utilized for vascular access including the acquisition of a permanent ultrasound image documenting patency of the accessed vessel. Through the arterial access and under fluoroscopy, a 4 [place] Berenstein 2 catheter was navigated over a 0.035" Terumo Glidewire into the aortic arch. The catheter was placed into the right common carotid artery and then advanced into the right  internal carotid artery. Frontal and lateral angiograms of the head were obtained. The catheter was then retracted into the right external carotid artery and under fluoroscopic guidance, advanced into the left posterior auricular artery. Frontal and lateral angiograms of the head were obtained. The catheter was then placed into the internal maxillary artery. Frontal and lateral angiograms of the head were obtained. Finally, the catheter was placed into the left occipital artery. Frontal and lateral angiograms of the head were obtained. Through  venous access and under fluoroscopy, an 8 [place] Neuron Max guide catheter was navigated over a 6 [place] Berenstein 2 catheter and a 0.035" Terumo Glidewire into the superior vena cava. The catheter was placed into the right internal jugular vein and then advanced into the right jugular bulb. The diagnostic catheter was removed. Frontal and lateral venograms of the skull base were obtained. FINDINGS: 1. Normal caliber of the right common femoral artery, adequate for vascular access. 2. Normal caliber of the right common femoral artery, adequate for vascular access. 3. Right internal carotid artery angiograms showed brisk contrast opacification of the right MCA and ACA vascular trees a right sigmoid sinus fistula is seen supplied by the lateral tentorial artery. Physiological venous drainage through the left transverse/sigmoid sinus with fistula draining into the right sigmoid sinus. 4. Right posterior auricular artery angiograms showed dural AV fistula supplied by posterior auricular artery branches at the right transverse-sigmoid junction with retrograde flow into the contralateral transverse sinus. 5. Right internal maxillary artery angiograms showed dural AV fistula supplied by petrous and petrosquamous branches for of the right middle meningeal artery in draining into the transverse-sigmoid sinus junction noting retrograde flow into the left transverse sinus. 6. Right  occipital artery angiograms showed dural AV fistula supplied by branches of the occipital artery draining into the right transverse-sigmoid junction as well as in the sigmoid sinus near the jugular bulb. Retrograde flow into the torcular, proximal superior sagittal sinus and left transverse sinus are noted. 7. There appear to be a separate channel of the right sigmoid sinus draining the fistula that is partially separated from the main sinus lumen. 8. Right jugular bulb venogram confirms location of the catheter within the jugular bulb with rapid washout. Prominent posterior cervical ectatic veins are noted. PROCEDURE: Through the arterial catheter placed in the right occipital artery, magnified frontal and lateral views of the head were obtained centered on the posterior fossa. Images were utilized as roadmap. Through the neuron max, a scepter balloon catheter was navigated over an Aristotle 14 micro guidewire into the right sigmoid sinus. Except the balloon was inflated and angiograms were obtained via right occipital artery contrast injection with frontal and lateral views the working projections. Angiogram confirmed the presence of 2 separate venous sinus channels with fistula draining into the smaller, most posteriorly located channel. Using biplane roadmap, an SL 10 microcatheter was navigated over a synchro support microguidewire into the right sigmoid posterior channel. Magnified angiograms with frontal and lateral views confirm the presence of the microcatheter within the separate channel. There are, coil embolization of this channel was performed via SL 10 microcatheter. Control angiograms were obtained after coil placement. A 3 mm x 6 cm 3D Axium coil, a 5 mm x 15 cm 3 Axium coil, a 2 mm x 8 cm helix Axium Prime coil, a 2.5 mm x 6 cm 3D Axium Prime coil, a 3 mm x 10 cm helix Axium Prime coil, a 3 mm x 10 cm you leaks Axium Prime coil, a 5 mm x 15 cm 3D Axium coil, a 6 mm x 20 cm 3D Axium coil, a 6 mm x 20  cm 3D Axium coil, a 7 mm x 30 cm 3D Axium Prime coil and a 5 mm x 20 cm 3D Axium Prime coil were implanted. An attempt to implant a 5 mm x 20 cm 3D Axium Prime coil resulted in coil stretching. This coil with the corresponding microcatheter were then retracted and removed. Then, using biplane roadmap guidance, a  headway duo was navigated over a synchro 2 micro guidewire into the right sigmoid sinus posterior pouch where it coiling was greater the performed as described above. Additional coils were placed followed by control angiograms obtained via right occipital artery contrast injection with frontal and lateral views of the head. A 2 mm x 4 cm helix Axium Prime coil, a 3 mm x 8 cm 3D Axium Prime coil, a 2.5 mm x 4 cm 3D Axium Prime coil and a 2 mm x 3 cm helix Axium Prime coil were implanted. Right occipital artery angiograms with magnified frontal and lateral views of the head no longer shows early venous drainage into the right sigmoid sinus. Faint delayed contrast opacification at the transverse-sigmoid sinus is noted which appear to represent minimal residual fistula, likely representing less than 5% of the original fistula. The Berenstein 2 catheter was placed into the right internal maxillary artery. Angiogram showed no opacification of the right middle meningeal artery, except for its origin, likely related to stasis. Then, the catheter was placed into the right internal carotid artery angiograms show brisk opacification of the right ACA and MCA vascular tree without evidence of thromboembolic complication. No early opacification of the right sigmoid sinus seen via lateral tentorial artery which appear decreased in caliber compared to pre embolization angiogram. Physiological anterograde flow through the right transverse and sigmoid sinus is seen. The catheter was subsequently retracted into the innominate artery and advanced into the right subclavian artery and then into the cervical right vertebral  artery. Frontal and lateral angiograms of the head were obtained. Early opacification of the right sigmoid sinus via posterior meningeal artery described on prior angiogram is no longer seen. Physiological anterograde flow through the right transverse and sigmoid sinus is seen. The arterial and venous catheters were subsequently withdrawn. Flat panel CT of the head was obtained and post processed in a separate workstation with concurrent attending physician supervision. Selected images were sent to PACS. No evidence of hemorrhagic complication noted. Right common femoral artery angiogram was obtained in right anterior oblique view. The puncture is at the level of the common femoral artery. The artery has normal caliber, adequate for closure device. A 5 [place] Exoseal was utilized for arterial access closure. The venous sheath was removed. Manual pressure was held over the arterial and venous access site for approximately 20 minutes. Adequate hemostasis was achieved. IMPRESSION: 1. 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 Minimal residual fistula may be present at the transverse-sigmoid junction with delayed venous opacification, likely representing less than 5% of original fistula. 2. No evidence of thromboembolic or hemorrhagic complication on final angiograms and flat panel CT. PLAN: Patient transferred to ICU for overnight observation. Electronically Signed   By: Pedro Earls M.D.   On: 10/13/2021 16:50   IR US Guide Vasc Access Right  Result Date: 09/24/2021 INDICATION: 41 year old female with past medical history significant for chronic headache, IBS and thyroid disease presenting with pulsatile tinnitus. MR angiogram performed September 10, 2021 was suggestive of a right transverse sinus dural AV fistula. She comes to our service  today for a diagnostic cerebral angiogram to evaluate MRI findings. EXAM: ULTRASOUND-GUIDED VASCULAR ACCESS DIAGNOSTIC CEREBRAL ANGIOGRAM COMPARISON:  MRI/MR angiogram of the head and neck September 10, 2021. MEDICATIONS: 5,000 IU heparin, 5 mg Verapamil and 400 mcg nitroglycerin ANESTHESIA/SEDATION: Moderate (conscious) sedation was employed during this procedure. A total  of Versed 1 mg and Fentanyl 25 mcg was administered intravenously by the radiology nurse. Total intra-service moderate Sedation Time: 67 minutes. The patient's level of consciousness and vital signs were monitored continuously by radiology nursing throughout the procedure under my direct supervision. CONTRAST:  90 mL of Omnipaque 300 milligram/mL FLUOROSCOPY TIME:  Fluoroscopy Time: 14 minutes 36 seconds (781 mGy). COMPLICATIONS: None immediate. TECHNIQUE: Informed written consent was obtained from the patient after a thorough discussion of the procedural risks, benefits and alternatives. All questions were addressed. Maximal Sterile Barrier Technique was utilized including caps, mask, sterile gowns, sterile gloves, sterile drape, hand hygiene and skin antiseptic. A timeout was performed prior to the initiation of the procedure. Using the modified Seldinger technique and a micropuncture kit, access was gained to the right radial artery at the wrist and a 5 French sheath was placed. Real-time ultrasound guidance was utilized for vascular access including the acquisition of a permanent ultrasound image documenting patency of the accessed vessel. Slow intra arterial infusion of 5,000 IU heparin, 5 mg Verapamil and 200 mcg nitroglycerin diluted in patient's own blood was performed. No significant fluctuation in patient's blood pressure seen. Then, a right radial artery angiogram was obtained via sheath side port. Normal brachial artery branching pattern seen. No significant anatomical variation. The right radial artery caliber is adequate for vascular  access. Next, a 5 [place] Simmons 2 glide catheter was navigated over a 0.035" Terumo Glidewire into the right subclavian artery under fluoroscopic guidance. Using roadmap guidance, the catheter was advanced into the right vertebral artery. Frontal and lateral angiograms of the head were obtained followed by magnified right anterior oblique and magnified lateral views of the skull base. The catheter was then navigated into the aortic arch where the catheter tip was reformed. The catheter was then placed into the left common carotid artery. Frontal and lateral angiograms of the neck were obtained. Using biplane roadmap, the catheter was navigated into the left internal carotid artery. Frontal and lateral angiograms of the head were obtained. The catheter was then retracted into the left common carotid artery. Using biplane roadmap, the catheter was advanced into the left external carotid artery. Frontal and lateral angiograms of the head were obtained. Next, the catheter was placed into the left subclavian artery. Using roadmap guidance, the catheter was placed into the left vertebral artery. Frontal and lateral angiograms of the head were obtained. The catheter was then placed into the right common carotid artery. Frontal and lateral angiograms of the neck were obtained. Using biplane roadmap, the catheter was advanced into the right internal carotid artery. Frontal and lateral angiograms of the head were obtained. The catheter was retracted into the right common carotid artery and under biplane roadmap the catheter was advanced into the right internal maxillary artery. Frontal, lateral and bilateral magnified oblique views of the head obtained. Then, the catheter was retracted into the proximal right external carotid artery. Frontal and lateral angiograms of the head were obtained. The catheter was then retracted to the level of the right occipital artery. Frontal and lateral angiograms of the head were obtained.  The catheter was retracted into the mid right radial artery. 200 mcg of nitroglycerin was injected 4 vasospasm treatment. The catheter was subsequently withdrawn. An inflatable band was placed and inflated over the right wrist access site. The vascular sheath was withdrawn and the band was slowly deflated until brisk flow was noted through the arteriotomy site. At this point, the band was reinflated with additional 3 cc  of air to obtain patent hemostasis. FINDINGS: Right radial artery ultrasound and right radial artery angiogram: The caliber of the distal right radial artery is appropriate for angiogram access. The right radial artery and the right ulnar artery have normal course and caliber. No significant anatomical variants noted. Right vertebral artery angiograms: The right vertebral artery, basilar artery, and bilateral posterior cerebral arteries are unremarkable. Luminal caliber is smooth and tapering. No aneurysms are seen. 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 Visualized dural sinuses are patent. Left CCA angiograms: Cervical angiograms show normal course and caliber of the visualized left common carotid and internal carotid arteries. There are no significant stenoses. Left ICA angiograms: There is brisk vascular contrast filling of the left ACA and MCA vascular trees. Luminal caliber is smooth and tapering. No aneurysms or abnormally high-flow, early draining veins are seen. No regions of abnormal hypervascularity are noted. No venous drainage in the to the right transverse or sigmoid sinus. The left transverse and sigmoid sinuses are widely patent. Left ECA angiograms: No early venous drainage was noted. The visualized branches of the left external carotid artery are unremarkable. Left vertebral artery angiograms: The left vertebral artery, basilar artery, and bilateral posterior cerebral arteries are unremarkable. Luminal caliber is  smooth and tapering. No aneurysms are seen. Right transverse/sigmoid sinus dural AV fistula in supplied by right posterior meningeal artery as well as right AICA and SCA branches. Normal venous drainage is for [place] through the left transverse and sigmoid sinus. Right CCA angiograms: Cervical angiograms show normal course and caliber of the visualized right common carotid and internal carotid arteries. There are no significant stenoses. Right ICA angiograms: There is brisk vascular contrast filling of the right ACA and MCA vascular trees. Luminal caliber is smooth and tapering. No aneurysms are seen. No regions of abnormal hypervascularity are noted. Faint early opacification of the right transverse sinus is seen via prominent artery of Bernasconi and Cassinari. Preferential interior drainage into the sphenoid parietal sinus and posteriorly into the left transverse and sigmoid sinus. Right ECA angiograms: Prominent dural AV fistula seen in the left transverse/sigmoid sinus supplied by branches of the right middle meningeal artery, posterior ring level artery and occipital artery. The venous drainage is anterograde. No evidence of cortical venous reflux. PROCEDURE: No intervention performed. 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 Venous drainage remains anterograde with no evidence of cortical venous reflux. Physiological venous drainage via left transverse and sigmoid sinus as well as bilateral sphenoparietal sinuses. PLAN: Patient will return for office consultation to discuss dural AV fistula management. Electronically Signed   By: Pedro Earls M.D.   On: 09/24/2021 17:23   IR ANGIO INTRA EXTRACRAN SEL INTERNAL CAROTID BILAT MOD SED  Result Date: 09/24/2021 INDICATION: 41 year old female with past medical  history significant for chronic headache, IBS and thyroid disease presenting with pulsatile tinnitus. MR angiogram performed September 10, 2021 was suggestive of a right transverse sinus dural AV fistula. She comes to our service today for a diagnostic cerebral angiogram to evaluate MRI findings. EXAM: ULTRASOUND-GUIDED VASCULAR ACCESS DIAGNOSTIC CEREBRAL ANGIOGRAM COMPARISON:  MRI/MR angiogram of the head and neck September 10, 2021. MEDICATIONS: 5,000 IU heparin, 5 mg Verapamil and 400 mcg nitroglycerin ANESTHESIA/SEDATION: Moderate (conscious) sedation was employed during this procedure. A total of Versed 1 mg  and Fentanyl 25 mcg was administered intravenously by the radiology nurse. Total intra-service moderate Sedation Time: 67 minutes. The patient's level of consciousness and vital signs were monitored continuously by radiology nursing throughout the procedure under my direct supervision. CONTRAST:  90 mL of Omnipaque 300 milligram/mL FLUOROSCOPY TIME:  Fluoroscopy Time: 14 minutes 36 seconds (781 mGy). COMPLICATIONS: None immediate. TECHNIQUE: Informed written consent was obtained from the patient after a thorough discussion of the procedural risks, benefits and alternatives. All questions were addressed. Maximal Sterile Barrier Technique was utilized including caps, mask, sterile gowns, sterile gloves, sterile drape, hand hygiene and skin antiseptic. A timeout was performed prior to the initiation of the procedure. Using the modified Seldinger technique and a micropuncture kit, access was gained to the right radial artery at the wrist and a 5 French sheath was placed. Real-time ultrasound guidance was utilized for vascular access including the acquisition of a permanent ultrasound image documenting patency of the accessed vessel. Slow intra arterial infusion of 5,000 IU heparin, 5 mg Verapamil and 200 mcg nitroglycerin diluted in patient's own blood was performed. No significant fluctuation in patient's blood  pressure seen. Then, a right radial artery angiogram was obtained via sheath side port. Normal brachial artery branching pattern seen. No significant anatomical variation. The right radial artery caliber is adequate for vascular access. Next, a 5 [place] Simmons 2 glide catheter was navigated over a 0.035" Terumo Glidewire into the right subclavian artery under fluoroscopic guidance. Using roadmap guidance, the catheter was advanced into the right vertebral artery. Frontal and lateral angiograms of the head were obtained followed by magnified right anterior oblique and magnified lateral views of the skull base. The catheter was then navigated into the aortic arch where the catheter tip was reformed. The catheter was then placed into the left common carotid artery. Frontal and lateral angiograms of the neck were obtained. Using biplane roadmap, the catheter was navigated into the left internal carotid artery. Frontal and lateral angiograms of the head were obtained. The catheter was then retracted into the left common carotid artery. Using biplane roadmap, the catheter was advanced into the left external carotid artery. Frontal and lateral angiograms of the head were obtained. Next, the catheter was placed into the left subclavian artery. Using roadmap guidance, the catheter was placed into the left vertebral artery. Frontal and lateral angiograms of the head were obtained. The catheter was then placed into the right common carotid artery. Frontal and lateral angiograms of the neck were obtained. Using biplane roadmap, the catheter was advanced into the right internal carotid artery. Frontal and lateral angiograms of the head were obtained. The catheter was retracted into the right common carotid artery and under biplane roadmap the catheter was advanced into the right internal maxillary artery. Frontal, lateral and bilateral magnified oblique views of the head obtained. Then, the catheter was retracted into the  proximal right external carotid artery. Frontal and lateral angiograms of the head were obtained. The catheter was then retracted to the level of the right occipital artery. Frontal and lateral angiograms of the head were obtained. The catheter was retracted into the mid right radial artery. 200 mcg of nitroglycerin was injected 4 vasospasm treatment. The catheter was subsequently withdrawn. An inflatable band was placed and inflated over the right wrist access site. The vascular sheath was withdrawn and the band was slowly deflated until brisk flow was noted through the arteriotomy site. At this point, the band was reinflated with additional 3 cc of air to obtain  patent hemostasis. FINDINGS: Right radial artery ultrasound and right radial artery angiogram: The caliber of the distal right radial artery is appropriate for angiogram access. The right radial artery and the right ulnar artery have normal course and caliber. No significant anatomical variants noted. Right vertebral artery angiograms: The right vertebral artery, basilar artery, and bilateral posterior cerebral arteries are unremarkable. Luminal caliber is smooth and tapering. No aneurysms are seen. 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 Visualized dural sinuses are patent. Left CCA angiograms: Cervical angiograms show normal course and caliber of the visualized left common carotid and internal carotid arteries. There are no significant stenoses. Left ICA angiograms: There is brisk vascular contrast filling of the left ACA and MCA vascular trees. Luminal caliber is smooth and tapering. No aneurysms or abnormally high-flow, early draining veins are seen. No regions of abnormal hypervascularity are noted. No venous drainage in the to the right transverse or sigmoid sinus. The left transverse and sigmoid sinuses are widely patent. Left ECA angiograms: No early venous drainage was noted. The  visualized branches of the left external carotid artery are unremarkable. Left vertebral artery angiograms: The left vertebral artery, basilar artery, and bilateral posterior cerebral arteries are unremarkable. Luminal caliber is smooth and tapering. No aneurysms are seen. Right transverse/sigmoid sinus dural AV fistula in supplied by right posterior meningeal artery as well as right AICA and SCA branches. Normal venous drainage is for [place] through the left transverse and sigmoid sinus. Right CCA angiograms: Cervical angiograms show normal course and caliber of the visualized right common carotid and internal carotid arteries. There are no significant stenoses. Right ICA angiograms: There is brisk vascular contrast filling of the right ACA and MCA vascular trees. Luminal caliber is smooth and tapering. No aneurysms are seen. No regions of abnormal hypervascularity are noted. Faint early opacification of the right transverse sinus is seen via prominent artery of Bernasconi and Cassinari. Preferential interior drainage into the sphenoid parietal sinus and posteriorly into the left transverse and sigmoid sinus. Right ECA angiograms: Prominent dural AV fistula seen in the left transverse/sigmoid sinus supplied by branches of the right middle meningeal artery, posterior ring level artery and occipital artery. The venous drainage is anterograde. No evidence of cortical venous reflux. PROCEDURE: No intervention performed. 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 Venous drainage remains anterograde with no evidence of cortical venous reflux. Physiological venous drainage via left transverse and sigmoid sinus as well as bilateral sphenoparietal sinuses. PLAN: Patient will return for office consultation to discuss dural AV fistula  management. Electronically Signed   By: Pedro Earls M.D.   On: 09/24/2021 17:23   IR ANGIO VERTEBRAL SEL SUBCLAVIAN INNOMINATE UNI R MOD SED  Result Date: 10/13/2021 INDICATION: 41 year old female with past medical history significant for chronic headache, IBS and thyroid disease presenting with pulsatile tinnitus with significant impact in her quality of life. MR angiogram performed September 10, 2021 was suggestive of a right transverse/sigmoid sinus dural AV fistula. 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 EXAM: ULTRASOUND-GUIDED VASCULAR ACCESS DIAGNOSTIC CEREBRAL ANGIOGRAM ENDOVASCULAR EMBOLIZATION OF DURAL ARTERIOVENOUS FISTULA FLAT PANEL HEAD CT COMPARISON:  Cerebral angiogram September 24, 2021. MEDICATIONS: Vancomycin 1 gm IV. The antibiotic was administered within 1  hour of the procedure. ANESTHESIA/SEDATION: The procedure was performed under general anesthesia. CONTRAST:  125 mL Omnipaque 300 milligram/mL. FLUOROSCOPY: Radiation Exposure Index (as provided by the fluoroscopic device): 1,209 mGy Kerma. COMPLICATIONS: None immediate. TECHNIQUE: Informed written consent was obtained from the patient after a thorough discussion of the procedural risks, benefits and alternatives. All questions were addressed. Maximal Sterile Barrier Technique was utilized including caps, mask, sterile gowns, sterile gloves, sterile drape, hand hygiene and skin antiseptic. A timeout was performed prior to the initiation of the procedure. The right groin was prepped and draped in the usual sterile fashion. Using a micropuncture kit and the modified Seldinger technique, access was gained to the right common femoral artery and a 6 French sheath was placed. Real-time ultrasound guidance was utilized for vascular access including the acquisition of a permanent ultrasound image documenting patency of the accessed vessel. Next,  using a micropuncture kit and the modified Seldinger technique, access was gained to the right common femoral vein and an 8 French sheath was placed. Real-time ultrasound guidance was utilized for vascular access including the acquisition of a permanent ultrasound image documenting patency of the accessed vessel. Through the arterial access and under fluoroscopy, a 4 [place] Berenstein 2 catheter was navigated over a 0.035" Terumo Glidewire into the aortic arch. The catheter was placed into the right common carotid artery and then advanced into the right internal carotid artery. Frontal and lateral angiograms of the head were obtained. The catheter was then retracted into the right external carotid artery and under fluoroscopic guidance, advanced into the left posterior auricular artery. Frontal and lateral angiograms of the head were obtained. The catheter was then placed into the internal maxillary artery. Frontal and lateral angiograms of the head were obtained. Finally, the catheter was placed into the left occipital artery. Frontal and lateral angiograms of the head were obtained. Through venous access and under fluoroscopy, an 8 [place] Neuron Max guide catheter was navigated over a 6 [place] Berenstein 2 catheter and a 0.035" Terumo Glidewire into the superior vena cava. The catheter was placed into the right internal jugular vein and then advanced into the right jugular bulb. The diagnostic catheter was removed. Frontal and lateral venograms of the skull base were obtained. FINDINGS: 1. Normal caliber of the right common femoral artery, adequate for vascular access. 2. Normal caliber of the right common femoral artery, adequate for vascular access. 3. Right internal carotid artery angiograms showed brisk contrast opacification of the right MCA and ACA vascular trees a right sigmoid sinus fistula is seen supplied by the lateral tentorial artery. Physiological venous drainage through the left transverse/sigmoid  sinus with fistula draining into the right sigmoid sinus. 4. Right posterior auricular artery angiograms showed dural AV fistula supplied by posterior auricular artery branches at the right transverse-sigmoid junction with retrograde flow into the contralateral transverse sinus. 5. Right internal maxillary artery angiograms showed dural AV fistula supplied by petrous and petrosquamous branches for of the right middle meningeal artery in draining into the transverse-sigmoid sinus junction noting retrograde flow into the left transverse sinus. 6. Right occipital artery angiograms showed dural AV fistula supplied by branches of the occipital artery draining into the right transverse-sigmoid junction as well as in the sigmoid sinus near the jugular bulb. Retrograde flow into the torcular, proximal superior sagittal sinus and left transverse sinus are noted. 7. There appear to be a separate channel of the right sigmoid sinus draining the fistula that is partially separated from the main sinus lumen. 8.  Right jugular bulb venogram confirms location of the catheter within the jugular bulb with rapid washout. Prominent posterior cervical ectatic veins are noted. PROCEDURE: Through the arterial catheter placed in the right occipital artery, magnified frontal and lateral views of the head were obtained centered on the posterior fossa. Images were utilized as roadmap. Through the neuron max, a scepter balloon catheter was navigated over an Aristotle 14 micro guidewire into the right sigmoid sinus. Except the balloon was inflated and angiograms were obtained via right occipital artery contrast injection with frontal and lateral views the working projections. Angiogram confirmed the presence of 2 separate venous sinus channels with fistula draining into the smaller, most posteriorly located channel. Using biplane roadmap, an SL 10 microcatheter was navigated over a synchro support microguidewire into the right sigmoid posterior  channel. Magnified angiograms with frontal and lateral views confirm the presence of the microcatheter within the separate channel. There are, coil embolization of this channel was performed via SL 10 microcatheter. Control angiograms were obtained after coil placement. A 3 mm x 6 cm 3D Axium coil, a 5 mm x 15 cm 3 Axium coil, a 2 mm x 8 cm helix Axium Prime coil, a 2.5 mm x 6 cm 3D Axium Prime coil, a 3 mm x 10 cm helix Axium Prime coil, a 3 mm x 10 cm you leaks Axium Prime coil, a 5 mm x 15 cm 3D Axium coil, a 6 mm x 20 cm 3D Axium coil, a 6 mm x 20 cm 3D Axium coil, a 7 mm x 30 cm 3D Axium Prime coil and a 5 mm x 20 cm 3D Axium Prime coil were implanted. An attempt to implant a 5 mm x 20 cm 3D Axium Prime coil resulted in coil stretching. This coil with the corresponding microcatheter were then retracted and removed. Then, using biplane roadmap guidance, a headway duo was navigated over a synchro 2 micro guidewire into the right sigmoid sinus posterior pouch where it coiling was greater the performed as described above. Additional coils were placed followed by control angiograms obtained via right occipital artery contrast injection with frontal and lateral views of the head. A 2 mm x 4 cm helix Axium Prime coil, a 3 mm x 8 cm 3D Axium Prime coil, a 2.5 mm x 4 cm 3D Axium Prime coil and a 2 mm x 3 cm helix Axium Prime coil were implanted. Right occipital artery angiograms with magnified frontal and lateral views of the head no longer shows early venous drainage into the right sigmoid sinus. Faint delayed contrast opacification at the transverse-sigmoid sinus is noted which appear to represent minimal residual fistula, likely representing less than 5% of the original fistula. The Berenstein 2 catheter was placed into the right internal maxillary artery. Angiogram showed no opacification of the right middle meningeal artery, except for its origin, likely related to stasis. Then, the catheter was placed into the  right internal carotid artery angiograms show brisk opacification of the right ACA and MCA vascular tree without evidence of thromboembolic complication. No early opacification of the right sigmoid sinus seen via lateral tentorial artery which appear decreased in caliber compared to pre embolization angiogram. Physiological anterograde flow through the right transverse and sigmoid sinus is seen. The catheter was subsequently retracted into the innominate artery and advanced into the right subclavian artery and then into the cervical right vertebral artery. Frontal and lateral angiograms of the head were obtained. Early opacification of the right sigmoid sinus via posterior meningeal  artery described on prior angiogram is no longer seen. Physiological anterograde flow through the right transverse and sigmoid sinus is seen. The arterial and venous catheters were subsequently withdrawn. Flat panel CT of the head was obtained and post processed in a separate workstation with concurrent attending physician supervision. Selected images were sent to PACS. No evidence of hemorrhagic complication noted. Right common femoral artery angiogram was obtained in right anterior oblique view. The puncture is at the level of the common femoral artery. The artery has normal caliber, adequate for closure device. A 5 [place] Exoseal was utilized for arterial access closure. The venous sheath was removed. Manual pressure was held over the arterial and venous access site for approximately 20 minutes. Adequate hemostasis was achieved. IMPRESSION: 1. 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 Minimal residual fistula may be present at the transverse-sigmoid junction with delayed venous opacification, likely representing less than 5% of original fistula. 2. No evidence of thromboembolic  or hemorrhagic complication on final angiograms and flat panel CT. PLAN: Patient transferred to ICU for overnight observation. Electronically Signed   By: Pedro Earls M.D.   On: 10/13/2021 16:50   IR ANGIO VERTEBRAL SEL VERTEBRAL BILAT MOD SED  Result Date: 09/24/2021 INDICATION: 41 year old female with past medical history significant for chronic headache, IBS and thyroid disease presenting with pulsatile tinnitus. MR angiogram performed September 10, 2021 was suggestive of a right transverse sinus dural AV fistula. She comes to our service today for a diagnostic cerebral angiogram to evaluate MRI findings. EXAM: ULTRASOUND-GUIDED VASCULAR ACCESS DIAGNOSTIC CEREBRAL ANGIOGRAM COMPARISON:  MRI/MR angiogram of the head and neck September 10, 2021. MEDICATIONS: 5,000 IU heparin, 5 mg Verapamil and 400 mcg nitroglycerin ANESTHESIA/SEDATION: Moderate (conscious) sedation was employed during this procedure. A total of Versed 1 mg and Fentanyl 25 mcg was administered intravenously by the radiology nurse. Total intra-service moderate Sedation Time: 67 minutes. The patient's level of consciousness and vital signs were monitored continuously by radiology nursing throughout the procedure under my direct supervision. CONTRAST:  90 mL of Omnipaque 300 milligram/mL FLUOROSCOPY TIME:  Fluoroscopy Time: 14 minutes 36 seconds (781 mGy). COMPLICATIONS: None immediate. TECHNIQUE: Informed written consent was obtained from the patient after a thorough discussion of the procedural risks, benefits and alternatives. All questions were addressed. Maximal Sterile Barrier Technique was utilized including caps, mask, sterile gowns, sterile gloves, sterile drape, hand hygiene and skin antiseptic. A timeout was performed prior to the initiation of the procedure. Using the modified Seldinger technique and a micropuncture kit, access was gained to the right radial artery at the wrist and a 5 [place] sheath was placed. Real-time  ultrasound guidance was utilized for vascular access including the acquisition of a permanent ultrasound image documenting patency of the accessed vessel. Slow intra arterial infusion of 5,000 IU heparin, 5 mg Verapamil and 200 mcg nitroglycerin diluted in patient's own blood was performed. No significant fluctuation in patient's blood pressure seen. Then, a right radial artery angiogram was obtained via sheath side port. Normal brachial artery branching pattern seen. No significant anatomical variation. The right radial artery caliber is adequate for vascular access. Next, a 5 [place] Simmons 2 glide catheter was navigated over a 0.035" Terumo Glidewire into the right subclavian artery under fluoroscopic guidance. Using roadmap guidance, the catheter was advanced into the right vertebral artery. Frontal and lateral angiograms of the head were obtained followed by  magnified right anterior oblique and magnified lateral views of the skull base. The catheter was then navigated into the aortic arch where the catheter tip was reformed. The catheter was then placed into the left common carotid artery. Frontal and lateral angiograms of the neck were obtained. Using biplane roadmap, the catheter was navigated into the left internal carotid artery. Frontal and lateral angiograms of the head were obtained. The catheter was then retracted into the left common carotid artery. Using biplane roadmap, the catheter was advanced into the left external carotid artery. Frontal and lateral angiograms of the head were obtained. Next, the catheter was placed into the left subclavian artery. Using roadmap guidance, the catheter was placed into the left vertebral artery. Frontal and lateral angiograms of the head were obtained. The catheter was then placed into the right common carotid artery. Frontal and lateral angiograms of the neck were obtained. Using biplane roadmap, the catheter was advanced into the right internal carotid artery.  Frontal and lateral angiograms of the head were obtained. The catheter was retracted into the right common carotid artery and under biplane roadmap the catheter was advanced into the right internal maxillary artery. Frontal, lateral and bilateral magnified oblique views of the head obtained. Then, the catheter was retracted into the proximal right external carotid artery. Frontal and lateral angiograms of the head were obtained. The catheter was then retracted to the level of the right occipital artery. Frontal and lateral angiograms of the head were obtained. The catheter was retracted into the mid right radial artery. 200 mcg of nitroglycerin was injected 4 vasospasm treatment. The catheter was subsequently withdrawn. An inflatable band was placed and inflated over the right wrist access site. The vascular sheath was withdrawn and the band was slowly deflated until brisk flow was noted through the arteriotomy site. At this point, the band was reinflated with additional 3 cc of air to obtain patent hemostasis. FINDINGS: Right radial artery ultrasound and right radial artery angiogram: The caliber of the distal right radial artery is appropriate for angiogram access. The right radial artery and the right ulnar artery have normal course and caliber. No significant anatomical variants noted. Right vertebral artery angiograms: The right vertebral artery, basilar artery, and bilateral posterior cerebral arteries are unremarkable. Luminal caliber is smooth and tapering. No aneurysms are seen. 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 Visualized dural sinuses are patent. Left CCA angiograms: Cervical angiograms show normal course and caliber of the visualized left common carotid and internal carotid arteries. There are no significant stenoses. Left ICA angiograms: There is brisk vascular contrast filling of the left ACA and MCA vascular trees. Luminal caliber  is smooth and tapering. No aneurysms or abnormally high-flow, early draining veins are seen. No regions of abnormal hypervascularity are noted. No venous drainage in the to the right transverse or sigmoid sinus. The left transverse and sigmoid sinuses are widely patent. Left ECA angiograms: No early venous drainage was noted. The visualized branches of the left external carotid artery are unremarkable. Left vertebral artery angiograms: The left vertebral artery, basilar artery, and bilateral posterior cerebral arteries are unremarkable. Luminal caliber is smooth and tapering. No aneurysms are seen. Right transverse/sigmoid sinus dural AV fistula in supplied by right posterior meningeal artery as well as right AICA and SCA branches. Normal venous drainage is for [place] through the left transverse and sigmoid sinus. Right CCA angiograms: Cervical angiograms show normal course and caliber of the visualized  right common carotid and internal carotid arteries. There are no significant stenoses. Right ICA angiograms: There is brisk vascular contrast filling of the right ACA and MCA vascular trees. Luminal caliber is smooth and tapering. No aneurysms are seen. No regions of abnormal hypervascularity are noted. Faint early opacification of the right transverse sinus is seen via prominent artery of Bernasconi and Cassinari. Preferential interior drainage into the sphenoid parietal sinus and posteriorly into the left transverse and sigmoid sinus. Right ECA angiograms: Prominent dural AV fistula seen in the left transverse/sigmoid sinus supplied by branches of the right middle meningeal artery, posterior ring level artery and occipital artery. The venous drainage is anterograde. No evidence of cortical venous reflux. PROCEDURE: No intervention performed. 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 Venous drainage remains anterograde with no evidence of cortical venous reflux. Physiological venous drainage via left transverse and sigmoid sinus as well as bilateral sphenoparietal sinuses. PLAN: Patient will return for office consultation to discuss dural AV fistula management. Electronically Signed   By: Pedro Earls M.D.   On: 09/24/2021 17:23   IR ANGIO EXTERNAL CAROTID SEL EXT CAROTID BILAT MOD SED  Result Date: 09/24/2021 INDICATION: 41 year old female with past medical history significant for chronic headache, IBS and thyroid disease presenting with pulsatile tinnitus. MR angiogram performed September 10, 2021 was suggestive of a right transverse sinus dural AV fistula. She comes to our service today for a diagnostic cerebral angiogram to evaluate MRI findings. EXAM: ULTRASOUND-GUIDED VASCULAR ACCESS DIAGNOSTIC CEREBRAL ANGIOGRAM COMPARISON:  MRI/MR angiogram of the head and neck September 10, 2021. MEDICATIONS: 5,000 IU heparin, 5 mg Verapamil and 400 mcg nitroglycerin ANESTHESIA/SEDATION: Moderate (conscious) sedation was employed during this procedure. A total of Versed 1 mg and Fentanyl 25 mcg was administered intravenously by the radiology nurse. Total intra-service moderate Sedation Time: 67 minutes. The patient's level of consciousness and vital signs were monitored continuously by radiology nursing throughout the procedure under my direct supervision. CONTRAST:  90 mL of Omnipaque 300 milligram/mL FLUOROSCOPY TIME:  Fluoroscopy Time: 14 minutes 36 seconds (781 mGy). COMPLICATIONS: None immediate. TECHNIQUE: Informed written consent was obtained from the patient after a thorough discussion of the procedural risks, benefits and alternatives. All questions were addressed. Maximal Sterile Barrier Technique was utilized including caps, mask, sterile gowns, sterile gloves, sterile drape, hand hygiene and skin antiseptic. A timeout was  performed prior to the initiation of the procedure. Using the modified Seldinger technique and a micropuncture kit, access was gained to the right radial artery at the wrist and a 5 French sheath was placed. Real-time ultrasound guidance was utilized for vascular access including the acquisition of a permanent ultrasound image documenting patency of the accessed vessel. Slow intra arterial infusion of 5,000 IU heparin, 5 mg Verapamil and 200 mcg nitroglycerin diluted in patient's own blood was performed. No significant fluctuation in patient's blood pressure seen. Then, a right radial artery angiogram was obtained via sheath side port. Normal brachial artery branching pattern seen. No significant anatomical variation. The right radial artery caliber is adequate for vascular access. Next, a 5 [place] Simmons 2 glide catheter was navigated over a 0.035" Terumo Glidewire into the right subclavian artery under fluoroscopic guidance. Using roadmap guidance, the catheter was advanced into the right vertebral artery. Frontal and lateral angiograms of the head were obtained followed by magnified right anterior oblique  and magnified lateral views of the skull base. The catheter was then navigated into the aortic arch where the catheter tip was reformed. The catheter was then placed into the left common carotid artery. Frontal and lateral angiograms of the neck were obtained. Using biplane roadmap, the catheter was navigated into the left internal carotid artery. Frontal and lateral angiograms of the head were obtained. The catheter was then retracted into the left common carotid artery. Using biplane roadmap, the catheter was advanced into the left external carotid artery. Frontal and lateral angiograms of the head were obtained. Next, the catheter was placed into the left subclavian artery. Using roadmap guidance, the catheter was placed into the left vertebral artery. Frontal and lateral angiograms of the head were  obtained. The catheter was then placed into the right common carotid artery. Frontal and lateral angiograms of the neck were obtained. Using biplane roadmap, the catheter was advanced into the right internal carotid artery. Frontal and lateral angiograms of the head were obtained. The catheter was retracted into the right common carotid artery and under biplane roadmap the catheter was advanced into the right internal maxillary artery. Frontal, lateral and bilateral magnified oblique views of the head obtained. Then, the catheter was retracted into the proximal right external carotid artery. Frontal and lateral angiograms of the head were obtained. The catheter was then retracted to the level of the right occipital artery. Frontal and lateral angiograms of the head were obtained. The catheter was retracted into the mid right radial artery. 200 mcg of nitroglycerin was injected 4 vasospasm treatment. The catheter was subsequently withdrawn. An inflatable band was placed and inflated over the right wrist access site. The vascular sheath was withdrawn and the band was slowly deflated until brisk flow was noted through the arteriotomy site. At this point, the band was reinflated with additional 3 cc of air to obtain patent hemostasis. FINDINGS: Right radial artery ultrasound and right radial artery angiogram: The caliber of the distal right radial artery is appropriate for angiogram access. The right radial artery and the right ulnar artery have normal course and caliber. No significant anatomical variants noted. Right vertebral artery angiograms: The right vertebral artery, basilar artery, and bilateral posterior cerebral arteries are unremarkable. Luminal caliber is smooth and tapering. No aneurysms are seen. 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 Visualized dural sinuses are patent. Left CCA angiograms: Cervical angiograms show normal course and  caliber of the visualized left common carotid and internal carotid arteries. There are no significant stenoses. Left ICA angiograms: There is brisk vascular contrast filling of the left ACA and MCA vascular trees. Luminal caliber is smooth and tapering. No aneurysms or abnormally high-flow, early draining veins are seen. No regions of abnormal hypervascularity are noted. No venous drainage in the to the right transverse or sigmoid sinus. The left transverse and sigmoid sinuses are widely patent. Left ECA angiograms: No early venous drainage was noted. The visualized branches of the left external carotid artery are unremarkable. Left vertebral artery angiograms: The left vertebral artery, basilar artery, and bilateral posterior cerebral arteries are unremarkable. Luminal caliber is smooth and tapering. No aneurysms are seen. Right transverse/sigmoid sinus dural AV fistula in supplied by right posterior meningeal artery as well as right AICA and SCA branches. Normal venous drainage is for [place] through the left transverse and sigmoid sinus. Right CCA angiograms: Cervical angiograms show normal course and caliber of the visualized right common carotid and  internal carotid arteries. There are no significant stenoses. Right ICA angiograms: There is brisk vascular contrast filling of the right ACA and MCA vascular trees. Luminal caliber is smooth and tapering. No aneurysms are seen. No regions of abnormal hypervascularity are noted. Faint early opacification of the right transverse sinus is seen via prominent artery of Bernasconi and Cassinari. Preferential interior drainage into the sphenoid parietal sinus and posteriorly into the left transverse and sigmoid sinus. Right ECA angiograms: Prominent dural AV fistula seen in the left transverse/sigmoid sinus supplied by branches of the right middle meningeal artery, posterior ring level artery and occipital artery. The venous drainage is anterograde. No evidence of  cortical venous reflux. PROCEDURE: No intervention performed. 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 Venous drainage remains anterograde with no evidence of cortical venous reflux. Physiological venous drainage via left transverse and sigmoid sinus as well as bilateral sphenoparietal sinuses. PLAN: Patient will return for office consultation to discuss dural AV fistula management. Electronically Signed   By: Pedro Earls M.D.   On: 09/24/2021 17:23   IR NEURO EACH ADD'L AFTER BASIC UNI RIGHT (MS)  Result Date: 10/13/2021 INDICATION: 41 year old female with past medical history significant for chronic headache, IBS and thyroid disease presenting with pulsatile tinnitus with significant impact in her quality of life. MR angiogram performed September 10, 2021 was suggestive of a right transverse/sigmoid sinus dural AV fistula. 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 EXAM: ULTRASOUND-GUIDED VASCULAR ACCESS DIAGNOSTIC CEREBRAL ANGIOGRAM ENDOVASCULAR EMBOLIZATION OF DURAL ARTERIOVENOUS FISTULA FLAT PANEL HEAD CT COMPARISON:  Cerebral angiogram September 24, 2021. MEDICATIONS: Vancomycin 1 gm IV. The antibiotic was administered within 1 hour of the procedure. ANESTHESIA/SEDATION: The procedure was performed under general anesthesia. CONTRAST:  125 mL Omnipaque 300 milligram/mL. FLUOROSCOPY: Radiation Exposure Index (as provided by the fluoroscopic device): 1,209 mGy Kerma. COMPLICATIONS: None immediate. TECHNIQUE: Informed written consent was obtained from the patient after a thorough discussion of the procedural risks, benefits and alternatives. All questions were addressed. Maximal Sterile Barrier Technique was utilized including caps,  mask, sterile gowns, sterile gloves, sterile drape, hand hygiene and skin antiseptic. A timeout was performed prior to the initiation of the procedure. The right groin was prepped and draped in the usual sterile fashion. Using a micropuncture kit and the modified Seldinger technique, access was gained to the right common femoral artery and a 6 French sheath was placed. Real-time ultrasound guidance was utilized for vascular access including the acquisition of a permanent ultrasound image documenting patency of the accessed vessel. Next, using a micropuncture kit and the modified Seldinger technique, access was gained to the right common femoral vein and an 8 French sheath was placed. Real-time ultrasound guidance was utilized for vascular access including the acquisition of a permanent ultrasound image documenting patency of the accessed vessel. Through the arterial access and under fluoroscopy, a 4 [place] Berenstein 2 catheter was navigated over a 0.035" Terumo Glidewire into the aortic arch. The catheter was placed into the right common carotid artery and then advanced into the right internal carotid artery. Frontal and lateral angiograms of the head were obtained. The catheter was then retracted into the right external carotid artery and under fluoroscopic guidance, advanced into the left posterior auricular artery. Frontal and lateral angiograms of the head  were obtained. The catheter was then placed into the internal maxillary artery. Frontal and lateral angiograms of the head were obtained. Finally, the catheter was placed into the left occipital artery. Frontal and lateral angiograms of the head were obtained. Through venous access and under fluoroscopy, an 8 [place] Neuron Max guide catheter was navigated over a 6 [place] Berenstein 2 catheter and a 0.035" Terumo Glidewire into the superior vena cava. The catheter was placed into the right internal jugular vein and then advanced into the right jugular bulb.  The diagnostic catheter was removed. Frontal and lateral venograms of the skull base were obtained. FINDINGS: 1. Normal caliber of the right common femoral artery, adequate for vascular access. 2. Normal caliber of the right common femoral artery, adequate for vascular access. 3. Right internal carotid artery angiograms showed brisk contrast opacification of the right MCA and ACA vascular trees a right sigmoid sinus fistula is seen supplied by the lateral tentorial artery. Physiological venous drainage through the left transverse/sigmoid sinus with fistula draining into the right sigmoid sinus. 4. Right posterior auricular artery angiograms showed dural AV fistula supplied by posterior auricular artery branches at the right transverse-sigmoid junction with retrograde flow into the contralateral transverse sinus. 5. Right internal maxillary artery angiograms showed dural AV fistula supplied by petrous and petrosquamous branches for of the right middle meningeal artery in draining into the transverse-sigmoid sinus junction noting retrograde flow into the left transverse sinus. 6. Right occipital artery angiograms showed dural AV fistula supplied by branches of the occipital artery draining into the right transverse-sigmoid junction as well as in the sigmoid sinus near the jugular bulb. Retrograde flow into the torcular, proximal superior sagittal sinus and left transverse sinus are noted. 7. There appear to be a separate channel of the right sigmoid sinus draining the fistula that is partially separated from the main sinus lumen. 8. Right jugular bulb venogram confirms location of the catheter within the jugular bulb with rapid washout. Prominent posterior cervical ectatic veins are noted. PROCEDURE: Through the arterial catheter placed in the right occipital artery, magnified frontal and lateral views of the head were obtained centered on the posterior fossa. Images were utilized as roadmap. Through the neuron max, a  scepter balloon catheter was navigated over an Aristotle 14 micro guidewire into the right sigmoid sinus. Except the balloon was inflated and angiograms were obtained via right occipital artery contrast injection with frontal and lateral views the working projections. Angiogram confirmed the presence of 2 separate venous sinus channels with fistula draining into the smaller, most posteriorly located channel. Using biplane roadmap, an SL 10 microcatheter was navigated over a synchro support microguidewire into the right sigmoid posterior channel. Magnified angiograms with frontal and lateral views confirm the presence of the microcatheter within the separate channel. There are, coil embolization of this channel was performed via SL 10 microcatheter. Control angiograms were obtained after coil placement. A 3 mm x 6 cm 3D Axium coil, a 5 mm x 15 cm 3 Axium coil, a 2 mm x 8 cm helix Axium Prime coil, a 2.5 mm x 6 cm 3D Axium Prime coil, a 3 mm x 10 cm helix Axium Prime coil, a 3 mm x 10 cm you leaks Axium Prime coil, a 5 mm x 15 cm 3D Axium coil, a 6 mm x 20 cm 3D Axium coil, a 6 mm x 20 cm 3D Axium coil, a 7 mm x 30 cm 3D Axium Prime coil and a 5 mm x 20 cm  3D Axium Prime coil were implanted. An attempt to implant a 5 mm x 20 cm 3D Axium Prime coil resulted in coil stretching. This coil with the corresponding microcatheter were then retracted and removed. Then, using biplane roadmap guidance, a headway duo was navigated over a synchro 2 micro guidewire into the right sigmoid sinus posterior pouch where it coiling was greater the performed as described above. Additional coils were placed followed by control angiograms obtained via right occipital artery contrast injection with frontal and lateral views of the head. A 2 mm x 4 cm helix Axium Prime coil, a 3 mm x 8 cm 3D Axium Prime coil, a 2.5 mm x 4 cm 3D Axium Prime coil and a 2 mm x 3 cm helix Axium Prime coil were implanted. Right occipital artery angiograms with  magnified frontal and lateral views of the head no longer shows early venous drainage into the right sigmoid sinus. Faint delayed contrast opacification at the transverse-sigmoid sinus is noted which appear to represent minimal residual fistula, likely representing less than 5% of the original fistula. The Berenstein 2 catheter was placed into the right internal maxillary artery. Angiogram showed no opacification of the right middle meningeal artery, except for its origin, likely related to stasis. Then, the catheter was placed into the right internal carotid artery angiograms show brisk opacification of the right ACA and MCA vascular tree without evidence of thromboembolic complication. No early opacification of the right sigmoid sinus seen via lateral tentorial artery which appear decreased in caliber compared to pre embolization angiogram. Physiological anterograde flow through the right transverse and sigmoid sinus is seen. The catheter was subsequently retracted into the innominate artery and advanced into the right subclavian artery and then into the cervical right vertebral artery. Frontal and lateral angiograms of the head were obtained. Early opacification of the right sigmoid sinus via posterior meningeal artery described on prior angiogram is no longer seen. Physiological anterograde flow through the right transverse and sigmoid sinus is seen. The arterial and venous catheters were subsequently withdrawn. Flat panel CT of the head was obtained and post processed in a separate workstation with concurrent attending physician supervision. Selected images were sent to PACS. No evidence of hemorrhagic complication noted. Right common femoral artery angiogram was obtained in right anterior oblique view. The puncture is at the level of the common femoral artery. The artery has normal caliber, adequate for closure device. A 5 [place] Exoseal was utilized for arterial access closure. The venous sheath was removed.  Manual pressure was held over the arterial and venous access site for approximately 20 minutes. Adequate hemostasis was achieved. IMPRESSION: 1. 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 Minimal residual fistula may be present at the transverse-sigmoid junction with delayed venous opacification, likely representing less than 5% of original fistula. 2. No evidence of thromboembolic or hemorrhagic complication on final angiograms and flat panel CT. PLAN: Patient transferred to ICU for overnight observation. Electronically Signed   By: Pedro Earls M.D.   On: 10/13/2021 16:50    Treatments: Observation; pain management.   Discharge Exam: Blood pressure 119/82, pulse 81, temperature 98.5 F (36.9 C), temperature source Oral, resp. rate 20, height 5' 0.5" (1.537 m), weight 102 lb 8 oz (46.5 kg), SpO2 100 %. Physical Exam Constitutional:      General: She is not in acute distress.    Appearance: She  is not ill-appearing.  Cardiovascular:     Comments: Right groin vascular site is clean, soft and dry. Mild tenderness to palpation. Left wrist a-line site (a-line removed) is clean and dry.  Pulmonary:     Effort: Pulmonary effort is normal.  Skin:    General: Skin is warm and dry.  Neurological:     Mental Status: She is alert and oriented to person, place, and time.     Cranial Nerves: No cranial nerve deficit, dysarthria or facial asymmetry.     Motor: No weakness.    Disposition: Discharge disposition: 01-Home or Self Care    Allergies as of 10/14/2021       Reactions   Erythromycin Nausea And Vomiting   Penicillins Rash        Medication List     STOP taking these medications    ibuprofen 200 MG tablet Commonly known as: ADVIL       TAKE these medications    albuterol 108 (90 Base) MCG/ACT  inhaler Commonly known as: VENTOLIN HFA Inhale 2 puffs into the lungs every 6 (six) hours as needed for wheezing or shortness of breath.   ALPRAZolam 0.5 MG tablet Commonly known as: XANAX Take 0.5 mg by mouth 3 (three) times daily as needed for anxiety.   cetirizine 10 MG tablet Commonly known as: ZYRTEC Take 10 mg by mouth daily as needed for allergies.   cholecalciferol 25 MCG (1000 UNIT) tablet Commonly known as: VITAMIN D3 Take 1,000 Units by mouth daily.   cyproheptadine 4 MG tablet Commonly known as: PERIACTIN Take 4 mg by mouth at bedtime as needed for allergies.   levonorgestrel 20 MCG/24HR IUD Commonly known as: MIRENA 1 each by Intrauterine route once.   MELATONIN-MAGNESIUM CITRATE PO Take 1 Dose by mouth at bedtime as needed (sleep). 1 dose = 2 teaspoons   methylPREDNISolone 4 MG Tbpk tablet Commonly known as: MEDROL DOSEPAK Take as prescribed per instructions on box   thyroid 120 MG tablet Commonly known as: ARMOUR Take 120 mg by mouth daily.   tretinoin 0.025 % cream Commonly known as: RETIN-A Apply 1 application topically at bedtime.   valACYclovir 1000 MG tablet Commonly known as: VALTREX Take 1,000 mg by mouth 2 (two) times daily as needed (fever blisters).        Follow-up Information     [facility] Follow up.   Why: Please follow up with Dr. Karenann Cai in 6 months. A scheduler from our office will call you with a date and time. Please call our office for an earlier appointment if you have any concerning symptoms such as worsening headache, dizziness, weakness. Contact information: [place] [address] [phone]                  Electronically Signed: Theresa Duty, NP 10/14/2021, 10:56 AM   I have spent Less Than 30 Minutes discharging [facility].

## 2021-10-14 NOTE — Discharge Instructions (Signed)
Ok to take 200 mg ibuprofen and/or 325 mg Tylenol every 4-6 hours as needed for headache. Ok to remove right groin dressing and shower tonight. Please do not submerge the site in water for one week. Avoid bending, stooping or lifting greater than 10 lb for one week. If you experience worsening headache unresponsive to pain medication or if you experience any other concerning symptoms such as dizziness, weakness, or blurred vision please call our office for further guidance or go to the Emergency room depending on the severity of your symptoms.

## 2021-10-14 NOTE — Progress Notes (Signed)
°  Transition of Care [facility]) Screening Note   Patient Details  Name: Cassandra Jefferson Date of Birth: May 26, 1981   Transition of Care [facility] CM/SW Contact:    Mearl Latin, LCSW Phone Number: 10/14/2021, 9:32 AM    Transition of Care Department [facility]) has reviewed patient and no TOC needs have been identified at this time. We will continue to monitor patient advancement through interdisciplinary progression rounds. If new patient transition needs arise, please place a TOC consult.

## 2021-10-14 NOTE — Plan of Care (Signed)
Patient adequate for D/C home this AM. All discharge instructions reviewed with patient by Dr Tommi Rumps Melchor Amour and again by me with patient and mother and law at bedside. IVs removed, VSS. Discussion about headache and low dose advil if needed, also covered in discharge paperwork. Patient voices no concerns, discharged home with mother in law.

## 2021-10-18 NOTE — Anesthesia Postprocedure Evaluation (Signed)
Anesthesia Post Note  Patient: Cassandra Jefferson  Procedure(s) Performed: EMBOLIZATION     Patient location during evaluation: PACU Anesthesia Type: General Level of consciousness: awake and alert Pain management: pain level controlled Vital Signs Assessment: post-procedure vital signs reviewed and stable Respiratory status: spontaneous breathing, nonlabored ventilation, respiratory function stable and patient connected to nasal cannula oxygen Cardiovascular status: blood pressure returned to baseline and stable Postop Assessment: no apparent nausea or vomiting Anesthetic complications: no   No notable events documented.  Last Vitals:  Vitals:   10/14/21 0900 10/14/21 1000  BP: 120/83 119/82  Pulse: 75 81  Resp: 19 20  Temp:    SpO2: 98% 100%    Last Pain:  Vitals:   10/14/21 0855  TempSrc:   PainSc: 4                  Ora Bollig

## 2022-04-08 ENCOUNTER — Other Ambulatory Visit: Payer: Self-pay | Admitting: Orthopedic Surgery

## 2022-04-08 DIAGNOSIS — M25561 Pain in right knee: Secondary | ICD-10-CM

## 2022-04-08 DIAGNOSIS — M2351 Chronic instability of knee, right knee: Secondary | ICD-10-CM

## 2022-04-26 ENCOUNTER — Ambulatory Visit
Admission: RE | Admit: 2022-04-26 | Discharge: 2022-04-26 | Disposition: A | Discharge status: Home / Self Care | Payer: BC Managed Care – PPO | Source: Ambulatory Visit | Attending: Orthopedic Surgery | Admitting: Orthopedic Surgery

## 2022-04-26 DIAGNOSIS — M2351 Chronic instability of knee, right knee: Secondary | ICD-10-CM

## 2022-04-26 DIAGNOSIS — M25561 Pain in right knee: Secondary | ICD-10-CM

## 2022-04-28 ENCOUNTER — Telehealth (HOSPITAL_COMMUNITY): Payer: Self-pay

## 2022-04-28 NOTE — Telephone Encounter (Signed)
Called to schedule diagnostic angiogram, no answer, left vm. AW  

## 2022-05-11 ENCOUNTER — Telehealth (HOSPITAL_COMMUNITY): Payer: Self-pay

## 2022-05-11 NOTE — Telephone Encounter (Signed)
Called to schedule diagnostic angiogram, no answer, left vm. AW
